# Patient Record
Sex: Female | Born: 1945
Health system: Southern US, Community
[De-identification: ages and names within clinical notes are randomized; demographics above are authoritative.]

## PROBLEM LIST (undated history)

## (undated) DIAGNOSIS — E039 Hypothyroidism, unspecified: Secondary | ICD-10-CM

## (undated) DIAGNOSIS — I499 Cardiac arrhythmia, unspecified: Secondary | ICD-10-CM

## (undated) DIAGNOSIS — I4891 Unspecified atrial fibrillation: Secondary | ICD-10-CM

## (undated) DIAGNOSIS — F329 Major depressive disorder, single episode, unspecified: Secondary | ICD-10-CM

## (undated) DIAGNOSIS — F411 Generalized anxiety disorder: Secondary | ICD-10-CM

## (undated) DIAGNOSIS — I10 Essential (primary) hypertension: Secondary | ICD-10-CM

## (undated) DIAGNOSIS — R0989 Other specified symptoms and signs involving the circulatory and respiratory systems: Secondary | ICD-10-CM

## (undated) DIAGNOSIS — E119 Type 2 diabetes mellitus without complications: Secondary | ICD-10-CM

## (undated) DIAGNOSIS — J40 Bronchitis, not specified as acute or chronic: Secondary | ICD-10-CM

## (undated) DIAGNOSIS — J449 Chronic obstructive pulmonary disease, unspecified: Secondary | ICD-10-CM

## (undated) DIAGNOSIS — F419 Anxiety disorder, unspecified: Secondary | ICD-10-CM

## (undated) DIAGNOSIS — M1990 Unspecified osteoarthritis, unspecified site: Secondary | ICD-10-CM

## (undated) DIAGNOSIS — H269 Unspecified cataract: Secondary | ICD-10-CM

## (undated) DIAGNOSIS — I509 Heart failure, unspecified: Secondary | ICD-10-CM

## (undated) DIAGNOSIS — G894 Chronic pain syndrome: Secondary | ICD-10-CM

## (undated) DIAGNOSIS — F32A Depression, unspecified: Secondary | ICD-10-CM

## (undated) DIAGNOSIS — R519 Headache, unspecified: Secondary | ICD-10-CM

## (undated) DIAGNOSIS — E785 Hyperlipidemia, unspecified: Secondary | ICD-10-CM

## (undated) DIAGNOSIS — Z973 Presence of spectacles and contact lenses: Secondary | ICD-10-CM

## (undated) DIAGNOSIS — N189 Chronic kidney disease, unspecified: Secondary | ICD-10-CM

## (undated) DIAGNOSIS — R0602 Shortness of breath: Secondary | ICD-10-CM

## (undated) DIAGNOSIS — Z96649 Presence of unspecified artificial hip joint: Secondary | ICD-10-CM

## (undated) DIAGNOSIS — D649 Anemia, unspecified: Secondary | ICD-10-CM

## (undated) DIAGNOSIS — K219 Gastro-esophageal reflux disease without esophagitis: Secondary | ICD-10-CM

## (undated) HISTORY — DX: Unspecified atrial fibrillation: I48.91

## (undated) HISTORY — DX: Other specified symptoms and signs involving the circulatory and respiratory systems: R09.89

## (undated) HISTORY — DX: Hyperlipidemia, unspecified: E78.5

## (undated) HISTORY — PX: BACK SURGERY: SHX140

## (undated) HISTORY — PX: COLONOSCOPY: SHX174

## (undated) HISTORY — PX: ABDOMINAL HYSTERECTOMY: SHX81

## (undated) HISTORY — PX: CHOLECYSTECTOMY: SHX55

## (undated) HISTORY — PX: NECK SURGERY: SHX720

---

## 2002-08-03 ENCOUNTER — Encounter: Payer: Self-pay | Admitting: Neurosurgery

## 2002-08-05 ENCOUNTER — Encounter: Payer: Self-pay | Admitting: Neurosurgery

## 2002-08-05 ENCOUNTER — Inpatient Hospital Stay (HOSPITAL_COMMUNITY): Admission: RE | Admit: 2002-08-05 | Discharge: 2002-08-07 | Payer: Self-pay | Admitting: Neurosurgery

## 2002-10-26 ENCOUNTER — Encounter: Payer: Self-pay | Admitting: Neurosurgery

## 2002-10-26 ENCOUNTER — Inpatient Hospital Stay (HOSPITAL_COMMUNITY): Admission: RE | Admit: 2002-10-26 | Discharge: 2002-10-27 | Payer: Self-pay | Admitting: Neurosurgery

## 2013-07-25 ENCOUNTER — Other Ambulatory Visit: Payer: Self-pay | Admitting: Orthopedic Surgery

## 2013-07-25 NOTE — Progress Notes (Signed)
Preoperative surgical orders have been place into the [facility] hospital system for Robin Brewer on 07/25/2013, 10:35 AM  by Mickel Crow for surgery on 08/12/13.  Preop Total Hip - Anterior Approach orders including Experel Injecion, PO Tylenol, and IV Decadron as long as there are no contraindications to the above medications. [name], PA-C

## 2013-07-29 ENCOUNTER — Encounter (HOSPITAL_COMMUNITY): Payer: Self-pay | Admitting: Pharmacy Technician

## 2013-08-02 ENCOUNTER — Other Ambulatory Visit: Payer: Self-pay | Admitting: Surgical

## 2013-08-02 NOTE — H&P (Signed)
TOTAL HIP ADMISSION H&P  Patient is admitted for right total hip arthroplasty.  Subjective:  Chief Complaint: right hip pain  HPI: Robin Brewer, 67 y.o. female, has a history of pain and functional disability in the right hip(s) due to arthritis and patient has failed non-surgical conservative treatments for greater than 12 weeks to include NSAID's and/or analgesics, corticosteriod injections, use of assistive devices and activity modification.  Onset of symptoms was gradual starting 3 years ago with gradually worsening course since that time.The patient noted no past surgery on the right hip(s).  Patient currently rates pain in the right hip at 6 out of 10 with activity. Patient has night pain, worsening of pain with activity and weight bearing, pain that interfers with activities of daily living and pain with passive range of motion. Patient has evidence of periarticular osteophytes and joint space narrowing by imaging studies. This condition presents safety issues increasing the risk of falls.   There is no current active infection.   Past Medical History Anxiety/Depression Hypertension Hypercholesteremia Hypothyroidism Osteoarthritis DDD Diabetes Mellitus type II Diverticulitis Peripheral Neuropathy  Past Surgical History Lumbar microdiscectomy Hysterectomy Cervical fusion   Current outpatient prescriptions: amLODipine-valsartan (EXFORGE) 10-320 MG per tablet, Take 1 tablet by mouth every evening., Disp: , Rfl: ;  atenolol-chlorthalidone (TENORETIC) 50-25 MG per tablet, Take 1 tablet by mouth every morning., Disp: , Rfl: ;  estrogens, conjugated, (PREMARIN) 0.625 MG tablet, Take 0.625 mg by mouth daily., Disp: , Rfl: ;  fenofibrate 160 MG tablet, Take 160 mg by mouth every morning., Disp: , Rfl:  gabapentin (NEURONTIN) 600 MG tablet, Take 900-1,200 mg by mouth 3 (three) times daily. TAKES 1 AND 1/2 TABLETS  2 TIMES DAILY AND 2 TABLETS AT BEDTIME, Disp: , Rfl: ;  glimepiride  (AMARYL) 4 MG tablet, Take 4 mg by mouth 2 (two) times daily., Disp: , Rfl: ;  insulin aspart (NOVOLOG) 100 UNIT/ML injection, Inject 10 Units into the skin 3 (three) times daily with meals., Disp: , Rfl:  insulin detemir (LEVEMIR) 100 UNIT/ML injection, Inject 40 Units into the skin daily., Disp: , Rfl: ;  levothyroxine (SYNTHROID, LEVOTHROID) 50 MCG tablet, Take 50 mcg by mouth daily before breakfast., Disp: , Rfl: ;  lisinopril (PRINIVIL,ZESTRIL) 40 MG tablet, Take 40 mg by mouth every evening., Disp: , Rfl: ;  oxymorphone (OPANA ER) 15 MG 12 hr tablet, Take 15 mg by mouth every 8 (eight) hours., Disp: , Rfl:  rosuvastatin (CRESTOR) 20 MG tablet, Take 20 mg by mouth every evening., Disp: , Rfl: ;  sertraline (ZOLOFT) 100 MG tablet, Take 100 mg by mouth every evening., Disp: , Rfl: ;  tiZANidine (ZANAFLEX) 4 MG tablet, Take 4-12 mg by mouth at bedtime. 1-3 TABLETS 1 HOUR PRIOR TO BEDTIME, Disp: , Rfl:   No Known Allergies  History  Substance Use Topics  . Smoking status: Former smoker  . Smokeless tobacco: None  . Alcohol Use: None    Family History Father deceased age 55 due to pulmonary hemorrhage, lung abscess, sepsis Mother deceased age due to heart disease  Review of Systems  Constitutional: Positive for malaise/fatigue and diaphoresis. Negative for fever, chills and weight loss.  HENT: Negative.  Negative for neck pain.   Eyes: Positive for blurred vision. Negative for double vision, photophobia, pain, discharge and redness.  Respiratory: Negative.   Cardiovascular: Negative.   Gastrointestinal: Positive for constipation. Negative for heartburn, nausea, vomiting, abdominal pain, diarrhea, blood in stool and melena.  Genitourinary: Positive for frequency. Negative  for dysuria, urgency, hematuria and flank pain.  Musculoskeletal: Positive for back pain and joint pain. Negative for myalgias and falls.  Skin: Negative.   Neurological: Positive for dizziness and weakness. Negative for  tingling, tremors, sensory change, speech change, focal weakness, seizures and loss of consciousness.  Endo/Heme/Allergies: Positive for environmental allergies. Negative for polydipsia. Does not bruise/bleed easily.  Psychiatric/Behavioral: Positive for depression. Negative for suicidal ideas, hallucinations, memory loss and substance abuse. The patient is nervous/anxious and has insomnia.     Objective:  Physical Exam  Constitutional: He is oriented to person, place, and time. He appears well-developed and well-nourished. No distress.  HENT:  Head: Normocephalic and atraumatic.  Right Ear: External ear normal.  Left Ear: External ear normal.  Nose: Nose normal.  Mouth/Throat: Oropharynx is clear and moist.  Eyes: Conjunctivae and EOM are normal.  Neck: Normal range of motion. Neck supple.  Cardiovascular: Normal rate, regular rhythm, normal heart sounds and intact distal pulses.   No murmur heard. Respiratory: Effort normal and breath sounds normal. No respiratory distress. He has no wheezes. He exhibits no tenderness.  GI: Soft. Bowel sounds are normal. He exhibits no distension and no mass. There is no tenderness.  Musculoskeletal:       Right hip: He exhibits decreased range of motion and decreased strength.       Left hip: Normal.       Right knee: Normal.       Left knee: Normal.       Right lower leg: He exhibits no tenderness and no swelling.       Left lower leg: He exhibits no tenderness and no swelling.  Her left hip shows normal range of motion with no discomfort. Right hip flexion 90, no internal or external rotation, no abduction or adduction. She has significant pain on any attempted range of motion of that right hip. Knee exam is normal. Sensation is slightly diminished lateral lower leg. Gait pattern is significantly antalgic on the right.  Lymphadenopathy:    He has no cervical adenopathy.  Neurological: He is oriented to person, place, and time. He has normal  strength and normal reflexes. A sensory deficit is present.  Skin: No rash noted. He is not diaphoretic. No erythema.  Psychiatric: He has a normal mood and affect. His behavior is normal.    Vitals( Weight: 170 lb Height: 62 in Body Surface Area: 1.84 m Body Mass Index: 31.09 kg/m Pulse: 72 (Regular) BP: 132/82 (Sitting, Left Arm, Standard)  Imaging Review Plain radiographs demonstrate severe degenerative joint disease of the right hip(s). The bone quality appears to be good for age and reported activity level.  Assessment/Plan:  End stage arthritis, right hip(s)  The patient history, physical examination, clinical judgement of the provider and imaging studies are consistent with end stage degenerative joint disease of the right hip(s) and total hip arthroplasty is deemed medically necessary. The treatment options including medical management, injection therapy, arthroscopy and arthroplasty were discussed at length. The risks and benefits of total hip arthroplasty were presented and reviewed. The risks due to aseptic loosening, infection, stiffness, dislocation/subluxation,  thromboembolic complications and other imponderables were discussed.  The patient acknowledged the explanation, agreed to proceed with the plan and consent was signed. Patient is being admitted for inpatient treatment for surgery, pain control, PT, OT, prophylactic antibiotics, VTE prophylaxis, progressive ambulation and ADL's and discharge planning.The patient is planning to be discharged home with home health services   [place], [place]

## 2013-08-05 NOTE — Patient Instructions (Signed)
Robin Brewer  08/05/2013   Your procedure is scheduled on: 08/12/13                Surgery 1200noon-200pm   Report to [facility] at     0930 AM.  Call this number if you have problems the morning of surgery: [phone]   Remember:   Do not eat food or drink liquids after midnight.   Take these medicines the morning of surgery with A SIP OF WATER:    Do not wear jewelry, make-up or nail polish.  Do not wear lotions, powders, or perfumes  Do not shave 48 hours prior to surgery.   Do not bring valuables to the hospital.  Contacts, dentures or bridgework may not be worn into surgery.  Leave suitcase in the car. After surgery it may be brought to your room.  For patients admitted to the hospital, checkout time is 11:00 AM the day of  discharge.     SEE CHG INSTRUCTION SHEET    Please read over the following fact sheets that you were given: MRSA Information, coughing and deep breathing exercises, leg exercises, Blood Transfusion Fact Sheet, Incentive Spirometry fact Sheet                Failure to comply with these instructions may result in cancellation of your surgery.                Patient Signature ____________________________              Nurse Signature _____________________________

## 2013-08-06 ENCOUNTER — Encounter (HOSPITAL_COMMUNITY)
Admission: RE | Admit: 2013-08-06 | Discharge: 2013-08-06 | Disposition: A | Discharge status: Home / Self Care | Payer: BC Managed Care – PPO | Source: Ambulatory Visit | Attending: Orthopedic Surgery | Admitting: Orthopedic Surgery

## 2013-08-06 ENCOUNTER — Encounter (HOSPITAL_COMMUNITY): Payer: Self-pay

## 2013-08-06 ENCOUNTER — Ambulatory Visit (HOSPITAL_COMMUNITY)
Admission: RE | Admit: 2013-08-06 | Discharge: 2013-08-06 | Disposition: A | Discharge status: Home / Self Care | Payer: BC Managed Care – PPO | Source: Ambulatory Visit | Attending: Orthopedic Surgery | Admitting: Orthopedic Surgery

## 2013-08-06 DIAGNOSIS — M1610 Unilateral primary osteoarthritis, unspecified hip: Secondary | ICD-10-CM | POA: Insufficient documentation

## 2013-08-06 DIAGNOSIS — Z01812 Encounter for preprocedural laboratory examination: Secondary | ICD-10-CM | POA: Insufficient documentation

## 2013-08-06 DIAGNOSIS — M169 Osteoarthritis of hip, unspecified: Secondary | ICD-10-CM | POA: Insufficient documentation

## 2013-08-06 DIAGNOSIS — Z01818 Encounter for other preprocedural examination: Secondary | ICD-10-CM | POA: Insufficient documentation

## 2013-08-06 HISTORY — DX: Anxiety disorder, unspecified: F41.9

## 2013-08-06 HISTORY — DX: Bronchitis, not specified as acute or chronic: J40

## 2013-08-06 HISTORY — DX: Unspecified osteoarthritis, unspecified site: M19.90

## 2013-08-06 HISTORY — DX: Essential (primary) hypertension: I10

## 2013-08-06 HISTORY — DX: Shortness of breath: R06.02

## 2013-08-06 HISTORY — DX: Depression, unspecified: F32.A

## 2013-08-06 HISTORY — DX: Major depressive disorder, single episode, unspecified: F32.9

## 2013-08-06 HISTORY — DX: Type 2 diabetes mellitus without complications: E11.9

## 2013-08-06 HISTORY — DX: Hypothyroidism, unspecified: E03.9

## 2013-08-06 LAB — SURGICAL PCR SCREEN
MRSA, PCR: NEGATIVE
Staphylococcus aureus: NEGATIVE

## 2013-08-06 LAB — URINALYSIS, ROUTINE W REFLEX MICROSCOPIC
Glucose, UA: NEGATIVE mg/dL
Hgb urine dipstick: NEGATIVE
Leukocytes, UA: NEGATIVE
Specific Gravity, Urine: 1.024 (ref 1.005–1.030)
pH: 5 (ref 5.0–8.0)

## 2013-08-06 LAB — URINE MICROSCOPIC-ADD ON

## 2013-08-06 NOTE — Progress Notes (Signed)
CXR 10/30/12 on chart  06/23/13 office visit note from Dr Geraldo Pitter on chart Stress Test done 06/25/13 on chart  Cath report 2008 on chart

## 2013-08-06 NOTE — Progress Notes (Addendum)
When patient came out of bathroom and sat back down in wheelchair patient bumped right arm at elbow on wheelchair and had area of broken skin with bleeding at right elbow.    Area washed with soap and water.  Betadine applied with Tegaderm dressing.  Patient states" My skin is real thin and I am real easy to do this. " .  Wheelchair taken out of service and patient placed in another wheelchair with no noted issues .  Facilities Management called to repair wheelchair.

## 2013-08-06 NOTE — Progress Notes (Signed)
Spoke with patient upon leaving Xray at approximately 1430pm.  Patient stated she was feeling fine .  Husband and son with patient.  Patient stated she was going to eat full meal.

## 2013-08-06 NOTE — Progress Notes (Signed)
Received EKG from office of Dr Delena Bali and placed on chart from 07/2013.

## 2013-08-06 NOTE — Progress Notes (Signed)
Called and spoke with patient's husband at home and husband stated they stopped and obtained food on way home and that wife was feeling fine.

## 2013-08-06 NOTE — Progress Notes (Signed)
Patient had episode of weakness at lab.  Patient given orange juice x 2  And peanut butter.   crackers. Patient states " I feel much better."   Husband and son with patient. Blood pressure of 109/40.  Pulse 54 and sat 99.  Instructed patient once hip xray complete for patient to eat full meal.  Patient, husband and son voice understanding.   Patient taken to xray and xray informed of incident and that patient had been given orange juice and crackers.  Noted also on routing sheet.

## 2013-08-06 NOTE — Progress Notes (Signed)
Called and requested most recent ekg done at office of Dr Nelda [phone]) patient stated she had EKG done there on 08/05/13.  To be faxed to [facility] for surgery.  Office stated they would send.

## 2013-08-06 NOTE — Progress Notes (Signed)
CBC, CMP and urinalysis faxed via EPIC to Dr Wynelle Link.

## 2013-08-12 ENCOUNTER — Inpatient Hospital Stay (HOSPITAL_COMMUNITY)
Admission: RE | Admit: 2013-08-12 | Discharge: 2013-08-15 | DRG: 818 | Disposition: A | Discharge status: Home Health | Payer: BC Managed Care – PPO | Source: Ambulatory Visit | Attending: Orthopedic Surgery | Admitting: Orthopedic Surgery

## 2013-08-12 ENCOUNTER — Encounter (HOSPITAL_COMMUNITY): Payer: Self-pay | Admitting: *Deleted

## 2013-08-12 ENCOUNTER — Inpatient Hospital Stay (HOSPITAL_COMMUNITY): Payer: BC Managed Care – PPO

## 2013-08-12 ENCOUNTER — Inpatient Hospital Stay (HOSPITAL_COMMUNITY): Payer: BC Managed Care – PPO | Admitting: Registered Nurse

## 2013-08-12 ENCOUNTER — Encounter (HOSPITAL_COMMUNITY)
Admission: RE | Disposition: A | Discharge status: Home Health | Payer: Self-pay | Source: Ambulatory Visit | Attending: Orthopedic Surgery

## 2013-08-12 ENCOUNTER — Encounter (HOSPITAL_COMMUNITY): Payer: Self-pay | Admitting: Registered Nurse

## 2013-08-12 DIAGNOSIS — Z981 Arthrodesis status: Secondary | ICD-10-CM

## 2013-08-12 DIAGNOSIS — F3289 Other specified depressive episodes: Secondary | ICD-10-CM | POA: Diagnosis present

## 2013-08-12 DIAGNOSIS — Z87891 Personal history of nicotine dependence: Secondary | ICD-10-CM

## 2013-08-12 DIAGNOSIS — M169 Osteoarthritis of hip, unspecified: Secondary | ICD-10-CM

## 2013-08-12 DIAGNOSIS — E876 Hypokalemia: Secondary | ICD-10-CM | POA: Diagnosis not present

## 2013-08-12 DIAGNOSIS — F329 Major depressive disorder, single episode, unspecified: Secondary | ICD-10-CM | POA: Diagnosis present

## 2013-08-12 DIAGNOSIS — E119 Type 2 diabetes mellitus without complications: Secondary | ICD-10-CM | POA: Diagnosis present

## 2013-08-12 DIAGNOSIS — E7800 Pure hypercholesterolemia, unspecified: Secondary | ICD-10-CM | POA: Diagnosis present

## 2013-08-12 DIAGNOSIS — M1610 Unilateral primary osteoarthritis, unspecified hip: Principal | ICD-10-CM | POA: Diagnosis present

## 2013-08-12 DIAGNOSIS — Z96649 Presence of unspecified artificial hip joint: Secondary | ICD-10-CM

## 2013-08-12 DIAGNOSIS — E039 Hypothyroidism, unspecified: Secondary | ICD-10-CM | POA: Diagnosis present

## 2013-08-12 DIAGNOSIS — D62 Acute posthemorrhagic anemia: Secondary | ICD-10-CM | POA: Diagnosis not present

## 2013-08-12 DIAGNOSIS — I10 Essential (primary) hypertension: Secondary | ICD-10-CM | POA: Diagnosis present

## 2013-08-12 HISTORY — PX: TOTAL HIP ARTHROPLASTY: SHX124

## 2013-08-12 HISTORY — DX: Osteoarthritis of hip, unspecified: M16.9

## 2013-08-12 LAB — COMPREHENSIVE METABOLIC PANEL
ALT: 13 U/L (ref 0–35)
Alkaline Phosphatase: 69 U/L (ref 39–117)
CO2: 31 mEq/L (ref 19–32)
Calcium: 9.4 mg/dL (ref 8.4–10.5)
Chloride: 101 mEq/L (ref 96–112)
GFR calc Af Amer: 55 mL/min — ABNORMAL LOW (ref >90)
GFR calc non Af Amer: 48 mL/min — ABNORMAL LOW (ref >90)
Glucose, Bld: 74 mg/dL (ref 70–99)
Sodium: 141 mEq/L (ref 135–145)
Total Bilirubin: 0.2 mg/dL — ABNORMAL LOW (ref 0.3–1.2)

## 2013-08-12 LAB — CBC
Hemoglobin: 12.9 g/dL (ref 12.0–15.0)
MCH: 25.9 pg — ABNORMAL LOW (ref 26.0–34.0)
RBC: 4.99 MIL/uL (ref 3.87–5.11)

## 2013-08-12 LAB — TYPE AND SCREEN: ABO/RH(D): O POS

## 2013-08-12 LAB — GLUCOSE, CAPILLARY: Glucose-Capillary: 125 mg/dL — ABNORMAL HIGH (ref 70–99)

## 2013-08-12 SURGERY — ARTHROPLASTY, HIP, TOTAL, ANTERIOR APPROACH
Anesthesia: Spinal | Site: Hip | Laterality: Right | Wound class: Clean

## 2013-08-12 MED ORDER — CHLORTHALIDONE 25 MG PO TABS
25.0000 mg | ORAL_TABLET | Freq: Every day | ORAL | Status: DC
  Administered 2013-08-13 – 2013-08-14 (×2): 25 mg via ORAL
  Filled 2013-08-12 (×4): qty 1

## 2013-08-12 MED ORDER — PHENYLEPHRINE HCL 10 MG/ML IJ SOLN
INTRAMUSCULAR | Status: DC | PRN
  Administered 2013-08-12: 160 ug via INTRAVENOUS
  Administered 2013-08-12: 80 ug via INTRAVENOUS

## 2013-08-12 MED ORDER — LACTATED RINGERS IV SOLN
INTRAVENOUS | Status: DC
  Administered 2013-08-12: 1000 mL via INTRAVENOUS
  Administered 2013-08-12: 13:00:00 via INTRAVENOUS

## 2013-08-12 MED ORDER — BUPIVACAINE HCL (PF) 0.5 % IJ SOLN
INTRAMUSCULAR | Status: DC | PRN
  Administered 2013-08-12: 3 mL

## 2013-08-12 MED ORDER — PROPOFOL 10 MG/ML IV BOLUS
INTRAVENOUS | Status: DC | PRN
  Administered 2013-08-12: 30 mg via INTRAVENOUS

## 2013-08-12 MED ORDER — MIDAZOLAM HCL 5 MG/5ML IJ SOLN
INTRAMUSCULAR | Status: DC | PRN
  Administered 2013-08-12: 2 mg via INTRAVENOUS

## 2013-08-12 MED ORDER — ACETAMINOPHEN 500 MG PO TABS
1000.0000 mg | ORAL_TABLET | Freq: Four times a day (QID) | ORAL | Status: AC
  Administered 2013-08-12: 1000 mg via ORAL
  Filled 2013-08-12: qty 2

## 2013-08-12 MED ORDER — ATENOLOL 50 MG PO TABS
50.0000 mg | ORAL_TABLET | Freq: Once | ORAL | Status: DC
  Filled 2013-08-12: qty 1

## 2013-08-12 MED ORDER — GABAPENTIN 400 MG PO CAPS
1200.0000 mg | ORAL_CAPSULE | Freq: Every day | ORAL | Status: DC
  Administered 2013-08-12 – 2013-08-14 (×3): 1200 mg via ORAL
  Filled 2013-08-12 (×4): qty 3

## 2013-08-12 MED ORDER — POTASSIUM CHLORIDE IN NACL 20-0.45 MEQ/L-% IV SOLN
INTRAVENOUS | Status: DC
  Administered 2013-08-12 – 2013-08-13 (×2): via INTRAVENOUS
  Filled 2013-08-12 (×3): qty 1000

## 2013-08-12 MED ORDER — RIVAROXABAN 10 MG PO TABS
10.0000 mg | ORAL_TABLET | Freq: Every day | ORAL | Status: DC
  Administered 2013-08-13 – 2013-08-15 (×3): 10 mg via ORAL
  Filled 2013-08-12 (×5): qty 1

## 2013-08-12 MED ORDER — CEFAZOLIN SODIUM 1-5 GM-% IV SOLN
1.0000 g | Freq: Four times a day (QID) | INTRAVENOUS | Status: AC
  Administered 2013-08-12 – 2013-08-13 (×2): 1 g via INTRAVENOUS
  Filled 2013-08-12 (×2): qty 50

## 2013-08-12 MED ORDER — SERTRALINE HCL 100 MG PO TABS
100.0000 mg | ORAL_TABLET | Freq: Every evening | ORAL | Status: DC
  Administered 2013-08-12 – 2013-08-14 (×3): 100 mg via ORAL
  Filled 2013-08-12 (×4): qty 1

## 2013-08-12 MED ORDER — PROMETHAZINE HCL 25 MG/ML IJ SOLN: 6.2500 mg | INTRAMUSCULAR | Status: DC | PRN

## 2013-08-12 MED ORDER — GABAPENTIN 600 MG PO TABS: 900.0000 mg | ORAL_TABLET | Freq: Three times a day (TID) | ORAL | Status: DC

## 2013-08-12 MED ORDER — METOCLOPRAMIDE HCL 5 MG/ML IJ SOLN: 5.0000 mg | Freq: Three times a day (TID) | INTRAMUSCULAR | Status: DC | PRN

## 2013-08-12 MED ORDER — METHOCARBAMOL 100 MG/ML IJ SOLN
500.0000 mg | Freq: Four times a day (QID) | INTRAVENOUS | Status: DC | PRN
  Administered 2013-08-12: 500 mg via INTRAVENOUS
  Filled 2013-08-12: qty 5

## 2013-08-12 MED ORDER — LUBIPROSTONE 24 MCG PO CAPS
24.0000 ug | ORAL_CAPSULE | Freq: Two times a day (BID) | ORAL | Status: DC | PRN
  Filled 2013-08-12: qty 1

## 2013-08-12 MED ORDER — AMLODIPINE BESYLATE 10 MG PO TABS
10.0000 mg | ORAL_TABLET | Freq: Every day | ORAL | Status: DC
  Administered 2013-08-12 – 2013-08-15 (×4): 10 mg via ORAL
  Filled 2013-08-12 (×4): qty 1

## 2013-08-12 MED ORDER — ATENOLOL 50 MG PO TABS: 50.0000 mg | ORAL_TABLET | Freq: Every day | ORAL | Status: DC

## 2013-08-12 MED ORDER — ACETAMINOPHEN 500 MG PO TABS
1000.0000 mg | ORAL_TABLET | Freq: Once | ORAL | Status: AC
  Administered 2013-08-12: 1000 mg via ORAL
  Filled 2013-08-12: qty 2

## 2013-08-12 MED ORDER — FLEET ENEMA 7-19 GM/118ML RE ENEM: 1.0000 | ENEMA | Freq: Once | RECTAL | Status: AC | PRN

## 2013-08-12 MED ORDER — INSULIN ASPART 100 UNIT/ML ~~LOC~~ SOLN
0.0000 [IU] | Freq: Three times a day (TID) | SUBCUTANEOUS | Status: DC
  Administered 2013-08-12: 8 [IU] via SUBCUTANEOUS
  Administered 2013-08-13: 1 [IU] via SUBCUTANEOUS
  Administered 2013-08-13: 11 [IU] via SUBCUTANEOUS
  Administered 2013-08-13: 3 [IU] via SUBCUTANEOUS
  Administered 2013-08-14: 5 [IU] via SUBCUTANEOUS
  Administered 2013-08-14 – 2013-08-15 (×2): 2 [IU] via SUBCUTANEOUS

## 2013-08-12 MED ORDER — IRBESARTAN 300 MG PO TABS
300.0000 mg | ORAL_TABLET | Freq: Every day | ORAL | Status: DC
  Administered 2013-08-12 – 2013-08-14 (×3): 300 mg via ORAL
  Filled 2013-08-12 (×4): qty 1

## 2013-08-12 MED ORDER — INSULIN ASPART 100 UNIT/ML ~~LOC~~ SOLN
12.0000 [IU] | Freq: Three times a day (TID) | SUBCUTANEOUS | Status: DC
  Administered 2013-08-13 – 2013-08-15 (×7): 12 [IU] via SUBCUTANEOUS

## 2013-08-12 MED ORDER — FENOFIBRATE 160 MG PO TABS
160.0000 mg | ORAL_TABLET | Freq: Every morning | ORAL | Status: DC
  Administered 2013-08-13 – 2013-08-15 (×3): 160 mg via ORAL
  Filled 2013-08-12 (×3): qty 1

## 2013-08-12 MED ORDER — OXYCODONE HCL 5 MG PO TABS
5.0000 mg | ORAL_TABLET | ORAL | Status: DC | PRN
  Administered 2013-08-12 – 2013-08-15 (×12): 10 mg via ORAL
  Filled 2013-08-12 (×12): qty 2

## 2013-08-12 MED ORDER — ATENOLOL-CHLORTHALIDONE 50-25 MG PO TABS: 1.0000 | ORAL_TABLET | Freq: Every morning | ORAL | Status: DC

## 2013-08-12 MED ORDER — CEFAZOLIN SODIUM-DEXTROSE 2-3 GM-% IV SOLR
2.0000 g | INTRAVENOUS | Status: AC
  Administered 2013-08-12: 2 g via INTRAVENOUS

## 2013-08-12 MED ORDER — SODIUM CHLORIDE 0.9 % IJ SOLN
INTRAMUSCULAR | Status: DC | PRN
  Administered 2013-08-12: 13:00:00

## 2013-08-12 MED ORDER — PROPOFOL INFUSION 10 MG/ML OPTIME
INTRAVENOUS | Status: DC | PRN
  Administered 2013-08-12: 75 ug/kg/min via INTRAVENOUS

## 2013-08-12 MED ORDER — INSULIN DETEMIR 100 UNIT/ML ~~LOC~~ SOLN
50.0000 [IU] | Freq: Every day | SUBCUTANEOUS | Status: DC
  Administered 2013-08-13 – 2013-08-15 (×3): 50 [IU] via SUBCUTANEOUS
  Filled 2013-08-12 (×3): qty 0.5

## 2013-08-12 MED ORDER — POLYETHYLENE GLYCOL 3350 17 G PO PACK: 17.0000 g | PACK | Freq: Every day | ORAL | Status: DC | PRN

## 2013-08-12 MED ORDER — MORPHINE SULFATE 2 MG/ML IJ SOLN
1.0000 mg | INTRAMUSCULAR | Status: DC | PRN
  Administered 2013-08-12 (×2): 1 mg via INTRAVENOUS
  Administered 2013-08-13: 2 mg via INTRAVENOUS
  Filled 2013-08-12 (×3): qty 1

## 2013-08-12 MED ORDER — BUPIVACAINE LIPOSOME 1.3 % IJ SUSP
20.0000 mL | Freq: Once | INTRAMUSCULAR | Status: DC
  Filled 2013-08-12: qty 20

## 2013-08-12 MED ORDER — ONDANSETRON HCL 4 MG/2ML IJ SOLN: 4.0000 mg | Freq: Four times a day (QID) | INTRAMUSCULAR | Status: DC | PRN

## 2013-08-12 MED ORDER — DEXAMETHASONE SODIUM PHOSPHATE 10 MG/ML IJ SOLN
10.0000 mg | Freq: Once | INTRAMUSCULAR | Status: AC
  Administered 2013-08-12: 10 mg via INTRAVENOUS

## 2013-08-12 MED ORDER — MORPHINE SULFATE ER 30 MG PO TBCR
30.0000 mg | EXTENDED_RELEASE_TABLET | Freq: Two times a day (BID) | ORAL | Status: DC
  Administered 2013-08-12 – 2013-08-15 (×6): 30 mg via ORAL
  Filled 2013-08-12 (×6): qty 1

## 2013-08-12 MED ORDER — AMLODIPINE BESYLATE 10 MG PO TABS: 10.0000 mg | ORAL_TABLET | Freq: Every day | ORAL | Status: DC

## 2013-08-12 MED ORDER — METOCLOPRAMIDE HCL 10 MG PO TABS: 5.0000 mg | ORAL_TABLET | Freq: Three times a day (TID) | ORAL | Status: DC | PRN

## 2013-08-12 MED ORDER — ALPRAZOLAM 0.25 MG PO TABS
0.2500 mg | ORAL_TABLET | Freq: Three times a day (TID) | ORAL | Status: DC | PRN
  Administered 2013-08-12 – 2013-08-14 (×4): 0.25 mg via ORAL
  Filled 2013-08-12 (×4): qty 1

## 2013-08-12 MED ORDER — CHLORHEXIDINE GLUCONATE 4 % EX LIQD
60.0000 mL | Freq: Once | CUTANEOUS | Status: DC
  Filled 2013-08-12: qty 60

## 2013-08-12 MED ORDER — GABAPENTIN 300 MG PO CAPS
900.0000 mg | ORAL_CAPSULE | Freq: Two times a day (BID) | ORAL | Status: DC
  Administered 2013-08-13 – 2013-08-15 (×4): 900 mg via ORAL
  Filled 2013-08-12 (×8): qty 3

## 2013-08-12 MED ORDER — ONDANSETRON HCL 4 MG PO TABS: 4.0000 mg | ORAL_TABLET | Freq: Four times a day (QID) | ORAL | Status: DC | PRN

## 2013-08-12 MED ORDER — 0.9 % SODIUM CHLORIDE (POUR BTL) OPTIME
TOPICAL | Status: DC | PRN
  Administered 2013-08-12: 1000 mL

## 2013-08-12 MED ORDER — IRBESARTAN 300 MG PO TABS: 300.0000 mg | ORAL_TABLET | Freq: Every day | ORAL | Status: DC

## 2013-08-12 MED ORDER — MENTHOL 3 MG MT LOZG: 1.0000 | LOZENGE | OROMUCOSAL | Status: DC | PRN

## 2013-08-12 MED ORDER — ALBUTEROL SULFATE HFA 108 (90 BASE) MCG/ACT IN AERS
2.0000 | INHALATION_SPRAY | Freq: Four times a day (QID) | RESPIRATORY_TRACT | Status: DC | PRN
  Filled 2013-08-12: qty 6.7

## 2013-08-12 MED ORDER — GLIMEPIRIDE 4 MG PO TABS
4.0000 mg | ORAL_TABLET | Freq: Two times a day (BID) | ORAL | Status: DC
  Administered 2013-08-13 – 2013-08-15 (×5): 4 mg via ORAL
  Filled 2013-08-12 (×8): qty 1

## 2013-08-12 MED ORDER — ATENOLOL 50 MG PO TABS
50.0000 mg | ORAL_TABLET | Freq: Every day | ORAL | Status: DC
  Administered 2013-08-13 – 2013-08-15 (×3): 50 mg via ORAL
  Filled 2013-08-12 (×4): qty 1

## 2013-08-12 MED ORDER — FENTANYL CITRATE 0.05 MG/ML IJ SOLN
INTRAMUSCULAR | Status: DC | PRN
  Administered 2013-08-12: 50 ug via INTRAVENOUS

## 2013-08-12 MED ORDER — PHENOL 1.4 % MT LIQD: 1.0000 | OROMUCOSAL | Status: DC | PRN

## 2013-08-12 MED ORDER — BUPIVACAINE HCL 0.25 % IJ SOLN
INTRAMUSCULAR | Status: DC | PRN
  Administered 2013-08-12: 20 mL

## 2013-08-12 MED ORDER — DEXAMETHASONE SODIUM PHOSPHATE 10 MG/ML IJ SOLN
10.0000 mg | Freq: Every day | INTRAMUSCULAR | Status: AC
  Filled 2013-08-12: qty 1

## 2013-08-12 MED ORDER — DEXAMETHASONE 6 MG PO TABS
10.0000 mg | ORAL_TABLET | Freq: Every day | ORAL | Status: AC
  Administered 2013-08-13: 10 mg via ORAL
  Filled 2013-08-12: qty 1

## 2013-08-12 MED ORDER — DOCUSATE SODIUM 100 MG PO CAPS
100.0000 mg | ORAL_CAPSULE | Freq: Two times a day (BID) | ORAL | Status: DC
  Administered 2013-08-12 – 2013-08-15 (×6): 100 mg via ORAL

## 2013-08-12 MED ORDER — CHLORTHALIDONE 25 MG PO TABS: 25.0000 mg | ORAL_TABLET | Freq: Every day | ORAL | Status: DC

## 2013-08-12 MED ORDER — LEVOTHYROXINE SODIUM 50 MCG PO TABS
50.0000 ug | ORAL_TABLET | Freq: Every day | ORAL | Status: DC
  Administered 2013-08-13 – 2013-08-15 (×3): 50 ug via ORAL
  Filled 2013-08-12 (×5): qty 1

## 2013-08-12 MED ORDER — SODIUM CHLORIDE 0.9 % IV SOLN: INTRAVENOUS | Status: DC

## 2013-08-12 MED ORDER — FENTANYL CITRATE 0.05 MG/ML IJ SOLN
25.0000 ug | INTRAMUSCULAR | Status: DC | PRN
  Administered 2013-08-12 (×2): 50 ug via INTRAVENOUS

## 2013-08-12 MED ORDER — AMLODIPINE BESYLATE-VALSARTAN 10-320 MG PO TABS: 1.0000 | ORAL_TABLET | Freq: Every evening | ORAL | Status: DC

## 2013-08-12 MED ORDER — METHOCARBAMOL 500 MG PO TABS
500.0000 mg | ORAL_TABLET | Freq: Four times a day (QID) | ORAL | Status: DC | PRN
  Administered 2013-08-12 – 2013-08-14 (×5): 500 mg via ORAL
  Filled 2013-08-12 (×5): qty 1

## 2013-08-12 MED ORDER — TRAMADOL HCL 50 MG PO TABS
50.0000 mg | ORAL_TABLET | Freq: Four times a day (QID) | ORAL | Status: DC | PRN
  Administered 2013-08-13 – 2013-08-15 (×3): 100 mg via ORAL
  Filled 2013-08-12 (×3): qty 2

## 2013-08-12 MED ORDER — TRANEXAMIC ACID 100 MG/ML IV SOLN
1000.0000 mg | INTRAVENOUS | Status: AC
  Administered 2013-08-12: 1000 mg via INTRAVENOUS
  Filled 2013-08-12: qty 10

## 2013-08-12 MED ORDER — MEPERIDINE HCL 50 MG/ML IJ SOLN: 6.2500 mg | INTRAMUSCULAR | Status: DC | PRN

## 2013-08-12 MED ORDER — BISACODYL 10 MG RE SUPP: 10.0000 mg | Freq: Every day | RECTAL | Status: DC | PRN

## 2013-08-12 MED ORDER — DIPHENHYDRAMINE HCL 12.5 MG/5ML PO ELIX: 12.5000 mg | ORAL_SOLUTION | ORAL | Status: DC | PRN

## 2013-08-12 MED ORDER — CLONIDINE HCL 0.1 MG PO TABS
0.1000 mg | ORAL_TABLET | Freq: Two times a day (BID) | ORAL | Status: DC | PRN
  Administered 2013-08-12: 0.1 mg via ORAL
  Filled 2013-08-12 (×2): qty 1

## 2013-08-12 SURGICAL SUPPLY — 40 items
BAG ZIPLOCK 12X15 (MISCELLANEOUS) ×2 IMPLANT
BLADE SAW SGTL 18X1.27X75 (BLADE) ×2 IMPLANT
CAPT HIP PF COP ×2 IMPLANT
CLOTH BEACON ORANGE TIMEOUT ST (SAFETY) ×2 IMPLANT
DECANTER SPIKE VIAL GLASS SM (MISCELLANEOUS) ×2 IMPLANT
DRAPE C-ARM 42X120 X-RAY (DRAPES) ×2 IMPLANT
DRAPE STERI IOBAN 125X83 (DRAPES) ×2 IMPLANT
DRAPE U-SHAPE 47X51 STRL (DRAPES) ×6 IMPLANT
DRSG ADAPTIC 3X8 NADH LF (GAUZE/BANDAGES/DRESSINGS) ×2 IMPLANT
DRSG MEPILEX BORDER 4X4 (GAUZE/BANDAGES/DRESSINGS) ×2 IMPLANT
DRSG MEPILEX BORDER 4X8 (GAUZE/BANDAGES/DRESSINGS) ×2 IMPLANT
DURAPREP 26ML APPLICATOR (WOUND CARE) ×2 IMPLANT
ELECT BLADE 6.5 EXT (BLADE) ×2 IMPLANT
ELECT REM PT RETURN 9FT ADLT (ELECTROSURGICAL) ×2
ELECTRODE REM PT RTRN 9FT ADLT (ELECTROSURGICAL) ×1 IMPLANT
EVACUATOR 1/8 PVC DRAIN (DRAIN) IMPLANT
FACESHIELD LNG OPTICON STERILE (SAFETY) ×8 IMPLANT
GLOVE BIO SURGEON STRL SZ7.5 (GLOVE) ×2 IMPLANT
GLOVE BIO SURGEON STRL SZ8 (GLOVE) ×4 IMPLANT
GLOVE BIOGEL PI IND STRL 8 (GLOVE) ×2 IMPLANT
GLOVE BIOGEL PI INDICATOR 8 (GLOVE) ×2
GOWN STRL NON-REIN LRG LVL3 (GOWN DISPOSABLE) ×2 IMPLANT
GOWN STRL REIN XL XLG (GOWN DISPOSABLE) ×4 IMPLANT
KIT BASIN OR (CUSTOM PROCEDURE TRAY) ×2 IMPLANT
NDL SAFETY ECLIPSE 18X1.5 (NEEDLE) ×2 IMPLANT
NEEDLE HYPO 18GX1.5 SHARP (NEEDLE) ×2
PACK TOTAL JOINT (CUSTOM PROCEDURE TRAY) ×2 IMPLANT
PADDING CAST COTTON 6X4 STRL (CAST SUPPLIES) ×2 IMPLANT
SUCTION FRAZIER 12FR DISP (SUCTIONS) ×2 IMPLANT
SUT ETHIBOND NAB CT1 #1 30IN (SUTURE) ×2 IMPLANT
SUT MNCRL AB 4-0 PS2 18 (SUTURE) ×2 IMPLANT
SUT VIC AB 1 CT1 27 (SUTURE) ×1
SUT VIC AB 1 CT1 27XBRD ANTBC (SUTURE) ×1 IMPLANT
SUT VIC AB 2-0 CT1 27 (SUTURE) ×2
SUT VIC AB 2-0 CT1 TAPERPNT 27 (SUTURE) ×2 IMPLANT
SUT VLOC 180 0 24IN GS25 (SUTURE) ×2 IMPLANT
SYR 20CC LL (SYRINGE) ×2 IMPLANT
SYR 50ML LL SCALE MARK (SYRINGE) ×2 IMPLANT
TOWEL OR 17X26 10 PK STRL BLUE (TOWEL DISPOSABLE) ×2 IMPLANT
TRAY FOLEY CATH 14FRSI W/METER (CATHETERS) ×2 IMPLANT

## 2013-08-12 NOTE — Anesthesia Procedure Notes (Addendum)
Spinal   Spinal  End time: 08/12/2013 12:19 PM Preanesthetic Checklist Completed: patient identified, site marked, surgical consent, pre-op evaluation, IV checked, risks and benefits discussed and monitors and equipment checked Spinal Block Patient position: sitting Prep: Betadine Patient monitoring: heart rate, continuous pulse ox and blood pressure Approach: right paramedian Location: L3-4 Injection technique: single-shot Needle Needle type: Spinocan  Needle gauge: 22 G Assessment Sensory level: T6 Additional Notes Pt tolerated procedure well. CSF x 3. Spinal tray and kit within date.Negative heme, negative paresthesia

## 2013-08-12 NOTE — Progress Notes (Signed)
Utilization review completed.  

## 2013-08-12 NOTE — Transfer of Care (Signed)
Immediate Anesthesia Transfer of Care Note  Patient: Robin Brewer  Procedure(s) Performed: Procedure(s): RIGHT TOTAL HIP ARTHROPLASTY ANTERIOR APPROACH (Right)  Patient Location: PACU  Anesthesia Type:Spinal  Level of Consciousness: awake and oriented  Airway & Oxygen Therapy: Patient Spontanous Breathing and Patient connected to face mask oxygen  Post-op Assessment: Report given to PACU RN and Post -op Vital signs reviewed and stable  Post vital signs: Reviewed and stable  Complications: No apparent anesthesia complications

## 2013-08-12 NOTE — Anesthesia Postprocedure Evaluation (Signed)
  Anesthesia Post-op Note  Patient: Robin Brewer  Procedure(s) Performed: Procedure(s) (LRB): RIGHT TOTAL HIP ARTHROPLASTY ANTERIOR APPROACH (Right)  Patient Location: PACU  Anesthesia Type: Spinal  Level of Consciousness: awake and alert   Airway and Oxygen Therapy: Patient Spontanous Breathing  Post-op Pain: mild  Post-op Assessment: Post-op Vital signs reviewed, Patient's Cardiovascular Status Stable, Respiratory Function Stable, Patent Airway and No signs of Nausea or vomiting  Last Vitals:  Filed Vitals:   08/12/13 1430  BP: 158/60  Pulse: 57  Temp:   Resp: 13    Post-op Vital Signs: stable   Complications: No apparent anesthesia complications

## 2013-08-12 NOTE — Anesthesia Preprocedure Evaluation (Signed)
Anesthesia Evaluation  Patient identified by MRN, date of birth, ID band Patient awake    Reviewed: Allergy & Precautions, H&P , NPO status , Patient's Chart, lab work & pertinent test results, reviewed documented beta blocker date and time   Airway Mallampati: III TM Distance: >3 FB Neck ROM: Full    Dental no notable dental hx.    Pulmonary neg pulmonary ROS,  breath sounds clear to auscultation  Pulmonary exam normal       Cardiovascular hypertension, Pt. on medications negative cardio ROS  Rhythm:Regular Rate:Normal  Beta blocker held for bradycardia   Neuro/Psych negative neurological ROS  negative psych ROS   GI/Hepatic negative GI ROS, Neg liver ROS,   Endo/Other  negative endocrine ROSdiabetes, Insulin Dependent and Oral Hypoglycemic AgentsHypothyroidism   Renal/GU negative Renal ROS  negative genitourinary   Musculoskeletal negative musculoskeletal ROS (+)   Abdominal   Peds negative pediatric ROS (+)  Hematology negative hematology ROS (+)   Anesthesia Other Findings   Reproductive/Obstetrics negative OB ROS                           Anesthesia Physical Anesthesia Plan  ASA: III  Anesthesia Plan: Spinal   Post-op Pain Management:    Induction:   Airway Management Planned: Simple Face Mask  Additional Equipment:   Intra-op Plan:   Post-operative Plan:   Informed Consent: I have reviewed the patients History and Physical, chart, labs and discussed the procedure including the risks, benefits and alternatives for the proposed anesthesia with the patient or authorized representative who has indicated his/her understanding and acceptance.   Dental advisory given  Plan Discussed with: CRNA  Anesthesia Plan Comments: (Beta blocker held for bradycardia)        Anesthesia Quick Evaluation

## 2013-08-12 NOTE — Interval H&P Note (Signed)
History and Physical Interval Note:  08/12/2013 11:25 AM  Robin Brewer  has presented today for surgery, with the diagnosis of OSTEOARTHRITIS RIGHT HIP   The various methods of treatment have been discussed with the patient and family. After consideration of risks, benefits and other options for treatment, the patient has consented to  Procedure(s): RIGHT TOTAL HIP ARTHROPLASTY ANTERIOR APPROACH (Right) as a surgical intervention .  The patient's history has been reviewed, patient examined, no change in status, stable for surgery.  I have reviewed the patient's chart and labs.  Questions were answered to the patient's satisfaction.     [name]

## 2013-08-12 NOTE — Progress Notes (Signed)
Omit Atenolol today per Dr. Marcell Barlow. Pulse 57

## 2013-08-12 NOTE — Op Note (Signed)
OPERATIVE REPORT  PREOPERATIVE DIAGNOSIS: Osteoarthritis of the Right hip.   POSTOPERATIVE DIAGNOSIS: Osteoarthritis of the Right  hip.   PROCEDURE: Right total hip arthroplasty, anterior approach.   SURGEON: Gaynelle Arabian, MD   ASSISTANT: [name], PA-C  ANESTHESIA:  Spinal  ESTIMATED BLOOD LOSS:- 150 ml  DRAINS: Hemovac x1.   COMPLICATIONS: None   CONDITION: PACU - hemodynamically stable.   BRIEF CLINICAL NOTE: Robin Brewer is a 67 y.o. female who has advanced end-  stage arthritis of his Right  hip with progressively worsening pain and  dysfunction.The patient has failed nonoperative management and presents for  total hip arthroplasty.   PROCEDURE IN DETAIL: After successful administration of spinal  anesthetic, the traction boots for the [facility] bed were placed on both  feet and the patient was placed onto the [facility] bed, boots placed into the leg  holders. The Right hip was then isolated from the perineum with plastic  drapes and prepped and draped in the usual sterile fashion. ASIS and  greater trochanter were marked and a oblique incision was made, starting  at about 1 cm lateral and 2 cm distal to the ASIS and coursing towards  the anterior cortex of the femur. The skin was cut with a 10 blade  through subcutaneous tissue to the level of the fascia overlying the  tensor fascia lata muscle. The fascia was then incised in line with the  incision at the junction of the anterior third and posterior 2/3rd. The  muscle was teased off the fascia and then the interval between the TFL  and the rectus was developed. The Hohmann retractor was then placed at  the top of the femoral neck over the capsule. The vessels overlying the  capsule were cauterized and the fat on top of the capsule was removed.  A Hohmann retractor was then placed anterior underneath the rectus  femoris to give exposure to the entire anterior capsule. A T-shaped  capsulotomy was performed.  The edges were tagged and the femoral head  was identified.       Osteophytes are removed off the superior acetabulum.  The femoral neck was then cut in situ with an oscillating saw. Traction  was then applied to the left lower extremity utilizing the [facility]  traction. The femoral head was then removed. Retractors were placed  around the acetabulum and then circumferential removal of the labrum was  performed. Osteophytes were also removed. Reaming starts at 45 mm to  medialize and  Increased in 2 mm increments to 47 mm. We reamed in  approximately 40 degrees of abduction, 20 degrees anteversion. A 48 mm  pinnacle acetabular shell was then impacted in anatomic position under  fluoroscopic guidance with excellent purchase. We did not need to place  any additional dome screws. A 28 mm neutral + 4 marathon liner was then  placed into the acetabular shell.       The femoral lift was then placed along the lateral aspect of the femur  just distal to the vastus ridge. The leg was  externally rotated and capsule  was stripped off the inferior aspect of the femoral neck down to the  level of the lesser trochanter, this was done with electrocautery. The femur was lifted after this was performed. The  leg was then placed and extended in adducted position to essentially delivering the femur. We also removed the capsule superiorly and the  piriformis from the piriformis  fossa to gain excellent exposure of the  proximal femur. Rongeur was used to remove some cancellous bone to get  into the lateral portion of the proximal femur for placement of the  initial starter reamer. The starter broaches was placed  the starter broach  and was shown to go down the center of the canal. Broaching  with the  Corail system was then performed starting at size 8, coursing  Up to size 9. A size 9 had excellent torsional and rotational  and axial stability. The trial standard offset neck was then placed  with a 28 + 1.5  trial head. The hip was then reduced. We confirmed that  the stem was in the canal both on AP and lateral x-rays. It also has excellent sizing. The hip was reduced with outstanding stability through full extension, full external rotation,  and then flexion in adduction internal rotation. AP pelvis was taken  and the leg lengths were measured and found to be exactly equal. Hip  was then dislocated again and the femoral head and neck removed. The  femoral broach was removed. Size 9 Corail stem with a standard offset  neck was then impacted into the femur following native anteversion. Has  excellent purchase in the canal. Excellent torsional and rotational and  axial stability. It is confirmed to be in the canal on AP and lateral  fluoroscopic views. The 28 + 1.5 ceramic head was placed and the hip  reduced with outstanding stability. Again AP pelvis was taken and it  confirmed that the leg lengths were equal. The wound was then copiously  irrigated with saline solution and the capsule reattached and repaired  with Ethibond suture.  20 mL of Exparel mixed with 50 mL of saline then additional 20 ml of .25% Bupivicaine injected into the capsule and into the edge of the tensor fascia lata as well as subcutaneous tissue. The fascia overlying the tensor fascia lata was  then closed with a running #1 V-Loc. Subcu was closed with interrupted  2-0 Vicryl and subcuticular running 4-0 Monocryl. Incision was cleaned  and dried. Steri-Strips and a bulky sterile dressing applied. Hemovac  drain was hooked to suction and then he was awakened and transported to  recovery in stable condition.        Please note that a surgical assistant was a medical necessity for this procedure to perform it in a safe and expeditious manner. Assistant was necessary to provide appropriate retraction of vital neurovascular structures and to prevent femoral fracture and allow for anatomic placement of the prosthesis.  Gaynelle Arabian,  M.D.

## 2013-08-13 ENCOUNTER — Encounter (HOSPITAL_COMMUNITY): Payer: Self-pay | Admitting: Orthopedic Surgery

## 2013-08-13 LAB — BASIC METABOLIC PANEL
BUN: 24 mg/dL — ABNORMAL HIGH (ref 6–23)
Creatinine, Ser: 1.21 mg/dL — ABNORMAL HIGH (ref 0.50–1.10)
GFR calc non Af Amer: 45 mL/min — ABNORMAL LOW (ref >90)
Glucose, Bld: 253 mg/dL — ABNORMAL HIGH (ref 70–99)
Potassium: 3.5 mEq/L (ref 3.5–5.1)

## 2013-08-13 LAB — CBC
HCT: 32.8 % — ABNORMAL LOW (ref 36.0–46.0)
Hemoglobin: 10.5 g/dL — ABNORMAL LOW (ref 12.0–15.0)
MCH: 25.2 pg — ABNORMAL LOW (ref 26.0–34.0)
MCHC: 32 g/dL (ref 30.0–36.0)
MCV: 78.7 fL (ref 78.0–100.0)
RDW: 14 % (ref 11.5–15.5)

## 2013-08-13 LAB — GLUCOSE, CAPILLARY
Glucose-Capillary: 189 mg/dL — ABNORMAL HIGH (ref 70–99)
Glucose-Capillary: 311 mg/dL — ABNORMAL HIGH (ref 70–99)

## 2013-08-13 NOTE — Progress Notes (Signed)
Physical Therapy Treatment Patient Details Name: Robin Brewer MRN: [id number] DOB: 1945-12-18 Today's Date: 08/13/2013 Time: 1340-1404 PT Time Calculation (min): 24 min  PT Assessment / Plan / Recommendation  History of Present Illness     PT Comments     Follow Up Recommendations  Home health PT     Does the patient have the potential to tolerate intense rehabilitation     Barriers to Discharge        Equipment Recommendations  Rolling walker with 5" wheels    Recommendations for Other Services OT consult  Frequency 7X/week   Progress towards PT Goals Progress towards PT goals: Progressing toward goals  Plan Current plan remains appropriate    Precautions / Restrictions Precautions Precautions: Fall Restrictions Weight Bearing Restrictions: No Other Position/Activity Restrictions: WBAT   Pertinent Vitals/Pain 4/10; premed, ice pack provided    Mobility  Bed Mobility Bed Mobility: Supine to Sit Supine to Sit: 1: +2 Total assist Supine to Sit: Patient Percentage: 50% Details for Bed Mobility Assistance: cues for sequence and use of L LE to self assist; physical assist for R LE management to bring trunk to upright and to move to EOB Transfers Transfers: Sit to Stand;Stand to Sit Sit to Stand: 1: +2 Total assist;From chair/3-in-1;With armrests;With upper extremity assist Sit to Stand: Patient Percentage: 60% Stand to Sit: 1: +2 Total assist;To chair/3-in-1;With armrests Stand to Sit: Patient Percentage: 60% Details for Transfer Assistance: cues and assist for LE management, cues for use of UEs to self assist Ambulation/Gait Ambulation/Gait Assistance: 1: +2 Total assist Ambulation/Gait: Patient Percentage: 70% Ambulation Distance (Feet): 28 Feet (and 5') Assistive device: Rolling walker Ambulation/Gait Assistance Details: cues for posture, sequence, position from RW Gait Pattern: Step-to pattern;Decreased step length - right;Decreased step length -  left;Shuffle;Trunk flexed    Exercises Total Joint Exercises Ankle Circles/Pumps: AROM;15 reps;Supine;Both Heel Slides: AAROM;10 reps;Supine;Right Hip ABduction/ADduction: AAROM;10 reps;Supine;Right   PT Diagnosis: Difficulty walking  PT Problem List: Decreased strength;Decreased range of motion;Decreased activity tolerance;Decreased balance;Decreased mobility;Decreased knowledge of use of DME;Obesity;Pain;Decreased knowledge of precautions PT Treatment Interventions: DME instruction;Gait training;Stair training;Functional mobility training;Therapeutic activities;Therapeutic exercise;Patient/family education   PT Goals (current goals can now be found in the care plan section) Acute Rehab PT Goals Patient Stated Goal: Resume previous lifestyle with decreased pain PT Goal Formulation: With patient Potential to Achieve Goals: Fair  Visit Information  Last PT Received On: 08/13/13 Assistance Needed: +2    Subjective Data  Subjective: I don't know if I can do this Patient Stated Goal: Resume previous lifestyle with decreased pain   Cognition  Cognition Arousal/Alertness: Awake/alert Behavior During Therapy: WFL for tasks assessed/performed Overall Cognitive Status: Within Functional Limits for tasks assessed    Balance     End of Session PT - End of Session Equipment Utilized During Treatment: Gait belt Activity Tolerance: Patient tolerated treatment well Patient left: Other (comment) (at sink with OT) Nurse Communication: Mobility status   GP     Teruo Stilley 08/13/2013, 2:26 PM

## 2013-08-13 NOTE — Progress Notes (Signed)
   Subjective: 1 Day Post-Op Procedure(s) (LRB): RIGHT TOTAL HIP ARTHROPLASTY ANTERIOR APPROACH (Right) Patient reports pain as moderate.   Patient seen in rounds for Dr. Wynelle Link. Patient is having problems with pain in the hip, requiring pain medications.  She can tell a difference though from her preop pain. We will start therapy today.  Plan is to go Home after hospital stay.  Objective: Vital signs in last 24 hours: Temp:  [97.3 F (36.3 C)-98.7 F (37.1 C)] 98.6 F (37 C) (08/28 0634) Pulse Rate:  [55-72] 59 (08/28 0634) Resp:  [11-16] 16 (08/28 0634) BP: (133-200)/(54-90) 140/63 mmHg (08/28 0634) SpO2:  [100 %] 100 % (08/28 0634) Weight:  [77.111 kg (170 lb)] 77.111 kg (170 lb) (08/27 1512)  Intake/Output from previous day:  Intake/Output Summary (Last 24 hours) at 08/13/13 0738 Last data filed at 08/13/13 0634  Gross per 24 hour  Intake 3338.75 ml  Output   2500 ml  Net 838.75 ml    Intake/Output this shift:    Labs:  Recent Labs  08/13/13 0503  HGB 10.5*    Recent Labs  08/13/13 0503  WBC 12.7*  RBC 4.17  HCT 32.8*  PLT 211    Recent Labs  08/13/13 0503  NA 136  K 3.5  CL 99  CO2 29  BUN 24*  CREATININE 1.21*  GLUCOSE 253*  CALCIUM 8.6   No results found for this basename: LABPT, INR,  in the last 72 hours  EXAM General - Patient is Alert, Appropriate and Oriented Extremity - Neurovascular intact Sensation intact distally Dorsiflexion/Plantar flexion intact Dressing - dressing C/D/I Motor Function - intact, moving foot and toes well on exam.  Hemovac pulled without difficulty.  Past Medical History  Diagnosis Date  . Hypertension   . Hypothyroidism   . Anxiety   . Depression   . Shortness of breath     with exertion   . Bronchitis     hx of   . Diabetes mellitus without complication   . Arthritis     Assessment/Plan: 1 Day Post-Op Procedure(s) (LRB): RIGHT TOTAL HIP ARTHROPLASTY ANTERIOR APPROACH (Right) Principal  Problem:   OA (osteoarthritis) of hip  Estimated body mass index is 31.09 kg/(m^2) as calculated from the following:   Height as of this encounter: $RemoveBeforeD'5\' 2"'GRNNqcNSvlblYk$  (1.575 m).   Weight as of this encounter: 77.111 kg (170 lb). Advance diet Up with therapy Discharge home with home health when ready  DVT Prophylaxis - Xarelto Weight Bearing As Tolerated right Leg Hemovac Pulled Begin Therapy No vaccines.  Mickel Crow 08/13/2013, 7:38 AM

## 2013-08-13 NOTE — Evaluation (Signed)
Occupational Therapy Evaluation Patient Details Name: Robin Brewer MRN: [id number] DOB: Jan 14, 1946 Today's Date: 08/13/2013 Time: 9326-7124 OT Time Calculation (min): 17 min  OT Assessment / Plan / Recommendation History of present illness pt admitted for R anterior direct THA.   Clinical Impression   Pt was admitted for the above.  All education was completed.  Pt does not need any further OT at this time.       OT Assessment  Patient does not need any further OT services    Follow Up Recommendations  No OT follow up    Barriers to Discharge      Equipment Recommendations  None recommended by OT    Recommendations for Other Services    Frequency       Precautions / Restrictions Precautions Precautions: Fall Restrictions Weight Bearing Restrictions: No Other Position/Activity Restrictions: WBAT   Pertinent Vitals/Pain Pain 2/10 R hip   ADL  Grooming: Min guard;Brushing hair;Wash/dry hands Where Assessed - Grooming: Supported standing Upper Body Bathing: Set up Where Assessed - Upper Body Bathing: Unsupported sitting Lower Body Bathing: Moderate assistance Where Assessed - Lower Body Bathing: Supported sit to stand Upper Body Dressing: Minimal assistance (iv) Where Assessed - Upper Body Dressing: Unsupported sitting Lower Body Dressing: Maximal assistance Where Assessed - Lower Body Dressing: Supported sit to stand Toilet Transfer: Simulated;Minimal assistance; moderate assist Toilet Transfer Method: Sit to stand (ambulated from bathroom sink to bed) Toileting - Clothing Manipulation and Hygiene: Minimal assistance Where Assessed - Toileting Clothing Manipulation and Hygiene: Sit to stand from 3-in-1 or toilet Equipment Used: Rolling walker Transfers/Ambulation Related to ADLs: ambulated with min A, cues for sequence and step length ADL Comments: husband was helping with adls, and pt does not have reacher.  He will continue to assist her.  Reviewed tub readiness.   Pt has shower seat but must be able to step over tub to access this.  They have a 3:1 commode    OT Diagnosis:    OT Problem List:   OT Treatment Interventions:     OT Goals(Current goals can be found in the care plan section) Acute Rehab OT Goals Patient Stated Goal: Resume previous lifestyle with decreased pain  Visit Information  Last OT Received On: 08/13/13 Assistance Needed: +1 History of Present Illness: pt admitted for R anterior direct THA.       [place] expects to be discharged to:: Private residence Living Arrangements: Spouse/significant other Available Help at Discharge: Family Type of Home: House Home Access: Stairs to enter Technical brewer of Steps: 2 Entrance Stairs-Rails: None Home Layout: One level [place]: Environmental consultant - 4 wheels Prior Function Level of Independence: Needs assistance (adls, LB) Communication Communication: No difficulties Dominant Hand: Right         Vision/Perception     Cognition  Cognition Arousal/Alertness: Awake/alert Behavior During Therapy: WFL for tasks assessed/performed Overall Cognitive Status: Within Functional Limits for tasks assessed    Extremity/Trunk Assessment      Mobility Bed Mobility Details for Bed Mobility Assistance: assist for legs; pt able to scoot over in bed with min A Transfers Sit to Stand: 1: +2 Total assist;From chair/3-in-1;With armrests;With upper extremity assist  Stand to Sit: 4: Min assist;To bed;With upper extremity assist  Details for Transfer Assistance: cues for UE/LE placement     Exercise    Balance     End of Session OT - End of Session Activity Tolerance: Patient tolerated treatment well  Patient left: in bed;with family/visitor present  [place] 08/13/2013, 2:28 PM Lesle Chris, OTR/L [phone] 08/13/2013

## 2013-08-13 NOTE — Evaluation (Signed)
Physical Therapy Evaluation Patient Details Name: Robin Brewer MRN: [id number] DOB: September 15, 1946 Today's Date: 08/13/2013 Time: 7416-3845 PT Time Calculation (min): 38 min  PT Assessment / Plan / Recommendation History of Present Illness     Clinical Impression  Pt s/p R THR presents with decreased R LE strength/ROM, post op pain and elevated anxiety level limiting functional mobility.  Pt should progress to d/c home with family assist and HHPT follow up.   PT Assessment  Patient needs continued PT services    Follow Up Recommendations  Home health PT    Does the patient have the potential to tolerate intense rehabilitation      Barriers to Discharge        Equipment Recommendations  Rolling walker with 5" wheels    Recommendations for Other Services OT consult   Frequency 7X/week    Precautions / Restrictions Precautions Precautions: Fall Restrictions Weight Bearing Restrictions: No Other Position/Activity Restrictions: WBAT   Pertinent Vitals/Pain 8/10; premed, ice pack provided      Mobility  Bed Mobility Bed Mobility: Supine to Sit Supine to Sit: 1: +2 Total assist Supine to Sit: Patient Percentage: 50% Details for Bed Mobility Assistance: cues for sequence and use of L LE to self assist; physical assist for R LE management to bring trunk to upright and to move to EOB Transfers Transfers: Sit to Stand;Stand to Sit Sit to Stand: 1: +2 Total assist;From bed;With upper extremity assist;From elevated surface Sit to Stand: Patient Percentage: 50% Stand to Sit: 1: +2 Total assist;To chair/3-in-1;With armrests Stand to Sit: Patient Percentage: 60% Details for Transfer Assistance: cues and assist for LE management, cues for use of UEs to self assist Ambulation/Gait Ambulation/Gait Assistance: 1: +2 Total assist Ambulation/Gait: Patient Percentage: 60% Ambulation Distance (Feet): 12 Feet Assistive device: Rolling walker Ambulation/Gait Assistance Details: cues for  sequence, posture and position from RW; physical assist for balance, support and to advance R LE Gait Pattern: Step-to pattern;Decreased step length - right;Decreased step length - left;Shuffle;Trunk flexed    Exercises Total Joint Exercises Ankle Circles/Pumps: AROM;15 reps;Supine;Both Heel Slides: AAROM;10 reps;Supine;Right Hip ABduction/ADduction: AAROM;10 reps;Supine;Right   PT Diagnosis: Difficulty walking  PT Problem List: Decreased strength;Decreased range of motion;Decreased activity tolerance;Decreased balance;Decreased mobility;Decreased knowledge of use of DME;Obesity;Pain;Decreased knowledge of precautions PT Treatment Interventions: DME instruction;Gait training;Stair training;Functional mobility training;Therapeutic activities;Therapeutic exercise;Patient/family education     PT Goals(Current goals can be found in the care plan section) Acute Rehab PT Goals Patient Stated Goal: Resume previous lifestyle with decreased pain PT Goal Formulation: With patient Potential to Achieve Goals: Fair  Visit Information  Last PT Received On: 08/13/13 Assistance Needed: +2       [place] expects to be discharged to:: Private residence Living Arrangements: Spouse/significant other Available Help at Discharge: Family Type of Home: House Home Access: Stairs to enter Technical brewer of Steps: 2 Entrance Stairs-Rails: None Home Layout: One level Home Equipment: Environmental consultant - 4 wheels Prior Function Level of Independence: Independent with assistive device(s) Communication Communication: No difficulties Dominant Hand: Right    Cognition  Cognition Arousal/Alertness: Awake/alert Behavior During Therapy: WFL for tasks assessed/performed Overall Cognitive Status: Within Functional Limits for tasks assessed    Extremity/Trunk Assessment Upper Extremity Assessment Upper Extremity Assessment: Overall WFL for tasks assessed Lower Extremity  Assessment Lower Extremity Assessment: RLE deficits/detail RLE Deficits / Details: 2/5 hip strength with AAROM at hip to 70 flex and 10 abd   Balance    End of Session PT -  End of Session Equipment Utilized During Treatment: Gait belt Activity Tolerance: Patient limited by pain;Other (comment) (anxiety level) Patient left: in chair;with call bell/phone within reach;with nursing/sitter in room Nurse Communication: Mobility status  GP     [place]en Lesure 08/13/2013, 11:53 AM

## 2013-08-13 NOTE — Care Management Note (Addendum)
    Page 1 of 2   08/15/2013     2:03:13 PM   CARE MANAGEMENT NOTE 08/15/2013  Patient:  Robin Brewer, Robin Brewer   Account Number:  [id number]  Date Initiated:  08/13/2013  Documentation initiated by:  Sherrin Daisy  Subjective/Objective Assessment:   dx osteoarthritis rt hip; total hip replacemnt-anterior approach     Action/Plan:   CM spoke with patient. Plans are for her to return to her home where spouse will be caregiver. She will need RW. Already has cane, commode chair, shower chair ad 4 wheeled walker. Arville Go will provide [facility] services.   Anticipated DC Date:  08/15/2013   Anticipated DC Plan:  [place]  CM consult      Choice offered to / List presented to:  C-1 Patient   DME arranged  Vassie Moselle      DME agency  [place] arranged  [place]   Status of service:  Completed, signed off Medicare Important Message given?   (If response is "NO", the following Medicare IM given date fields will be blank) Date Medicare IM given:   Date Additional Medicare IM given:    Discharge Disposition:  [place]  Per UR Regulation:  Reviewed for med. necessity/level of care/duration of stay  If discussed at [place] of Stay Meetings, dates discussed:    Comments:  08/15/13 Jude Linck RN,BSN NCM WEEKEND [phone] D/C HOME W/[facility] HHPT.[facility] REP DONNA GEORGE AWARE OF D/C,& HHPT ORDER.ALREADY RECEIVED RW BROUGHT TO RM PER [facility].  08/13/2013 Sherrin Daisy BSN RN CCM [phone] [facility] Go will provide [facility] services with start date of day after discharge. [place] DME rep notified that patient will need RW prior to discharge.

## 2013-08-14 DIAGNOSIS — D62 Acute posthemorrhagic anemia: Secondary | ICD-10-CM

## 2013-08-14 DIAGNOSIS — E876 Hypokalemia: Secondary | ICD-10-CM

## 2013-08-14 LAB — BASIC METABOLIC PANEL
BUN: 27 mg/dL — ABNORMAL HIGH (ref 6–23)
CO2: 34 mEq/L — ABNORMAL HIGH (ref 19–32)
Calcium: 9.5 mg/dL (ref 8.4–10.5)
Glucose, Bld: 122 mg/dL — ABNORMAL HIGH (ref 70–99)
Sodium: 140 mEq/L (ref 135–145)

## 2013-08-14 LAB — GLUCOSE, CAPILLARY
Glucose-Capillary: 78 mg/dL (ref 70–99)
Glucose-Capillary: 136 mg/dL — ABNORMAL HIGH (ref 70–99)

## 2013-08-14 LAB — CBC
HCT: 34.6 % — ABNORMAL LOW (ref 36.0–46.0)
Hemoglobin: 11.2 g/dL — ABNORMAL LOW (ref 12.0–15.0)
MCH: 25.8 pg — ABNORMAL LOW (ref 26.0–34.0)
MCHC: 32.4 g/dL (ref 30.0–36.0)
RBC: 4.34 MIL/uL (ref 3.87–5.11)

## 2013-08-14 MED ORDER — POTASSIUM CHLORIDE CRYS ER 20 MEQ PO TBCR
40.0000 meq | EXTENDED_RELEASE_TABLET | Freq: Two times a day (BID) | ORAL | Status: AC
  Administered 2013-08-14 (×2): 40 meq via ORAL
  Filled 2013-08-14 (×2): qty 2

## 2013-08-14 MED ORDER — RIVAROXABAN 10 MG PO TABS: 10.0000 mg | ORAL_TABLET | Freq: Every day | ORAL | Status: DC

## 2013-08-14 MED ORDER — METHOCARBAMOL 500 MG PO TABS: 500.0000 mg | ORAL_TABLET | Freq: Four times a day (QID) | ORAL | Status: DC | PRN

## 2013-08-14 MED ORDER — OXYCODONE HCL 5 MG PO TABS: 5.0000 mg | ORAL_TABLET | ORAL | Status: DC | PRN

## 2013-08-14 NOTE — Progress Notes (Signed)
Physical Therapy Treatment Patient Details Name: Robin Brewer MRN: [id number] DOB: 10/19/1946 Today's Date: 08/14/2013 Time: 4315-4008 PT Time Calculation (min): 27 min  PT Assessment / Plan / Recommendation  History of Present Illness pt admitted for R anterior direct THA.   PT Comments     Follow Up Recommendations  Home health PT     Does the patient have the potential to tolerate intense rehabilitation     Barriers to Discharge        Equipment Recommendations  Rolling walker with 5" wheels    Recommendations for Other Services OT consult  Frequency 7X/week   Progress towards PT Goals Progress towards PT goals: Progressing toward goals  Plan Current plan remains appropriate    Precautions / Restrictions Precautions Precautions: Fall Restrictions Weight Bearing Restrictions: No Other Position/Activity Restrictions: WBAT   Pertinent Vitals/Pain 7/10 with activity, premed, RN aware, ice packs provided    Mobility  Bed Mobility Bed Mobility: Supine to Sit Supine to Sit: 3: Mod assist Details for Bed Mobility Assistance: cues for sequence and use of L LE to self assist.  PHysical assist to manage R LE and to bring trunk to upright Transfers Transfers: Sit to Stand;Stand to Sit Sit to Stand: 3: Mod assist;With upper extremity assist;From bed Stand to Sit: 4: Min assist;3: Mod assist;With armrests;To chair/3-in-1 Details for Transfer Assistance: cues for UE/LE placement Ambulation/Gait Ambulation/Gait Assistance: 3: Mod assist;4: Min assist Ambulation Distance (Feet): 37 Feet Assistive device: Rolling walker Ambulation/Gait Assistance Details: cues for sequence, posture, position from RW and stride length Gait Pattern: Step-to pattern;Decreased step length - right;Decreased step length - left;Shuffle;Trunk flexed    Exercises Total Joint Exercises Ankle Circles/Pumps: AROM;15 reps;Supine;Both Quad Sets: AROM;10 reps;Supine;Both Heel Slides:  AAROM;Supine;Right;20 reps Hip ABduction/ADduction: AAROM;Supine;Right;20 reps   PT Diagnosis:    PT Problem List:   PT Treatment Interventions:     PT Goals (current goals can now be found in the care plan section) Acute Rehab PT Goals Patient Stated Goal: Resume previous lifestyle with decreased pain PT Goal Formulation: With patient Potential to Achieve Goals: Fair  Visit Information  Last PT Received On: 08/14/13 Assistance Needed: +1 History of Present Illness: pt admitted for R anterior direct THA.    Subjective Data  Patient Stated Goal: Resume previous lifestyle with decreased pain   Cognition  Cognition Arousal/Alertness: Awake/alert Behavior During Therapy: WFL for tasks assessed/performed Overall Cognitive Status: Within Functional Limits for tasks assessed    Balance     End of Session PT - End of Session Equipment Utilized During Treatment: Gait belt Activity Tolerance: Patient tolerated treatment well Patient left: in chair;with call bell/phone within reach Nurse Communication: Mobility status   GP     Robin Brewer 08/14/2013, 11:58 AM

## 2013-08-14 NOTE — Progress Notes (Signed)
   Subjective: 2 Days Post-Op Procedure(s) (LRB): RIGHT TOTAL HIP ARTHROPLASTY ANTERIOR APPROACH (Right) Patient reports pain as mild.   Patient seen in rounds with Dr. Wynelle Link. Patient is well, and has had no acute complaints or problems We will start therapy today.  Plan is to go Home after hospital stay.  If she does well, She may be ready to go home later today. If not, then home tomorrow.  Will see how she does today with therapy.  Will set up for possible discharge but will need to be notified if meets all goals and is ready.  Objective: Vital signs in last 24 hours: Temp:  [98.1 F (36.7 C)-99.8 F (37.7 C)] 98.1 F (36.7 C) (08/29 0426) Pulse Rate:  [54-64] 54 (08/29 0426) Resp:  [14-16] 16 (08/29 0426) BP: (135-192)/(63-79) 192/79 mmHg (08/29 0426) SpO2:  [94 %-99 %] 95 % (08/29 0426)  Intake/Output from previous day:  Intake/Output Summary (Last 24 hours) at 08/14/13 0806 Last data filed at 08/14/13 0426  Gross per 24 hour  Intake 758.83 ml  Output   2675 ml  Net -1916.17 ml    Labs:  Recent Labs  08/13/13 0503 08/14/13 0421  HGB 10.5* 11.2*    Recent Labs  08/13/13 0503 08/14/13 0421  WBC 12.7* 16.0*  RBC 4.17 4.34  HCT 32.8* 34.6*  PLT 211 254    Recent Labs  08/13/13 0503 08/14/13 0421  NA 136 140  K 3.5 3.3*  CL 99 101  CO2 29 34*  BUN 24* 27*  CREATININE 1.21* 1.49*  GLUCOSE 253* 122*  CALCIUM 8.6 9.5   No results found for this basename: LABPT, INR,  in the last 72 hours  EXAM General - Patient is Alert, Appropriate and Oriented Extremity - Neurovascular intact Sensation intact distally Dorsiflexion/Plantar flexion intact No cellulitis present Dressing - dressing C/D/I Motor Function - intact, moving foot and toes well on exam.   Past Medical History  Diagnosis Date  . Hypertension   . Hypothyroidism   . Anxiety   . Depression   . Shortness of breath     with exertion   . Bronchitis     hx of   . Diabetes mellitus  without complication   . Arthritis     Assessment/Plan: 2 Days Post-Op Procedure(s) (LRB): RIGHT TOTAL HIP ARTHROPLASTY ANTERIOR APPROACH (Right) Principal Problem:   OA (osteoarthritis) of hip Active Problems:   Postoperative anemia due to acute blood loss   Hypokalemia  Estimated body mass index is 31.09 kg/(m^2) as calculated from the following:   Height as of this encounter: $RemoveBeforeD'5\' 2"'lonXFucINOpMLn$  (1.575 m).   Weight as of this encounter: 77.111 kg (170 lb). Up with therapy Plan for discharge later today or tomorrow Discharge home with home health  DVT Prophylaxis - Xarelto Weight Bearing As Tolerated right Leg  Diet - Cardiac diet and Diabetic diet Follow up - in 2 weeks Activity - WBAT Disposition - Home Condition Upon Discharge - Pending D/C Meds - See DC Summary  Robin Brewer 08/14/2013, 8:06 AM

## 2013-08-14 NOTE — Progress Notes (Signed)
Physical Therapy Treatment Patient Details Name: Robin Brewer MRN: [id number] DOB: 11-01-1946 Today's Date: 08/14/2013 Time: 2376-2831 PT Time Calculation (min): 29 min  PT Assessment / Plan / Recommendation  History of Present Illness pt admitted for R anterior direct THA.   PT Comments     Follow Up Recommendations  Home health PT     Does the patient have the potential to tolerate intense rehabilitation     Barriers to Discharge        Equipment Recommendations  Rolling walker with 5" wheels    Recommendations for Other Services OT consult  Frequency 7X/week   Progress towards PT Goals Progress towards PT goals: Progressing toward goals  Plan Current plan remains appropriate    Precautions / Restrictions Precautions Precautions: Fall Restrictions Weight Bearing Restrictions: No Other Position/Activity Restrictions: WBAT   Pertinent Vitals/Pain     Mobility  Bed Mobility Bed Mobility: Sit to Supine Sit to Supine: 4: Min assist;3: Mod assist Details for Bed Mobility Assistance: cues for sequence and use of L LE to self assist.   Transfers Transfers: Sit to Stand;Stand to Sit Sit to Stand: 4: Min assist Stand to Sit: 4: Min assist Details for Transfer Assistance: cues for UE/LE placement Ambulation/Gait Ambulation/Gait Assistance: 4: Min assist Ambulation Distance (Feet): 125 Feet Assistive device: Rolling walker Ambulation/Gait Assistance Details: cues for posture, position from RW, stride length, to narrow BOS, to increase knee flex on R Gait Pattern: Step-to pattern;Decreased step length - right;Decreased step length - left;Shuffle;Trunk flexed    Exercises     PT Diagnosis:    PT Problem List:   PT Treatment Interventions:     PT Goals (current goals can now be found in the care plan section) Acute Rehab PT Goals Patient Stated Goal: Resume previous lifestyle with decreased pain PT Goal Formulation: With patient Potential to Achieve Goals:  Fair  Visit Information  Last PT Received On: 08/14/13 Assistance Needed: +1 History of Present Illness: pt admitted for R anterior direct THA.    Subjective Data  Patient Stated Goal: Resume previous lifestyle with decreased pain   Cognition  Cognition Arousal/Alertness: Awake/alert Behavior During Therapy: WFL for tasks assessed/performed Overall Cognitive Status: Within Functional Limits for tasks assessed    Balance     End of Session PT - End of Session Equipment Utilized During Treatment: Gait belt Activity Tolerance: Patient tolerated treatment well Patient left: in bed;with call bell/phone within reach;with family/visitor present Nurse Communication: Mobility status   GP     Leilan Bochenek 08/14/2013, 4:22 PM

## 2013-08-15 ENCOUNTER — Emergency Department (HOSPITAL_COMMUNITY): Payer: BC Managed Care – PPO

## 2013-08-15 ENCOUNTER — Inpatient Hospital Stay (HOSPITAL_COMMUNITY)
Admission: EM | Admit: 2013-08-15 | Discharge: 2013-08-19 | DRG: 533 | Disposition: A | Discharge status: Home Health | Payer: BC Managed Care – PPO | Attending: Internal Medicine | Admitting: Internal Medicine

## 2013-08-15 ENCOUNTER — Encounter (HOSPITAL_COMMUNITY): Payer: Self-pay

## 2013-08-15 DIAGNOSIS — D72829 Elevated white blood cell count, unspecified: Secondary | ICD-10-CM | POA: Diagnosis present

## 2013-08-15 DIAGNOSIS — E162 Hypoglycemia, unspecified: Secondary | ICD-10-CM | POA: Clinically undetermined

## 2013-08-15 DIAGNOSIS — G929 Unspecified toxic encephalopathy: Principal | ICD-10-CM | POA: Diagnosis present

## 2013-08-15 DIAGNOSIS — D62 Acute posthemorrhagic anemia: Secondary | ICD-10-CM

## 2013-08-15 DIAGNOSIS — Z79899 Other long term (current) drug therapy: Secondary | ICD-10-CM | POA: Diagnosis not present

## 2013-08-15 DIAGNOSIS — T40605A Adverse effect of unspecified narcotics, initial encounter: Secondary | ICD-10-CM | POA: Diagnosis present

## 2013-08-15 DIAGNOSIS — N179 Acute kidney failure, unspecified: Secondary | ICD-10-CM | POA: Diagnosis present

## 2013-08-15 DIAGNOSIS — D509 Iron deficiency anemia, unspecified: Secondary | ICD-10-CM | POA: Diagnosis present

## 2013-08-15 DIAGNOSIS — G894 Chronic pain syndrome: Secondary | ICD-10-CM | POA: Diagnosis present

## 2013-08-15 DIAGNOSIS — N289 Disorder of kidney and ureter, unspecified: Secondary | ICD-10-CM

## 2013-08-15 DIAGNOSIS — E039 Hypothyroidism, unspecified: Secondary | ICD-10-CM | POA: Diagnosis present

## 2013-08-15 DIAGNOSIS — I959 Hypotension, unspecified: Secondary | ICD-10-CM | POA: Diagnosis present

## 2013-08-15 DIAGNOSIS — E1169 Type 2 diabetes mellitus with other specified complication: Secondary | ICD-10-CM | POA: Diagnosis present

## 2013-08-15 DIAGNOSIS — E860 Dehydration: Secondary | ICD-10-CM | POA: Diagnosis present

## 2013-08-15 DIAGNOSIS — R5082 Postprocedural fever: Secondary | ICD-10-CM

## 2013-08-15 DIAGNOSIS — F329 Major depressive disorder, single episode, unspecified: Secondary | ICD-10-CM | POA: Diagnosis present

## 2013-08-15 DIAGNOSIS — M169 Osteoarthritis of hip, unspecified: Secondary | ICD-10-CM | POA: Diagnosis present

## 2013-08-15 DIAGNOSIS — I10 Essential (primary) hypertension: Secondary | ICD-10-CM | POA: Diagnosis present

## 2013-08-15 DIAGNOSIS — Z96649 Presence of unspecified artificial hip joint: Secondary | ICD-10-CM | POA: Diagnosis not present

## 2013-08-15 DIAGNOSIS — F3289 Other specified depressive episodes: Secondary | ICD-10-CM | POA: Diagnosis present

## 2013-08-15 DIAGNOSIS — G92 Toxic encephalopathy: Secondary | ICD-10-CM | POA: Diagnosis present

## 2013-08-15 DIAGNOSIS — E119 Type 2 diabetes mellitus without complications: Secondary | ICD-10-CM | POA: Diagnosis present

## 2013-08-15 DIAGNOSIS — F17200 Nicotine dependence, unspecified, uncomplicated: Secondary | ICD-10-CM | POA: Diagnosis present

## 2013-08-15 DIAGNOSIS — F32A Depression, unspecified: Secondary | ICD-10-CM | POA: Diagnosis present

## 2013-08-15 DIAGNOSIS — E876 Hypokalemia: Secondary | ICD-10-CM

## 2013-08-15 DIAGNOSIS — Z794 Long term (current) use of insulin: Secondary | ICD-10-CM

## 2013-08-15 DIAGNOSIS — F411 Generalized anxiety disorder: Secondary | ICD-10-CM | POA: Diagnosis present

## 2013-08-15 DIAGNOSIS — G9340 Encephalopathy, unspecified: Secondary | ICD-10-CM | POA: Diagnosis present

## 2013-08-15 DIAGNOSIS — R4182 Altered mental status, unspecified: Secondary | ICD-10-CM

## 2013-08-15 HISTORY — DX: Type 2 diabetes mellitus without complications: E11.9

## 2013-08-15 HISTORY — DX: Presence of unspecified artificial hip joint: Z96.649

## 2013-08-15 HISTORY — DX: Chronic pain syndrome: G89.4

## 2013-08-15 HISTORY — DX: Generalized anxiety disorder: F41.1

## 2013-08-15 HISTORY — DX: Hypothyroidism, unspecified: E03.9

## 2013-08-15 HISTORY — DX: Essential (primary) hypertension: I10

## 2013-08-15 LAB — CBC WITH DIFFERENTIAL/PLATELET
Eosinophils Absolute: 0.4 10*3/uL (ref 0.0–0.7)
Eosinophils Relative: 3 % (ref 0–5)
Hemoglobin: 10.7 g/dL — ABNORMAL LOW (ref 12.0–15.0)
Lymphs Abs: 2.5 10*3/uL (ref 0.7–4.0)
MCH: 26.1 pg (ref 26.0–34.0)
MCV: 81.5 fL (ref 78.0–100.0)
Monocytes Relative: 10 % (ref 3–12)
RBC: 4.1 MIL/uL (ref 3.87–5.11)

## 2013-08-15 LAB — COMPREHENSIVE METABOLIC PANEL
ALT: 6 U/L (ref 0–35)
AST: 16 U/L (ref 0–37)
Albumin: 2.7 g/dL — ABNORMAL LOW (ref 3.5–5.2)
Calcium: 8.9 mg/dL (ref 8.4–10.5)
Chloride: 99 mEq/L (ref 96–112)
Creatinine, Ser: 2.46 mg/dL — ABNORMAL HIGH (ref 0.50–1.10)
Sodium: 134 mEq/L — ABNORMAL LOW (ref 135–145)
Total Bilirubin: 0.1 mg/dL — ABNORMAL LOW (ref 0.3–1.2)

## 2013-08-15 LAB — URINE MICROSCOPIC-ADD ON

## 2013-08-15 LAB — CBC
MCH: 26.1 pg (ref 26.0–34.0)
Platelets: 218 10*3/uL (ref 150–400)
RBC: 4.22 MIL/uL (ref 3.87–5.11)
WBC: 12.5 10*3/uL — ABNORMAL HIGH (ref 4.0–10.5)

## 2013-08-15 LAB — URINALYSIS, ROUTINE W REFLEX MICROSCOPIC
Glucose, UA: NEGATIVE mg/dL
Hgb urine dipstick: NEGATIVE
Ketones, ur: NEGATIVE mg/dL
Protein, ur: 100 mg/dL — AB

## 2013-08-15 LAB — GLUCOSE, CAPILLARY: Glucose-Capillary: 114 mg/dL — ABNORMAL HIGH (ref 70–99)

## 2013-08-15 MED ORDER — SODIUM CHLORIDE 0.9 % IV SOLN
INTRAVENOUS | Status: DC
  Administered 2013-08-15 – 2013-08-16 (×2): via INTRAVENOUS

## 2013-08-15 MED ORDER — ACETAMINOPHEN 325 MG PO TABS
650.0000 mg | ORAL_TABLET | Freq: Once | ORAL | Status: AC
  Administered 2013-08-15: 650 mg via ORAL
  Filled 2013-08-15: qty 2

## 2013-08-15 MED ORDER — SODIUM CHLORIDE 0.9 % IV BOLUS (SEPSIS)
500.0000 mL | Freq: Once | INTRAVENOUS | Status: AC
  Administered 2013-08-15: 500 mL via INTRAVENOUS

## 2013-08-15 NOTE — Progress Notes (Signed)
Pt stable, scripts, equipment, and d/c instructions given with no questions/concerns voiced by pt or husband.  Pt transported via wheelchair to private vehicle by NT and husband.

## 2013-08-15 NOTE — H&P (Signed)
Robin Brewer    Chief Complaint:s/p R THA 8/27 with fever and altered mental status HPI: The patient is a 67 y.o. female s/p R THA by Dr. Wynelle Link 8/27, discharged home this am, family noted changes in mentation and returned to ER with finding of temp to 102.6. Past hx chronic pain on Opana prior to surgery but reportedly none since. Leucocytosis since surgery but trending down. C/o low back pain and hip pain at time of interview.  Past Medical History  Diagnosis Date  . Hypertension   . Hypothyroidism   . Anxiety   . Depression   . Shortness of breath     with exertion   . Bronchitis     hx of   . Diabetes mellitus without complication   . Arthritis     Past Surgical History  Procedure Laterality Date  . Abdominal hysterectomy    . Back surgery    . Neck surgery    . Total hip arthroplasty Right 08/12/2013    Procedure: RIGHT TOTAL HIP ARTHROPLASTY ANTERIOR APPROACH;  Surgeon: [name], MD;  Location: [facility];  Service: Orthopedics;  Laterality: Right;    Family History  Problem Relation Age of Onset  . Transient ischemic attack Mother     Social History:  reports that she has been smoking Cigarettes.  She has been smoking about 0.25 packs per day. She has never used smokeless tobacco. She reports that she does not drink alcohol or use illicit drugs.  Allergies:  Allergies  Allergen Reactions  . Aspirin     Upset stomach   . Niacin And Related Hives     (Not in a hospital admission)   Physical Exam: somnolent, mildly confused, cooperative, no SOB, abdomen distended but minimally tender, right hip incision without drainage, mild erythema along edges, steristrips in place, no fluctuance, moderate diffuse swelling, compartments soft, N/V intact distally RLE    recent CXR clear, labs show mild renal impairment   Vitals  Temp:  [98.9 F (37.2 C)-102.6 F (39.2 C)] 98.9 F (37.2 C) (08/30 2308) Pulse Rate:  [65] 65 (08/30 1954) Resp:  [13-16] 13 (08/30  1954) SpO2:  [82 %-98 %] 94 % (08/30 1959)  Assessment/Plan  Impression: fevers, altered mentation, and renal dysfunction s/p R THA. Question possible medication reaction/build-up in light of mild renal impairment. Discussed with Dr. Posey Pronto from [facility] who has kindly agreed to evaluate Robin Brewer.  Plan of Action:  Admit, antipyretics, work up per Dr. Posey Pronto.  Robin Brewer M 08/15/2013, 11:36 PM

## 2013-08-15 NOTE — Progress Notes (Signed)
   Subjective: 3 Days Post-Op Procedure(s) (LRB): RIGHT TOTAL HIP ARTHROPLASTY ANTERIOR APPROACH (Right)   Patient reports pain as mild, pain well controlled. No events throughout the night. Discussed with PT, pt progressing well. Ready to be discharged home.  Objective:   VITALS:   Filed Vitals:   08/15/13  BP: 118/66  Pulse: 56  Temp: 97.7 F (36.5 C)   Resp: 16    Neurovascular intact Dorsiflexion/Plantar flexion intact Incision: dressing C/D/I No cellulitis present Compartment soft  LABS  Recent Labs  08/13/13 0503 08/14/13 0421 08/15/13 0445  HGB 10.5* 11.2* 11.0*  HCT 32.8* 34.6* 34.1*  WBC 12.7* 16.0* 12.5*  PLT 211 254 218     Recent Labs  08/13/13 0503 08/14/13 0421  NA 136 140  K 3.5 3.3*  BUN 24* 27*  CREATININE 1.21* 1.49*  GLUCOSE 253* 122*     Assessment/Plan: 3 Days Post-Op Procedure(s) (LRB): RIGHT TOTAL HIP ARTHROPLASTY ANTERIOR APPROACH (Right) Up with therapy Discharge home with home health Follow up in 2 weeks at [facility] Follow up with Dr. Wynelle Link in 2 weeks.  Contact information:  [facility] [address] [place]   PAC  08/15/2013, 8:27 AM

## 2013-08-15 NOTE — ED Notes (Signed)
Pt was just released today after being admitted after right hip surgery. Per EMS, family reports that patient was saying things that did not make sense today and was not acting like herself. Per EMS, stroke scale negative. Per EMS, CBG 75, BP 148/88, HR 68, R 18, 98% 2L O2. Pt has a 20 g placed in her left hand via EMS. Per EMS, pt does feel febrile and her temp for EMS was 101.5.

## 2013-08-15 NOTE — ED Provider Notes (Signed)
CSN: [id number]     Arrival date & time 08/15/13  1944 History   First MD Initiated Contact with Patient 08/15/13 1959     Chief Complaint  Patient presents with  . Altered Mental Status   (Consider location/radiation/quality/duration/timing/severity/associated sxs/prior Treatment) HPI Pt was just released today after being admitted after right hip surgery. Per EMS, family reports that patient was saying things that did not make sense today and was not acting like herself. Per EMS, stroke scale negative. Per EMS, CBG 75, BP 148/88, HR 68, R 18, 98% 2L O2. Pt has a 20 g placed in her left hand via EMS. Per EMS, pt does feel febrile and her temp for EMS was 101.5.  Patient denies any recent cough or urgency frequency.  Patient does have pain in her right hip it is not worse than it was when she left the hospital.  There's no drainage from the incision scar.  Patient did not have chills or rigors.  Patient has past history of smoking.  Has not smoked since her surgery.  Past Medical History  Diagnosis Date  . Hypertension   . Hypothyroidism   . Anxiety   . Depression   . Shortness of breath     with exertion   . Bronchitis     hx of   . Diabetes mellitus without complication   . Arthritis    Past Surgical History  Procedure Laterality Date  . Abdominal hysterectomy    . Back surgery    . Neck surgery    . Total hip arthroplasty Right 08/12/2013    Procedure: RIGHT TOTAL HIP ARTHROPLASTY ANTERIOR APPROACH;  Surgeon: [name], MD;  Location: [facility];  Service: Orthopedics;  Laterality: Right;   Family History  Problem Relation Age of Onset  . Transient ischemic attack Mother    History  Substance Use Topics  . Smoking status: Current Every Day Smoker -- 0.25 packs/day    Types: Cigarettes  . Smokeless tobacco: Never Used  . Alcohol Use: No   OB History   Grav Para Term Preterm Abortions TAB SAB Ect Mult Living                 Review of Systems All other systems  reviewed and are negative Allergies  Aspirin and Niacin and related  Home Medications   Current Outpatient Rx  Name  Route  Sig  Dispense  Refill  . albuterol (PROAIR HFA) 108 (90 BASE) MCG/ACT inhaler   Inhalation   Inhale 2 puffs into the lungs every 6 (six) hours as needed for wheezing.         Marland Kitchen ALPRAZolam (XANAX) 0.25 MG tablet   Oral   Take 0.25 mg by mouth 3 (three) times daily. As needed         . amLODipine-valsartan (EXFORGE) 10-320 MG per tablet   Oral   Take 1 tablet by mouth every evening.         Marland Kitchen atenolol-chlorthalidone (TENORETIC) 50-25 MG per tablet   Oral   Take 1 tablet by mouth every morning.         . fenofibrate 160 MG tablet   Oral   Take 160 mg by mouth every morning.         . gabapentin (NEURONTIN) 600 MG tablet   Oral   Take 900-1,200 mg by mouth 3 (three) times daily. TAKES 1 AND 1/2 TABLETS  2 TIMES DAILY AND 2 TABLETS AT BEDTIME         .  glimepiride (AMARYL) 4 MG tablet   Oral   Take 4 mg by mouth 2 (two) times daily.         . insulin aspart (NOVOLOG) 100 UNIT/ML injection   Subcutaneous   Inject 12 Units into the skin 3 (three) times daily with meals.          . insulin detemir (LEVEMIR) 100 UNIT/ML injection   Subcutaneous   Inject 50 Units into the skin daily. Patient takes in the am         . levothyroxine (SYNTHROID, LEVOTHROID) 50 MCG tablet   Oral   Take 50 mcg by mouth daily before breakfast.         . lisinopril (PRINIVIL,ZESTRIL) 40 MG tablet   Oral   Take 40 mg by mouth every evening.         . lubiprostone (AMITIZA) 24 MCG capsule   Oral   Take 24 mcg by mouth 2 (two) times daily with a meal. As needed         . oxyCODONE (OXY IR/ROXICODONE) 5 MG immediate release tablet   Oral   Take 1-2 tablets (5-10 mg total) by mouth every 3 (three) hours as needed.   80 tablet   0   . oxymorphone (OPANA ER) 15 MG 12 hr tablet   Oral   Take 15 mg by mouth every 8 (eight) hours.         .  rivaroxaban (XARELTO) 10 MG TABS tablet   Oral   Take 1 tablet (10 mg total) by mouth daily with breakfast. Take Xarelto for two and a half more weeks, then discontinue Xarelto. Once the patient has completed the blood thinner regimen, then take a Baby 81 mg Aspirin daily for four more weeks.   19 tablet   0   . rosuvastatin (CRESTOR) 20 MG tablet   Oral   Take 20 mg by mouth every evening.         . sertraline (ZOLOFT) 100 MG tablet   Oral   Take 100 mg by mouth every evening.         Marland Kitchen tiZANidine (ZANAFLEX) 4 MG tablet   Oral   Take 4-12 mg by mouth at bedtime. 1-3 TABLETS 1 HOUR PRIOR TO BEDTIME         . methocarbamol (ROBAXIN) 500 MG tablet   Oral   Take 1 tablet (500 mg total) by mouth every 6 (six) hours as needed.   80 tablet   0    Pulse 65  Temp(Src) 102.6 F (39.2 C) (Oral)  Resp 13  SpO2 94% Physical Exam  Nursing note and vitals reviewed. Constitutional: She is oriented to person, place, and time. She appears well-developed and well-nourished. No distress.  HENT:  Head: Normocephalic and atraumatic.  Eyes: Pupils are equal, round, and reactive to light.  Neck: Normal range of motion and full passive range of motion without pain. Neck supple.  Cardiovascular: Normal rate and intact distal pulses.   Pulmonary/Chest: Breath sounds normal. No respiratory distress.  Abdominal: Normal appearance. She exhibits no distension. There is no tenderness. There is no rebound.  Musculoskeletal: Normal range of motion.       Legs: Neurological: She is alert and oriented to person, place, and time. No cranial nerve deficit.  Skin: Skin is warm and dry. No rash noted.  Psychiatric: She has a normal mood and affect. Her behavior is normal.    ED Course  Procedures (including critical care time)  Medications  sodium chloride 0.9 % bolus 500 mL (500 mLs Intravenous New Bag/Given 08/15/13 2241)  0.9 %  sodium chloride infusion (not administered)  acetaminophen  (TYLENOL) tablet 650 mg (650 mg Oral Given 08/15/13 2032)      Labs Review Labs Reviewed  COMPREHENSIVE METABOLIC PANEL - Abnormal; Notable for the following:    Sodium 134 (*)    BUN 47 (*)    Creatinine, Ser 2.46 (*)    Albumin 2.7 (*)    Total Bilirubin 0.1 (*)    GFR calc non Af Amer 19 (*)    GFR calc Af Amer 22 (*)    All other components within normal limits  CBC WITH DIFFERENTIAL - Abnormal; Notable for the following:    WBC 13.0 (*)    Hemoglobin 10.7 (*)    HCT 33.4 (*)    Neutro Abs 8.8 (*)    Monocytes Absolute 1.3 (*)    All other components within normal limits  URINALYSIS, ROUTINE W REFLEX MICROSCOPIC - Abnormal; Notable for the following:    APPearance CLOUDY (*)    Bilirubin Urine SMALL (*)    Protein, ur 100 (*)    All other components within normal limits  URINE MICROSCOPIC-ADD ON - Abnormal; Notable for the following:    Bacteria, UA FEW (*)    Casts HYALINE CASTS (*)    All other components within normal limits  URINE CULTURE   Imaging Review Dg Chest 2 View  08/15/2013   *RADIOLOGY REPORT*  Clinical Data: Fever after hip surgery.  CHEST - 2 VIEW  Comparison: None.  Findings: Low volume lungs, especially in the lateral projection. No focal infiltrate, edema, pleural effusion, or pneumothorax.  Normal heart size and mediastinal contours.  Extensive cervical and upper thoracic ACDF with ventral plate screw fixation.  IMPRESSION: Negative for pneumonia or other acute abnormality.   Original Report Authenticated By: Jorje Guild    MDM   1. Postoperative fever   2. Dehydration   3. Renal insufficiency        Dot Lanes, MD 08/15/13 [phone]

## 2013-08-15 NOTE — Progress Notes (Signed)
Physical Therapy Treatment Patient Details Name: Robin Brewer MRN: [id number] DOB: 08/17/1946 Today's Date: 08/15/2013 Time: 0811-0900 PT Time Calculation (min): 49 min  PT Assessment / Plan / Recommendation  History of Present Illness pt admitted for R anterior direct THA.   PT Comments     Follow Up Recommendations  Home health PT     Does the patient have the potential to tolerate intense rehabilitation     Barriers to Discharge        Equipment Recommendations  Rolling walker with 5" wheels    Recommendations for Other Services OT consult  Frequency 7X/week   Progress towards PT Goals Progress towards PT goals: Progressing toward goals  Plan Current plan remains appropriate    Precautions / Restrictions Precautions Precautions: Fall Restrictions Weight Bearing Restrictions: No Other Position/Activity Restrictions: WBAT   Pertinent Vitals/Pain 4/10; premed, ice pack provided    Mobility  Bed Mobility Bed Mobility: Supine to Sit Supine to Sit: 4: Min assist Details for Bed Mobility Assistance: cues for sequence and use of L LE to self assist.   Transfers Transfers: Sit to Stand;Stand to Sit Sit to Stand: 4: Min guard Stand to Sit: 4: Min guard Details for Transfer Assistance: cues for UE/LE placement Ambulation/Gait Ambulation/Gait Assistance: 4: Min guard Ambulation Distance (Feet): 85 Feet Assistive device: Rolling walker Ambulation/Gait Assistance Details: cues for posture, position from RW, stride length, increased DF, narrowed BOS and sequence Gait Pattern: Step-to pattern;Decreased step length - right;Decreased step length - left;Shuffle;Trunk flexed Stairs: Yes Stairs Assistance: 4: Min assist Stairs Assistance Details (indicate cue type and reason): cues for sequence, and foot/RW placement Stair Management Technique: No rails;Step to pattern;Backwards;With walker Number of Stairs: 2    Exercises Total Joint Exercises Ankle Circles/Pumps: AROM;15  reps;Supine;Both Quad Sets: AROM;Supine;Both;15 reps Heel Slides: AAROM;Supine;Right;20 reps Hip ABduction/ADduction: AAROM;Supine;Right;20 reps   PT Diagnosis:    PT Problem List:   PT Treatment Interventions:     PT Goals (current goals can now be found in the care plan section) Acute Rehab PT Goals Patient Stated Goal: Resume previous lifestyle with decreased pain PT Goal Formulation: With patient Potential to Achieve Goals: Fair  Visit Information  Last PT Received On: 08/15/13 Assistance Needed: +1 History of Present Illness: pt admitted for R anterior direct THA.    Subjective Data  Patient Stated Goal: Resume previous lifestyle with decreased pain   Cognition  Cognition Arousal/Alertness: Awake/alert Behavior During Therapy: WFL for tasks assessed/performed Overall Cognitive Status: Within Functional Limits for tasks assessed    Balance     End of Session PT - End of Session Equipment Utilized During Treatment: Gait belt Activity Tolerance: Patient tolerated treatment well Patient left: with call bell/phone within reach;in chair Nurse Communication: Mobility status   GP     Robin Brewer 08/15/2013, 12:34 PM

## 2013-08-15 NOTE — ED Notes (Signed)
Bed: WA07 Expected date:  Expected time:  Means of arrival:  Comments: EMS 67yo F, Dc'd today, recent hip surgey

## 2013-08-15 NOTE — ED Notes (Signed)
Patient transported to X-ray 

## 2013-08-15 NOTE — Progress Notes (Signed)
Physical Therapy Treatment Patient Details Name: IKEYA BROCKEL MRN: [id number] DOB: 10/09/1946 Today's Date: 08/15/2013 Time: 1211-1225 PT Time Calculation (min): 14 min  PT Assessment / Plan / Recommendation  History of Present Illness pt admitted for R anterior direct THA.   PT Comments   Pt husband and son present for family education.  Husband states understanding of pts need for min assist with LE management with bed mobility and to stay with pt initially when up and around 2* occasional mild instability.  Reviewed car transfers with pt and family as well as demonstrating stair climbing.  Follow Up Recommendations  Home health PT     Does the patient have the potential to tolerate intense rehabilitation     Barriers to Discharge        Equipment Recommendations  Rolling walker with 5" wheels    Recommendations for Other Services OT consult  Frequency 7X/week   Progress towards PT Goals Progress towards PT goals: Progressing toward goals  Plan Current plan remains appropriate    Precautions / Restrictions Precautions Precautions: Fall Restrictions Weight Bearing Restrictions: No Other Position/Activity Restrictions: WBAT   Pertinent Vitals/Pain Min c/o pain at this time.    Mobility  Bed Mobility Bed Mobility: Supine to Sit Supine to Sit: 4: Min assist Details for Bed Mobility Assistance: cues for sequence and use of L LE to self assist.   Transfers Transfers: Sit to Stand;Stand to Sit Sit to Stand: 4: Min guard Stand to Sit: 4: Min guard Details for Transfer Assistance: cues for UE/LE placement Ambulation/Gait Ambulation/Gait Assistance: 4: Min guard Ambulation Distance (Feet): 85 Feet Assistive device: Rolling walker Ambulation/Gait Assistance Details: cues for posture, position from RW, stride length, increased DF, narrowed BOS and sequence Gait Pattern: Step-to pattern;Decreased step length - right;Decreased step length - left;Shuffle;Trunk flexed Stairs:  Yes Stairs Assistance: 4: Min assist Stairs Assistance Details (indicate cue type and reason): cues for sequence, and foot/RW placement Stair Management Technique: No rails;Step to pattern;Backwards;With walker Number of Stairs: 2    Exercises Total Joint Exercises Ankle Circles/Pumps: AROM;15 reps;Supine;Both Quad Sets: AROM;Supine;Both;15 reps Heel Slides: AAROM;Supine;Right;20 reps Hip ABduction/ADduction: AAROM;Supine;Right;20 reps   PT Diagnosis:    PT Problem List:   PT Treatment Interventions:     PT Goals (current goals can now be found in the care plan section) Acute Rehab PT Goals Patient Stated Goal: Resume previous lifestyle with decreased pain PT Goal Formulation: With patient Potential to Achieve Goals: Fair  Visit Information  Last PT Received On: 08/15/13 Assistance Needed: +1 History of Present Illness: pt admitted for R anterior direct THA.    Subjective Data  Patient Stated Goal: Resume previous lifestyle with decreased pain   Cognition  Cognition Arousal/Alertness: Awake/alert Behavior During Therapy: WFL for tasks assessed/performed Overall Cognitive Status: Within Functional Limits for tasks assessed    Balance     End of Session PT - End of Session Equipment Utilized During Treatment: Gait belt Activity Tolerance: Patient tolerated treatment well Patient left: with call bell/phone within reach;in chair Nurse Communication: Mobility status   GP     Adreena Willits 08/15/2013, 12:38 PM

## 2013-08-16 ENCOUNTER — Encounter (HOSPITAL_COMMUNITY): Payer: Self-pay | Admitting: *Deleted

## 2013-08-16 ENCOUNTER — Inpatient Hospital Stay (HOSPITAL_COMMUNITY): Payer: BC Managed Care – PPO

## 2013-08-16 ENCOUNTER — Observation Stay (HOSPITAL_COMMUNITY): Payer: BC Managed Care – PPO

## 2013-08-16 LAB — CBC WITH DIFFERENTIAL/PLATELET
Basophils Absolute: 0 10*3/uL (ref 0.0–0.1)
Eosinophils Relative: 3 % (ref 0–5)
HCT: 33.3 % — ABNORMAL LOW (ref 36.0–46.0)
Lymphocytes Relative: 19 % (ref 12–46)
Lymphs Abs: 2.1 10*3/uL (ref 0.7–4.0)
MCV: 82.6 fL (ref 78.0–100.0)
Monocytes Absolute: 1 10*3/uL (ref 0.1–1.0)
Neutro Abs: 7.9 10*3/uL — ABNORMAL HIGH (ref 1.7–7.7)
RBC: 4.03 MIL/uL (ref 3.87–5.11)
RDW: 15.4 % (ref 11.5–15.5)
WBC: 11.4 10*3/uL — ABNORMAL HIGH (ref 4.0–10.5)

## 2013-08-16 LAB — URINE MICROSCOPIC-ADD ON

## 2013-08-16 LAB — CREATININE, URINE, RANDOM: Creatinine, Urine: 125 mg/dL

## 2013-08-16 LAB — GLUCOSE, CAPILLARY
Glucose-Capillary: 37 mg/dL — CL (ref 70–99)
Glucose-Capillary: 254 mg/dL — ABNORMAL HIGH (ref 70–99)

## 2013-08-16 LAB — COMPREHENSIVE METABOLIC PANEL
ALT: 9 U/L (ref 0–35)
AST: 24 U/L (ref 0–37)
Albumin: 2.4 g/dL — ABNORMAL LOW (ref 3.5–5.2)
Calcium: 8.2 mg/dL — ABNORMAL LOW (ref 8.4–10.5)
Sodium: 139 mEq/L (ref 135–145)
Total Protein: 5.7 g/dL — ABNORMAL LOW (ref 6.0–8.3)

## 2013-08-16 LAB — URINALYSIS, ROUTINE W REFLEX MICROSCOPIC
Glucose, UA: NEGATIVE mg/dL
Leukocytes, UA: NEGATIVE
Nitrite: NEGATIVE
pH: 5 (ref 5.0–8.0)

## 2013-08-16 LAB — HEMOGLOBIN A1C: Mean Plasma Glucose: 203 mg/dL — ABNORMAL HIGH (ref <117)

## 2013-08-16 MED ORDER — LEVOTHYROXINE SODIUM 50 MCG PO TABS
50.0000 ug | ORAL_TABLET | Freq: Every day | ORAL | Status: DC
  Administered 2013-08-17 – 2013-08-19 (×3): 50 ug via ORAL
  Filled 2013-08-16 (×4): qty 1

## 2013-08-16 MED ORDER — INSULIN ASPART 100 UNIT/ML ~~LOC~~ SOLN
0.0000 [IU] | Freq: Three times a day (TID) | SUBCUTANEOUS | Status: DC
  Administered 2013-08-16: 8 [IU] via SUBCUTANEOUS
  Administered 2013-08-17 (×3): 3 [IU] via SUBCUTANEOUS
  Administered 2013-08-18 (×2): 2 [IU] via SUBCUTANEOUS
  Administered 2013-08-18: 3 [IU] via SUBCUTANEOUS

## 2013-08-16 MED ORDER — SODIUM CHLORIDE 0.9 % IV SOLN: INTRAVENOUS | Status: DC

## 2013-08-16 MED ORDER — POLYETHYLENE GLYCOL 3350 17 G PO PACK
17.0000 g | PACK | Freq: Every day | ORAL | Status: DC | PRN
  Administered 2013-08-17: 17 g via ORAL
  Filled 2013-08-16: qty 1

## 2013-08-16 MED ORDER — LUBIPROSTONE 24 MCG PO CAPS
24.0000 ug | ORAL_CAPSULE | Freq: Two times a day (BID) | ORAL | Status: DC
  Administered 2013-08-16 – 2013-08-19 (×6): 24 ug via ORAL
  Filled 2013-08-16 (×8): qty 1

## 2013-08-16 MED ORDER — SERTRALINE HCL 100 MG PO TABS
100.0000 mg | ORAL_TABLET | Freq: Every evening | ORAL | Status: DC
  Administered 2013-08-16 – 2013-08-18 (×3): 100 mg via ORAL
  Filled 2013-08-16 (×4): qty 1

## 2013-08-16 MED ORDER — BISACODYL 5 MG PO TBEC
5.0000 mg | DELAYED_RELEASE_TABLET | Freq: Every day | ORAL | Status: DC | PRN
  Filled 2013-08-16: qty 1

## 2013-08-16 MED ORDER — SODIUM CHLORIDE 0.9 % IV BOLUS (SEPSIS)
1000.0000 mL | Freq: Once | INTRAVENOUS | Status: AC
  Administered 2013-08-16: 1000 mL via INTRAVENOUS

## 2013-08-16 MED ORDER — SODIUM CHLORIDE 0.9 % IV SOLN
INTRAVENOUS | Status: DC
  Administered 2013-08-16: 23:00:00 via INTRAVENOUS

## 2013-08-16 MED ORDER — BIOTENE DRY MOUTH MT LIQD
15.0000 mL | Freq: Two times a day (BID) | OROMUCOSAL | Status: DC
  Administered 2013-08-16 – 2013-08-19 (×7): 15 mL via OROMUCOSAL

## 2013-08-16 MED ORDER — DEXTROSE 50 % IV SOLN
50.0000 mL | Freq: Once | INTRAVENOUS | Status: AC | PRN
  Administered 2013-08-16: 50 mL via INTRAVENOUS

## 2013-08-16 MED ORDER — LEVOTHYROXINE SODIUM 50 MCG PO TABS
50.0000 ug | ORAL_TABLET | Freq: Once | ORAL | Status: AC
  Administered 2013-08-16: 50 ug via ORAL
  Filled 2013-08-16: qty 1

## 2013-08-16 MED ORDER — KETOROLAC TROMETHAMINE 30 MG/ML IJ SOLN
15.0000 mg | Freq: Four times a day (QID) | INTRAMUSCULAR | Status: DC | PRN
  Administered 2013-08-17 – 2013-08-18 (×5): 15 mg via INTRAVENOUS
  Filled 2013-08-16 (×5): qty 1

## 2013-08-16 MED ORDER — MORPHINE SULFATE 2 MG/ML IJ SOLN
0.5000 mg | INTRAMUSCULAR | Status: DC | PRN
  Administered 2013-08-16: 0.5 mg via INTRAVENOUS
  Filled 2013-08-16: qty 1

## 2013-08-16 MED ORDER — HEPARIN SODIUM (PORCINE) 5000 UNIT/ML IJ SOLN
5000.0000 [IU] | Freq: Three times a day (TID) | INTRAMUSCULAR | Status: DC
  Administered 2013-08-16 – 2013-08-18 (×7): 5000 [IU] via SUBCUTANEOUS
  Filled 2013-08-16 (×10): qty 1

## 2013-08-16 MED ORDER — METHOCARBAMOL 500 MG PO TABS
500.0000 mg | ORAL_TABLET | Freq: Four times a day (QID) | ORAL | Status: DC | PRN
  Administered 2013-08-16 – 2013-08-18 (×3): 500 mg via ORAL
  Filled 2013-08-16 (×3): qty 1

## 2013-08-16 MED ORDER — ACETAMINOPHEN 325 MG PO TABS: 650.0000 mg | ORAL_TABLET | Freq: Four times a day (QID) | ORAL | Status: DC | PRN

## 2013-08-16 MED ORDER — HYDROCODONE-ACETAMINOPHEN 5-325 MG PO TABS: 1.0000 | ORAL_TABLET | Freq: Four times a day (QID) | ORAL | Status: DC | PRN

## 2013-08-16 MED ORDER — INSULIN DETEMIR 100 UNIT/ML ~~LOC~~ SOLN
25.0000 [IU] | Freq: Every day | SUBCUTANEOUS | Status: DC
  Administered 2013-08-16 – 2013-08-18 (×3): 25 [IU] via SUBCUTANEOUS
  Filled 2013-08-16 (×5): qty 0.25

## 2013-08-16 MED ORDER — DEXTROSE 50 % IV SOLN
INTRAVENOUS | Status: AC
  Administered 2013-08-16: 50 mL via INTRAVENOUS
  Filled 2013-08-16: qty 50

## 2013-08-16 MED ORDER — LIDOCAINE 5 % EX PTCH
1.0000 | MEDICATED_PATCH | CUTANEOUS | Status: DC
  Administered 2013-08-17 – 2013-08-19 (×3): 1 via TRANSDERMAL
  Filled 2013-08-16 (×6): qty 1

## 2013-08-16 MED ORDER — ALBUTEROL SULFATE HFA 108 (90 BASE) MCG/ACT IN AERS: 2.0000 | INHALATION_SPRAY | Freq: Four times a day (QID) | RESPIRATORY_TRACT | Status: DC | PRN

## 2013-08-16 MED ORDER — MAGNESIUM CITRATE PO SOLN: 1.0000 | Freq: Once | ORAL | Status: AC | PRN

## 2013-08-16 MED ORDER — DEXTROSE-NACL 5-0.9 % IV SOLN
INTRAVENOUS | Status: DC
  Administered 2013-08-16: 14:00:00 via INTRAVENOUS
  Filled 2013-08-16 (×4): qty 1000

## 2013-08-16 MED ORDER — DOCUSATE SODIUM 100 MG PO CAPS
100.0000 mg | ORAL_CAPSULE | Freq: Two times a day (BID) | ORAL | Status: DC
  Administered 2013-08-16 – 2013-08-19 (×6): 100 mg via ORAL
  Filled 2013-08-16 (×9): qty 1

## 2013-08-16 NOTE — Progress Notes (Signed)
I have seen and assessed patient and agree with Dr Serita Grit assessment and plan. Patient more alert but with some bouts of confusion. UA neg, CXR neg. CT head neg.  FENA = 0.49. Renal US neg for hydronephrosis. IVF. Hold nephrotoxins. Supportive care. Advance diet. Follow.

## 2013-08-16 NOTE — Progress Notes (Signed)
Dr. Susie Cassette office returned phone call at [phone] regarding hypoglycemic event. Asked to page Dr. Grandville Silos. Will continue to monitor.

## 2013-08-16 NOTE — ED Notes (Signed)
Santiago Glad RN on 5N at [facility] called for report.

## 2013-08-16 NOTE — ED Notes (Signed)
[facility] called for transport. 

## 2013-08-16 NOTE — Consult Note (Signed)
[facility] Initial Consult Note  Robin Brewer  AVW:[id number]  DOB: July 13, 1946  DOA: 08/15/2013 DoS: 08/16/2013   Referring physician: Dr Onnie Graham PCP: Nicoletta Dress, MD   Reason for consult: AMS and fever  HPI: Robin Brewer is a 67 y.o. female with Past medical history of hypertension, hypothyroidism, diabetes mellitus. Patient was recently admitted for right hip surgery and was discharged today. As per the family the patient started showing signs of confusion while she was in the hospital and on the way back home in the car. She has been having hallucination. After reaching home she started feeling drowsy and sleepy. She has not received any medication after reaching home. There is no diarrhea or cough. The family reports the patient has no aspiration at home.  At present the patient is drowsy but arousable and appropriately answers questions. She complains of pain on the right hip. Other than that she does not have any acute complaints.  There is no trauma or injury reported at home.  Review of Systems: as mentioned in the history of present illness.  A Comprehensive review of the other systems is negative.  Past Medical History  Diagnosis Date  . Hypertension   . Hypothyroidism   . Anxiety   . Depression   . Shortness of breath     with exertion   . Bronchitis     hx of   . Diabetes mellitus without complication   . Arthritis    Past Surgical History  Procedure Laterality Date  . Abdominal hysterectomy    . Back surgery    . Neck surgery    . Total hip arthroplasty Right 08/12/2013    Procedure: RIGHT TOTAL HIP ARTHROPLASTY ANTERIOR APPROACH;  Surgeon: [name], MD;  Location: [facility];  Service: Orthopedics;  Laterality: Right;   Social History:  reports that she has been smoking Cigarettes.  She has been smoking about 0.25 packs per day. She has never used smokeless tobacco. She reports that she does not drink alcohol or use illicit drugs. Patient is  coming from home.  Allergies  Allergen Reactions  . Aspirin     Upset stomach   . Niacin And Related Hives    Family History  Problem Relation Age of Onset  . Transient ischemic attack Mother     Prior to Admission medications   Medication Sig Start Date End Date Taking? Authorizing Provider  albuterol (PROAIR HFA) 108 (90 BASE) MCG/ACT inhaler Inhale 2 puffs into the lungs every 6 (six) hours as needed for wheezing.   Yes Historical Provider, MD  ALPRAZolam (XANAX) 0.25 MG tablet Take 0.25 mg by mouth 3 (three) times daily. As needed   Yes Historical Provider, MD  amLODipine-valsartan (EXFORGE) 10-320 MG per tablet Take 1 tablet by mouth every evening.   Yes Historical Provider, MD  atenolol-chlorthalidone (TENORETIC) 50-25 MG per tablet Take 1 tablet by mouth every morning.   Yes Historical Provider, MD  fenofibrate 160 MG tablet Take 160 mg by mouth every morning.   Yes Historical Provider, MD  gabapentin (NEURONTIN) 600 MG tablet Take 900-1,200 mg by mouth 3 (three) times daily. TAKES 1 AND 1/2 TABLETS  2 TIMES DAILY AND 2 TABLETS AT BEDTIME   Yes Historical Provider, MD  glimepiride (AMARYL) 4 MG tablet Take 4 mg by mouth 2 (two) times daily.   Yes Historical Provider, MD  insulin aspart (NOVOLOG) 100 UNIT/ML injection Inject 12 Units into the skin 3 (three) times daily with meals.  Yes Historical Provider, MD  insulin detemir (LEVEMIR) 100 UNIT/ML injection Inject 50 Units into the skin daily. Patient takes in the am   Yes Historical Provider, MD  levothyroxine (SYNTHROID, LEVOTHROID) 50 MCG tablet Take 50 mcg by mouth daily before breakfast.   Yes Historical Provider, MD  lisinopril (PRINIVIL,ZESTRIL) 40 MG tablet Take 40 mg by mouth every evening.   Yes Historical Provider, MD  lubiprostone (AMITIZA) 24 MCG capsule Take 24 mcg by mouth 2 (two) times daily with a meal. As needed   Yes Historical Provider, MD  oxyCODONE (OXY IR/ROXICODONE) 5 MG immediate release tablet Take 1-2  tablets (5-10 mg total) by mouth every 3 (three) hours as needed. 08/14/13  Yes Alexzandrew Dara Lords, PA-C  oxymorphone (OPANA ER) 15 MG 12 hr tablet Take 15 mg by mouth every 8 (eight) hours.   Yes Historical Provider, MD  rivaroxaban (XARELTO) 10 MG TABS tablet Take 1 tablet (10 mg total) by mouth daily with breakfast. Take Xarelto for two and a half more weeks, then discontinue Xarelto. Once the patient has completed the blood thinner regimen, then take a Baby 81 mg Aspirin daily for four more weeks. 08/14/13  Yes Alexzandrew Dara Lords, PA-C  rosuvastatin (CRESTOR) 20 MG tablet Take 20 mg by mouth every evening.   Yes Historical Provider, MD  sertraline (ZOLOFT) 100 MG tablet Take 100 mg by mouth every evening.   Yes Historical Provider, MD  tiZANidine (ZANAFLEX) 4 MG tablet Take 4-12 mg by mouth at bedtime. 1-3 TABLETS 1 HOUR PRIOR TO BEDTIME   Yes Historical Provider, MD  methocarbamol (ROBAXIN) 500 MG tablet Take 1 tablet (500 mg total) by mouth every 6 (six) hours as needed. 08/14/13   Alexzandrew Dara Lords, PA-C    Physical Exam: Filed Vitals:   08/15/13 1954 08/15/13 1959 08/15/13 2308 08/16/13 0045  BP:    112/60  Pulse: 65   63  Temp: 102.6 F (39.2 C)  98.9 F (37.2 C)   TempSrc: Oral  Oral   Resp: 13   13  SpO2: 82% 94%  98%    General: Alert, drowsy but arousable and Oriented to Time, Place and Person. Appear in mild distress Eyes: PERRL ENT: Oral Mucosa dry. Neck: No JVD, no Carotid Bruits  Cardiovascular: S1 and S2 Present, aortic systolic Murmur, Peripheral Pulses Present Respiratory: Bilateral Air entry equal and Decreased, shallow breathing, Clear to Auscultation,  No Crackles, no wheezes Abdomen: Bowel Sound Present, Soft and Non tender Skin: No Rash Extremities: No Pedal edema, no calf tenderness Neurologic: Mental status: Poor recall, Decreased attention, mild motor dysarthria, difficulty comprehensive complex commands , Cranial Nerves grossly within normal limits,  Motor strength bilaterally equal 4/5, Sensation intact to light touch, reflexes +2 biceps, babinski withdrawal, Proprioception intact, Cerebellar test finger-nose-finger normal bilaterally.   Labs:  Basic Metabolic Panel:  Recent Labs Lab 08/13/13 0503 08/14/13 0421 08/15/13 2030  NA 136 140 134*  K 3.5 3.3* 4.1  CL 99 101 99  CO2 29 34* 26  GLUCOSE 253* 122* 75  BUN 24* 27* 47*  CREATININE 1.21* 1.49* 2.46*  CALCIUM 8.6 9.5 8.9   Liver Function Tests:  Recent Labs Lab 08/15/13 2030  AST 16  ALT 6  ALKPHOS 55  BILITOT 0.1*  PROT 6.3  ALBUMIN 2.7*   No results found for this basename: LIPASE, AMYLASE,  in the last 168 hours No results found for this basename: AMMONIA,  in the last 168 hours CBC:  Recent Labs Lab 08/13/13 0503 08/14/13  0421 08/15/13 0445 08/15/13 2030  WBC 12.7* 16.0* 12.5* 13.0*  NEUTROABS  --   --   --  8.8*  HGB 10.5* 11.2* 11.0* 10.7*  HCT 32.8* 34.6* 34.1* 33.4*  MCV 78.7 79.7 80.8 81.5  PLT 211 254 218 238   Cardiac Enzymes: No results found for this basename: CKTOTAL, CKMB, CKMBINDEX, TROPONINI,  in the last 168 hours  BNP (last 3 results) No results found for this basename: PROBNP,  in the last 8760 hours CBG:  Recent Labs Lab 08/13/13 2050 08/14/13 0724 08/14/13 1141 08/14/13 1640 08/15/13 0725  GLUCAP 311* 78 229* 136* 114*    Radiological Exams: Dg Chest 2 View  08/15/2013   *RADIOLOGY REPORT*  Clinical Data: Fever after hip surgery.  CHEST - 2 VIEW  Comparison: None.  Findings: Low volume lungs, especially in the lateral projection. No focal infiltrate, edema, pleural effusion, or pneumothorax.  Normal heart size and mediastinal contours.  Extensive cervical and upper thoracic ACDF with ventral plate screw fixation.  IMPRESSION: Negative for pneumonia or other acute abnormality.   Original Report Authenticated By: Jorje Guild    Assessment/Plan 1. altered mental status Patient does not have any focal neurological  deficit other than memory and attention and high motor function.  She does not have any meningitic signs. There is no trauma or injury recorded. As per the family isn't has not aspirated, has not received any medication after reaching home. Urinalysis and chest x-ray appears within normal limits. There is worsening of renal function. As per the family she was mildly confused even in the hospital on the day of discharge. Most likely diagnosis is post operative delirium. Complicated by worsening renal function leading to poor clearance of narcotics. She does not appear to have any signs of active infection at present, although she had leukocytosis and fever which could be also postoperative. Considering her worsening of renal function, with a GFR less than 30, rivaroxaban is also contraindicated at present.  We will obtain a CT scan of the head to rule out any intracranial bleed. Neuro checks every 4 hours. Monitor on telemetry. Oxygen as needed. Avoid narcotic medications until patient is completely awake. Repeat blood cultures if the fever recurs.  2. Acute kidney injury Likely the result of postoperative blood loss versus hypotension. Gentle hydration. Repeat BMP in the morning. It continues to worsen would require further workup. Avoid nephrotoxic medications.  3. Postoperative anemia Iron supplements once the patient is awake. Monitor CBC daily.  Family Communication: family was at bedside and all questions were answered. Thank you very much for allowing me to participate in your patient's care.    Author: Berle Mull, MD [facility] Pager: [phone] 08/16/2013 1:04 AM    If 7PM-7AM, please contact night-coverage [email or web address] Password [email or web address]

## 2013-08-16 NOTE — Plan of Care (Signed)
Problem: Consults Goal: General Medical Patient Education See Patient Education Module for specific education. Outcome: Not Met (add Reason) Pt too lethargic & no family present on the admission at 03:00am on 08/16/13

## 2013-08-16 NOTE — Progress Notes (Signed)
    Subjective:     Patient reports pain as 5 on 0-10 scale.   Denies CP or SOB.  Positive flatus. Objective: Vital signs in last 24 hours: Temp:  [98.1 F (36.7 C)-102.6 F (39.2 C)] 98.2 F (36.8 C) (08/31 0528) Pulse Rate:  [60-120] 119 (08/31 0528) Resp:  [10-16] 14 (08/31 0528) BP: (108-124)/(43-60) 122/53 mmHg (08/31 0528) SpO2:  [82 %-98 %] 98 % (08/31 0528) Weight:  [79.6 kg (175 lb 7.8 oz)] 79.6 kg (175 lb 7.8 oz) (08/31 0312)  Intake/Output from previous day: 08/30 0701 - 08/31 0700 In: 850 [I.V.:850] Out: -  Intake/Output this shift:    Labs:  Recent Labs  08/14/13 0421 08/15/13 0445 08/15/13 2030 08/16/13 0725  HGB 11.2* 11.0* 10.7* 10.3*    Recent Labs  08/15/13 2030 08/16/13 0725  WBC 13.0* 11.4*  RBC 4.10 4.03  HCT 33.4* 33.3*  PLT 238 234    Recent Labs  08/15/13 2030 08/16/13 0725  NA 134* 139  K 4.1 4.2  CL 99 103  CO2 26 27  BUN 47* 49*  CREATININE 2.46* 2.31*  GLUCOSE 75 61*  CALCIUM 8.9 8.2*    Recent Labs  08/16/13 0725  INR 1.15    Physical Exam: Neurologically intact ABD soft Neurovascular intact Incision: dressing C/D/I and no drainage Compartment soft  Assessment/Plan:     Advance diet Up with therapy Altered MS persists.  No evidence of wound infection/complication Defer MS work-up to medical team. Foley placed due to difficulty voiding.    Melina Schools D for Dr. Melina Schools [facility] [phone] 08/16/2013, 8:53 AM

## 2013-08-16 NOTE — ED Notes (Signed)
Dr. Onnie Graham called to inform this RN that patient does not need to be transported to [facility] and she needs to stay at [facility]. [facility] cancelled, charge RN made aware, and floor nurse at [facility] is informed that she will not be receiving patient.

## 2013-08-16 NOTE — Progress Notes (Signed)
Hypoglycemic Event  CBG: 37   Treatment: D50 IV 50 mL  Symptoms: None  Follow-up CBG: Time:1337 CBG Result:174  Possible Reasons for Event: Inadequate meal intake  Comments/MD notified:1330    Robin Brewer  Remember to initiate Hypoglycemia Order Set & complete

## 2013-08-16 NOTE — Progress Notes (Signed)
Spoke with Dr. Grandville Silos, he will change some orders; wants [place] to continue to monitor pt's blood sugar.

## 2013-08-17 ENCOUNTER — Encounter (HOSPITAL_COMMUNITY): Payer: Self-pay | Admitting: Internal Medicine

## 2013-08-17 DIAGNOSIS — E039 Hypothyroidism, unspecified: Secondary | ICD-10-CM | POA: Diagnosis present

## 2013-08-17 DIAGNOSIS — G9340 Encephalopathy, unspecified: Secondary | ICD-10-CM | POA: Diagnosis present

## 2013-08-17 DIAGNOSIS — F411 Generalized anxiety disorder: Secondary | ICD-10-CM | POA: Diagnosis present

## 2013-08-17 DIAGNOSIS — E119 Type 2 diabetes mellitus without complications: Secondary | ICD-10-CM

## 2013-08-17 DIAGNOSIS — I10 Essential (primary) hypertension: Secondary | ICD-10-CM

## 2013-08-17 DIAGNOSIS — D62 Acute posthemorrhagic anemia: Secondary | ICD-10-CM

## 2013-08-17 DIAGNOSIS — N179 Acute kidney failure, unspecified: Secondary | ICD-10-CM

## 2013-08-17 DIAGNOSIS — E860 Dehydration: Secondary | ICD-10-CM | POA: Diagnosis present

## 2013-08-17 DIAGNOSIS — F329 Major depressive disorder, single episode, unspecified: Secondary | ICD-10-CM | POA: Diagnosis present

## 2013-08-17 DIAGNOSIS — Z96649 Presence of unspecified artificial hip joint: Secondary | ICD-10-CM

## 2013-08-17 DIAGNOSIS — E162 Hypoglycemia, unspecified: Secondary | ICD-10-CM | POA: Clinically undetermined

## 2013-08-17 DIAGNOSIS — F32A Depression, unspecified: Secondary | ICD-10-CM | POA: Diagnosis present

## 2013-08-17 DIAGNOSIS — G894 Chronic pain syndrome: Secondary | ICD-10-CM

## 2013-08-17 HISTORY — DX: Chronic pain syndrome: G89.4

## 2013-08-17 HISTORY — DX: Encephalopathy, unspecified: G93.40

## 2013-08-17 HISTORY — DX: Presence of unspecified artificial hip joint: Z96.649

## 2013-08-17 HISTORY — DX: Essential (primary) hypertension: I10

## 2013-08-17 HISTORY — DX: Type 2 diabetes mellitus without complications: E11.9

## 2013-08-17 HISTORY — DX: Acute kidney failure, unspecified: N17.9

## 2013-08-17 HISTORY — DX: Generalized anxiety disorder: F41.1

## 2013-08-17 HISTORY — DX: Hypothyroidism, unspecified: E03.9

## 2013-08-17 LAB — URINE CULTURE: Culture: NO GROWTH

## 2013-08-17 LAB — CBC WITH DIFFERENTIAL/PLATELET
Eosinophils Relative: 4 % (ref 0–5)
HCT: 31.8 % — ABNORMAL LOW (ref 36.0–46.0)
Hemoglobin: 9.8 g/dL — ABNORMAL LOW (ref 12.0–15.0)
Lymphocytes Relative: 13 % (ref 12–46)
Lymphs Abs: 1 10*3/uL (ref 0.7–4.0)
MCV: 81.7 fL (ref 78.0–100.0)
Monocytes Absolute: 0.6 10*3/uL (ref 0.1–1.0)
Monocytes Relative: 7 % (ref 3–12)
Neutro Abs: 6 10*3/uL (ref 1.7–7.7)
RBC: 3.89 MIL/uL (ref 3.87–5.11)
WBC: 7.9 10*3/uL (ref 4.0–10.5)

## 2013-08-17 LAB — BASIC METABOLIC PANEL
CO2: 29 mEq/L (ref 19–32)
Chloride: 107 mEq/L (ref 96–112)
Creatinine, Ser: 1.28 mg/dL — ABNORMAL HIGH (ref 0.50–1.10)
Potassium: 4 mEq/L (ref 3.5–5.1)
Sodium: 141 mEq/L (ref 135–145)

## 2013-08-17 LAB — GLUCOSE, CAPILLARY
Glucose-Capillary: 174 mg/dL — ABNORMAL HIGH (ref 70–99)
Glucose-Capillary: 190 mg/dL — ABNORMAL HIGH (ref 70–99)
Glucose-Capillary: 166 mg/dL — ABNORMAL HIGH (ref 70–99)

## 2013-08-17 MED ORDER — MAGNESIUM SULFATE 40 MG/ML IJ SOLN
2.0000 g | Freq: Once | INTRAMUSCULAR | Status: AC
  Administered 2013-08-17: 2 g via INTRAVENOUS
  Filled 2013-08-17: qty 50

## 2013-08-17 MED ORDER — ONDANSETRON HCL 4 MG/2ML IJ SOLN
4.0000 mg | Freq: Four times a day (QID) | INTRAMUSCULAR | Status: DC | PRN
  Administered 2013-08-17 – 2013-08-18 (×3): 4 mg via INTRAVENOUS
  Filled 2013-08-17 (×3): qty 2

## 2013-08-17 MED ORDER — SODIUM CHLORIDE 0.9 % IV SOLN
INTRAVENOUS | Status: DC
  Administered 2013-08-17 – 2013-08-18 (×4): via INTRAVENOUS

## 2013-08-17 MED ORDER — ALPRAZOLAM 0.25 MG PO TABS: 0.2500 mg | ORAL_TABLET | Freq: Three times a day (TID) | ORAL | Status: DC

## 2013-08-17 MED ORDER — HYDRALAZINE HCL 20 MG/ML IJ SOLN
10.0000 mg | Freq: Once | INTRAMUSCULAR | Status: AC
  Administered 2013-08-17: 10 mg via INTRAVENOUS
  Filled 2013-08-17: qty 1

## 2013-08-17 MED ORDER — ALPRAZOLAM 0.25 MG PO TABS
0.2500 mg | ORAL_TABLET | Freq: Three times a day (TID) | ORAL | Status: DC | PRN
  Administered 2013-08-17: 0.25 mg via ORAL
  Filled 2013-08-17: qty 1

## 2013-08-17 NOTE — Progress Notes (Signed)
Inpatient Diabetes Program Recommendations  AACE/ADA: New Consensus Statement on Inpatient Glycemic Control (2013)  Target Ranges:  Prepandial:   less than 140 mg/dL      Peak postprandial:   less than 180 mg/dL (1-2 hours)      Critically ill patients:  140 - 180 mg/dL   Reason for Visit: Hyperglycemia with 1 hypoglycemic event  Results for TEMARA, LANUM (MRN [id number]) as of 08/17/2013 10:39  Ref. Range 08/15/2013 20:30 08/16/2013 07:25 08/17/2013 04:32  Glucose Latest Range: 70-99 mg/dL 75 61 (L) 187 (H)  Results for CATHE, BILGER (MRN [id number]) as of 08/17/2013 10:39  Ref. Range 08/16/2013 13:37 08/16/2013 15:36 08/16/2013 17:11 08/16/2013 20:40 08/17/2013 07:07  Glucose-Capillary Latest Range: 70-99 mg/dL 174 (H) 230 (H) 277 (H) 254 (H) 174 (H)    Inpatient Diabetes Program Recommendations Insulin - Basal: If FBS >180 mg/dL, gradually titrate up Levemir (home dose 50 units QD) Insulin - Meal Coverage: Add Novolog 4 units tidwc for meal coverage insulin if pt eats >50% meal.  Note: Will continue to follow while inpatient.  Thank you. Lorenda Peck, RD, LDN, CDE Inpatient Diabetes Coordinator [phone]

## 2013-08-17 NOTE — Progress Notes (Signed)
   Subjective: Confusion. Patient reports pain as moderate.  Denies CP or SOB.  C/o left had pain where she a IV site looks to have infiltrated. She is confused where or not she has had any PT recently or if ever.    Objective:   VITALS:   Filed Vitals:   08/17/13  BP: 136/60  Pulse: 68  Temp: 99.1 F (37.3 C)   Resp: 16    Neurovascular intact Dorsiflexion/Plantar flexion intact Incision: dressing C/D/I No cellulitis present Compartment soft  LABS  Recent Labs  08/15/13 2030 08/16/13 0725 08/17/13 0432  HGB 10.7* 10.3* 9.8*  HCT 33.4* 33.3* 31.8*  WBC 13.0* 11.4* 7.9  PLT 238 234 241     Recent Labs  08/15/13 2030 08/16/13 0725 08/17/13 0432  NA 134* 139 141  K 4.1 4.2 4.0  BUN 47* 49* 33*  CREATININE 2.46* 2.31* 1.28*  GLUCOSE 75 61* 187*     Assessment/Plan:   Up with therapy, as tolerated Altered MS persists. No evidence of wound infection/complication  Defer MS work-up to medical team.   [place] P[place]. [facility] [place]   PAC  08/17/2013, 9:37 AM

## 2013-08-17 NOTE — Evaluation (Signed)
Physical Therapy Evaluation Patient Details Name: Robin Brewer MRN: [id number] DOB: Apr 18, 1946 Today's Date: 08/17/2013 Time: 2542-7062 PT Time Calculation (min): 23 min  PT Assessment / Plan / Recommendation History of Present Illness  Pt had recent R direct anterior THA, went home then was readmitted same day with fever and AMS.   Clinical Impression  *Mod assist supine to sit, min A sit to stand. Pt ambulated 79' with RW and min A. Mild confusion. HHPT recommended. Pt would benefit from acute PT to maximize safety and independence with mobility. **    PT Assessment  Patient needs continued PT services    Follow Up Recommendations  Home health PT    Does the patient have the potential to tolerate intense rehabilitation      Barriers to Discharge        Equipment Recommendations       Recommendations for Other Services OT consult   Frequency 7X/week    Precautions / Restrictions Precautions Precautions: Fall Restrictions Weight Bearing Restrictions: No Other Position/Activity Restrictions: WBAT   Pertinent Vitals/Pain **2/10 R hip with activity Rest SaO2 98% on RA after activity *      Mobility  Bed Mobility Bed Mobility: Supine to Sit Supine to Sit: 3: Mod assist Supine to Sit: Patient Percentage: 60% Details for Bed Mobility Assistance: cues for sequencing and technique, mod A for raising trunk Transfers Transfers: Sit to Stand;Stand to Sit Sit to Stand: 4: Min assist;From chair/3-in-1;From bed;With armrests;With upper extremity assist Sit to Stand: Patient Percentage: 80% Stand to Sit: 4: Min assist;To chair/3-in-1;With upper extremity assist;With armrests Stand to Sit: Patient Percentage: 80% Details for Transfer Assistance: cues for UE/LE placement Ambulation/Gait Ambulation/Gait Assistance: 4: Min guard Ambulation Distance (Feet): 55 Feet Assistive device: Rolling walker Gait Pattern: Step-to pattern;Decreased step length - right;Decreased step  length - left;Trunk flexed Gait velocity: decreased General Gait Details: min/guard for safety, VCs to correct flexed neck Stairs: No    Exercises Total Joint Exercises Ankle Circles/Pumps: AROM;15 reps;Supine;Both Heel Slides: AAROM;Supine;Right;5 reps Hip ABduction/ADduction: AAROM;Supine;Right;5 reps Long Arc Quad: AAROM;Right;10 reps   PT Diagnosis: Difficulty walking;Altered mental status  PT Problem List: Decreased strength;Decreased range of motion;Decreased activity tolerance;Decreased balance;Decreased mobility;Decreased knowledge of use of DME;Obesity;Pain;Decreased knowledge of precautions PT Treatment Interventions: Gait training;Functional mobility training;Therapeutic activities;Therapeutic exercise;Patient/family education     PT Goals(Current goals can be found in the care plan section) Acute Rehab PT Goals Patient Stated Goal: Resume previous lifestyle with decreased pain PT Goal Formulation: With patient Potential to Achieve Goals: Fair  Visit Information  Last PT Received On: 08/17/13 Assistance Needed: +1 PT/OT Co-Evaluation/Treatment: Yes History of Present Illness: Pt had recent R direct anterior THA, went home then was readmitted same day with fever and AMS.        Prior [place] expects to be discharged to:: Private residence Living Arrangements: Spouse/significant other Available Help at Discharge: Family Type of Home: House Home Access: Stairs to enter Technical brewer of Steps: 2 Entrance Stairs-Rails: None Home Layout: One level [place]: Environmental consultant - 4 wheels Prior Function Level of Independence: Needs assistance Communication Communication: No difficulties Dominant Hand: Right    Cognition  Cognition Arousal/Alertness: Awake/alert Behavior During Therapy: WFL for tasks assessed/performed Overall Cognitive Status: Within Functional Limits for tasks assessed Memory: Decreased short-term memory     Extremity/Trunk Assessment Upper Extremity Assessment Upper Extremity Assessment: Overall WFL for tasks assessed Lower Extremity Assessment Lower Extremity Assessment: RLE deficits/detail RLE Deficits / Details: 2/5 hip  strength with AAROM at hip to 70 flex and 10 abd   Balance    End of Session PT - End of Session Equipment Utilized During Treatment: Gait belt Activity Tolerance: Patient tolerated treatment well Patient left: with call bell/phone within reach;in chair;with chair alarm set Nurse Communication: Mobility status  GP     Blondell Reveal Kistler 08/17/2013, 11:53 AM [phone]

## 2013-08-17 NOTE — Progress Notes (Signed)
[facility] PROGRESS NOTE/CONSULT  KEYSHLA TUNISON TFT:[id number] DOB: 09-03-1946 DOA: 08/15/2013 PCP: Nicoletta Dress, MD  Assessment/Plan: #1 acute encephalopathy Likely secondary to narcotic pain medication buildup in the setting of acute renal failure. Urinalysis is negative. Chest x-ray is negative. Patient currently afebrile. CT of the head is negative. Patient with no focal neurological deficits. Clinical improvement. Follow. Patient currently on a Lidoderm patch.   #2 acute renal failure Likely secondary to prerenal azotemia in the setting of hypotension and ACE inhibitor, ARB, diuretics. Renal ultrasound negative for hydronephrosis. Improving with hydration. Continue to avoid nephrotoxic medications. Decrease IV fluids to 75 cc per hour. Follow.  #3 postop anemia H&H stable. Follow.  #4 hypertension Blood pressure stable off antihypertensive medications. Continue to hold BP medications. Good blood pressure control needed we'll start with Norvasc and up titrate.  #5 diabetes mellitus Hemoglobin A1c is 8.7. CBGs have ranged from 174 - 277. Change to D5 normal saline to normal saline. Patient currently on half home dose of Levaquin. Continue sliding scale insulin. Follow CBGs and up titrate as needed.  #6 hypothyroidism Continue Synthroid.  #7 status post right hip arthroplasty 08/12/2013 Per primary team. Currently a Lidoderm patch. Toradol IV when necessary breakthrough pain. Patient also on Vicodin for breakthrough pain. On discharge for pain management will recommend continuing the Lidoderm patch and either resuming patient's home regimen of Opana 15 mg 3 times a day when necessary for breakthrough pain which patient has been on chronically or oxycodone as needed not both. Will defer pain management to primary team.  #8 depression/anxiety Continue Zoloft. Resume Xanax as needed.  #9 leukocytosis Likely stress-induced. UA is negative. Chest x-ray is negative. Patient  currently afebrile. WBC trending down. Follow.  #10 prophylaxis Heparin for DVT prophylaxis.  Code Status: Full Family Communication: Updated patient, husband, daughter at bedside Disposition Plan: Per primary team   Consultants:    Procedures:  CT head 08/16/13  CXR 08/16/13  Antibiotics:  None  HPI/Subjective: Patient more alert today, to self, place and time. Family feels patient close to baseline.  Objective: Filed Vitals:   08/17/13 0752  BP:   Pulse:   Temp:   Resp: 16    Intake/Output Summary (Last 24 hours) at 08/17/13 1435 Last data filed at 08/17/13 1109  Gross per 24 hour  Intake   1338 ml  Output   2125 ml  Net   -787 ml   Filed Weights   08/16/13 0312  Weight: 79.6 kg (175 lb 7.8 oz)    Exam:   General:  Alert ff commands.  Cardiovascular: RRR  Respiratory: CTAB  Abdomen: Soft/NT/ND/+BS  Musculoskeletal: 4/5 bue STRENGTH, 3-4/5 ble STRENGTH  Data Reviewed: Basic Metabolic Panel:  Recent Labs Lab 08/13/13 0503 08/14/13 0421 08/15/13 2030 08/16/13 0725 08/17/13 0432  NA 136 140 134* 139 141  K 3.5 3.3* 4.1 4.2 4.0  CL 99 101 99 103 107  CO2 29 34* $Remo'26 27 29  'sPqac$ GLUCOSE 253* 122* 75 61* 187*  BUN 24* 27* 47* 49* 33*  CREATININE 1.21* 1.49* 2.46* 2.31* 1.28*  CALCIUM 8.6 9.5 8.9 8.2* 8.3*  MG  --   --   --   --  1.8   Liver Function Tests:  Recent Labs Lab 08/15/13 2030 08/16/13 0725  AST 16 24  ALT 6 9  ALKPHOS 55 47  BILITOT 0.1* 0.2*  PROT 6.3 5.7*  ALBUMIN 2.7* 2.4*   No results found for this basename: LIPASE, AMYLASE,  in the  last 168 hours No results found for this basename: AMMONIA,  in the last 168 hours CBC:  Recent Labs Lab 08/14/13 0421 08/15/13 0445 08/15/13 2030 08/16/13 0725 08/17/13 0432  WBC 16.0* 12.5* 13.0* 11.4* 7.9  NEUTROABS  --   --  8.8* 7.9* 6.0  HGB 11.2* 11.0* 10.7* 10.3* 9.8*  HCT 34.6* 34.1* 33.4* 33.3* 31.8*  MCV 79.7 80.8 81.5 82.6 81.7  PLT 254 218 238 234 241   Cardiac  Enzymes: No results found for this basename: CKTOTAL, CKMB, CKMBINDEX, TROPONINI,  in the last 168 hours BNP (last 3 results) No results found for this basename: PROBNP,  in the last 8760 hours CBG:  Recent Labs Lab 08/16/13 1337 08/16/13 1536 08/16/13 1711 08/16/13 2040 08/17/13 0707  GLUCAP 174* 230* 277* 254* 174*    Recent Results (from the past 240 hour(s))  URINE CULTURE     Status: None   Collection Time    08/15/13  8:53 PM      Result Value Range Status   Specimen Description URINE, CATHETERIZED   Final   Special Requests NONE   Final   Culture  Setup Time     Final   Value: 08/16/2013 02:06     Performed at [facility] Count     Final   Value: NO GROWTH     Performed at [facility]   Culture     Final   Value: NO GROWTH     Performed at [facility]   Report Status 08/17/2013 FINAL   Final  CULTURE, BLOOD (ROUTINE X 2)     Status: None   Collection Time    08/16/13  8:22 AM      Result Value Range Status   Specimen Description BLOOD LEFT ARM   Final   Special Requests     Final   Value: BOTTLES DRAWN AEROBIC AND ANAEROBIC 10CC BOTH BOTTLES   Culture  Setup Time     Final   Value: 08/16/2013 16:35     Performed at [facility]   Culture     Final   Value:        BLOOD CULTURE RECEIVED NO GROWTH TO DATE CULTURE WILL BE HELD FOR 5 DAYS BEFORE ISSUING A FINAL NEGATIVE REPORT     Performed at [facility]   Report Status PENDING   Incomplete  CULTURE, BLOOD (ROUTINE X 2)     Status: None   Collection Time    08/16/13  8:30 AM      Result Value Range Status   Specimen Description BLOOD LEFT ARM   Final   Special Requests     Final   Value: BOTTLES DRAWN AEROBIC AND ANAEROBIC 10CC BOTH BOTTLES   Culture  Setup Time     Final   Value: 08/16/2013 16:35     Performed at [facility]   Culture     Final   Value:        BLOOD CULTURE RECEIVED NO GROWTH TO DATE CULTURE WILL BE HELD FOR 5 DAYS BEFORE  ISSUING A FINAL NEGATIVE REPORT     Performed at [facility]   Report Status PENDING   Incomplete     Studies: Dg Chest 2 View  08/15/2013   *RADIOLOGY REPORT*  Clinical Data: Fever after hip surgery.  CHEST - 2 VIEW  Comparison: None.  Findings: Low volume lungs, especially in the lateral projection. No focal infiltrate,  edema, pleural effusion, or pneumothorax.  Normal heart size and mediastinal contours.  Extensive cervical and upper thoracic ACDF with ventral plate screw fixation.  IMPRESSION: Negative for pneumonia or other acute abnormality.   Original Report Authenticated By: Jorje Guild   Ct Head Wo Contrast  08/16/2013   *RADIOLOGY REPORT*  Clinical Data: Altered mental status  CT HEAD WITHOUT CONTRAST  Technique:  Contiguous axial images were obtained from the base of the skull through the vertex without contrast.  Comparison: None.  Findings: There is no acute intracranial hemorrhage or infarct.  No midline shift or mass lesion.  CSF containing spaces are normal. No extra-axial fluid collection.  Gray-white matter differentiation is preserved.  Calvarium is intact.  Orbital soft tissues are normal.  Paranasal sinuses and mastoid air cells are well pneumatized and free fluid.  IMPRESSION: Normal head CT with no acute intracranial process.   Original Report Authenticated By: Jeannine Boga, M.D.   US Renal  08/16/2013   *RADIOLOGY REPORT*  Clinical Data: Acute renal failure  RENAL/URINARY TRACT ULTRASOUND COMPLETE  Comparison:  None.  Findings:  Right Kidney:  13 cm length.  Normal cortex and echogenicity.  No hydronephrosis, focal abnormality or acute process  Left Kidney:  11.1 cm length.  Normal cortex and echogenicity.  No hydronephrosis, focal abnormality, or acute process  Bladder:  Collapsed by Foley catheter  IMPRESSION: No acute finding.  Negative for hydronephrosis   Original Report Authenticated By: Jerilynn Mages. Annamaria Boots, M.D.   Dg Chest Port 1 View  08/16/2013   *RADIOLOGY  REPORT*  Clinical Data: 67 year old female with fever and shortness of breath.  PORTABLE CHEST - 1 VIEW  Comparison: 08/15/2013  Findings:  The cardiomediastinal silhouette is unremarkable. Mild peribronchial thickening is again identified. There is no evidence of focal airspace disease, pulmonary edema, suspicious pulmonary nodule/mass, pleural effusion, or pneumothorax. No acute bony abnormalities are identified. The cervical surgical hardware again noted.  IMPRESSION: No evidence of acute cardiopulmonary disease.   Original Report Authenticated By: Margarette [place], M.D.    Scheduled Meds: . antiseptic oral rinse  15 mL Mouth Rinse BID  . docusate sodium  100 mg Oral BID  . heparin subcutaneous  5,000 Units Subcutaneous Q8H  . insulin aspart  0-15 Units Subcutaneous TID WC  . insulin detemir  25 Units Subcutaneous QHS  . levothyroxine  50 mcg Oral QAC breakfast  . lidocaine  1 patch Transdermal Q24H  . lubiprostone  24 mcg Oral BID WC  . sertraline  100 mg Oral QPM   Continuous Infusions: . sodium chloride 100 mL/hr at 08/17/13 1358    Principal Problem:   Acute encephalopathy Active Problems:   OA (osteoarthritis) of hip   Dehydration   ARF (acute renal failure)   HTN (hypertension)   Diabetes   Hypoglycemia   Chronic pain syndrome   S/P total hip arthroplasty   Unspecified hypothyroidism   Depression   Anxiety state, unspecified    Time spent: > 35 mins    [facility] Pager [phone]. If 7PM-7AM, please contact night-coverage at [email or web address], password [email or web address] 08/17/2013, 2:35 PM  LOS: 2 days

## 2013-08-17 NOTE — Evaluation (Signed)
Occupational Therapy Evaluation Patient Details Name: Robin Brewer MRN: [id number] DOB: December 26, 1945 Today's Date: 08/17/2013 Time: 4259-5638 OT Time Calculation (min): 22 min  OT Assessment / Plan / Recommendation History of present illness Pt had recent R direct anterior THA, went home then was readmitted same day with fever and AMS.    Clinical Impression   Pt was readmitted with confusion/hallucinations on the day she was d/c'd from the hospital.  She is s/p R anterior direct THA.  She will benefit from skilled OT in acute for the goals listed below.      OT Assessment  Patient needs continued OT Services    Follow Up Recommendations  No OT follow up (likely)    Barriers to Discharge      Equipment Recommendations  None recommended by OT    Recommendations for Other Services    Frequency  Min 2X/week    Precautions / Restrictions Precautions Precautions: Fall Restrictions Weight Bearing Restrictions: No Other Position/Activity Restrictions: WBAT   Pertinent Vitals/Pain No pain reported.  Sats 98% RA    ADL  Grooming: Teeth care;Set up Where Assessed - Grooming: Supported sitting Upper Body Bathing: Set up Where Assessed - Upper Body Bathing: Unsupported sitting Lower Body Bathing: Moderate assistance Where Assessed - Lower Body Bathing: Supported sit to stand Upper Body Dressing: Minimal assistance Where Assessed - Upper Body Dressing: Unsupported sitting Lower Body Dressing: Moderate assistance Where Assessed - Lower Body Dressing: Supported sit to Lobbyist: Minimal assistance Armed forces technical officer Method: Arts development officer: Therapist, occupational and Hygiene: +1 Total assistance Where Assessed - Best boy and Hygiene: Sit to stand from 3-in-1 or toilet Equipment Used: Rolling walker Transfers/Ambulation Related to ADLs: ambulated to hall with min A ADL Comments: pt did not feel she  could help with wiping:  has difficulty reaching.  Will introduct toilet aide on next visit    OT Diagnosis: Generalized weakness  OT Problem List: Decreased strength;Decreased activity tolerance;Decreased knowledge of use of DME or AE OT Treatment Interventions: Self-care/ADL training;DME and/or AE instruction;Patient/family education   OT Goals(Current goals can be found in the care plan section) Acute Rehab OT Goals Patient Stated Goal: none stated OT Goal Formulation: With patient Time For Goal Achievement: 08/24/13 Potential to Achieve Goals: Good ADL Goals Pt Will Perform Grooming: with supervision;standing Pt Will Transfer to Toilet: with supervision;ambulating;bedside commode Pt Will Perform Toileting - Clothing Manipulation and hygiene: with supervision;with adaptive equipment;sit to/from stand  Visit Information  Last OT Received On: 08/17/13 Assistance Needed: +1 History of Present Illness: Pt had recent R direct anterior THA, went home then was readmitted same day with fever and AMS.        Prior [place] expects to be discharged to:: Private residence Living Arrangements: Spouse/significant other Available Help at Discharge: Family Type of Home: House Home Access: Stairs to enter Technical brewer of Steps: 2 Entrance Stairs-Rails: None Home Layout: One level [place]: Environmental consultant - 4 wheels Prior Function Level of Independence: Needs assistance Communication Communication: No difficulties Dominant Hand: Right         Vision/Perception     Cognition  Cognition Arousal/Alertness: Awake/alert Behavior During Therapy: WFL for tasks assessed/performed Overall Cognitive Status: Within Functional Limits for tasks assessed (mostly) Memory: Decreased short-term memory    Extremity/Trunk Assessment Upper Extremity Assessment Upper Extremity Assessment: Overall WFL for tasks assessed     Mobility Bed  Mobility Bed Mobility:  Supine to Sit Supine to Sit: 3: Mod assist Supine to Sit: Patient Percentage: 60% Details for Bed Mobility Assistance: cues for sequencing and technique, mod A for raising trunk Transfers Sit to Stand: 4: Min assist;From chair/3-in-1;From bed;With armrests;With upper extremity assist Sit to Stand: Patient Percentage: 80% Stand to Sit: 4: Min assist;To chair/3-in-1;With upper extremity assist;With armrests Stand to Sit: Patient Percentage: 80% Details for Transfer Assistance: cues for UE/LE placement     Exercise      Balance     End of Session OT - End of Session Activity Tolerance: Patient tolerated treatment well Patient left: in chair;with call bell/phone within reach;with chair alarm set  Melissa 08/17/2013, 12:24 PM Lesle Chris, OTR/L [phone] 08/17/2013

## 2013-08-17 NOTE — Evaluation (Signed)
Clinical/Bedside Swallow Evaluation Patient Details  Name: Robin Brewer MRN: [id number] Date of Birth: 1946/11/14  Today's Date: 08/17/2013 Time: 1230-1245 SLP Time Calculation (min): 15 min  Past Medical History:  Past Medical History  Diagnosis Date  . Hypertension   . Hypothyroidism   . Anxiety   . Depression   . Shortness of breath     with exertion   . Bronchitis     hx of   . Diabetes mellitus without complication   . Arthritis    Past Surgical History:  Past Surgical History  Procedure Laterality Date  . Abdominal hysterectomy    . Back surgery    . Neck surgery    . Total hip arthroplasty Right 08/12/2013    Procedure: RIGHT TOTAL HIP ARTHROPLASTY ANTERIOR APPROACH;  Surgeon: [name], MD;  Location: [facility];  Service: Orthopedics;  Laterality: Right;   HPI:  67 yo female adm to [facility] with leukocytosis, fever and AMS.  PMH + for right THA 08/12/13, bronchitis, SOB.  Order for swallow evaluation received.     Assessment / Plan / Recommendation Clinical Impression  Pt presents with functional oropharyngeal swallow ability based on clinical swallow evaluation-  She does not have cranial nerve deficits, is fully alert and family reports confusion is much better than when admitted.  Pt accepted only a single sip of liquid and single bite of cracker - nearly full lunch tray at bedside.  Slow mastication noted due to pt's resistance to eat  - not dysphagia.   Suspect AMS was source of swallowing issues upon admit.    Pt does admit to occasional pill dysphagia or which she manages by taking pills with applesauce.  Rec continue regular/thin diet - ? if pt would benefit from nutritional supplement given poor intake/appetite.  SLP educated pt and family to findings, xerostomia compensations including providing sample of biotene, no further slp indicated.    Aspiration Risk  Mild    Diet Recommendation Regular;Thin liquid   Liquid Administration via: Cup;Straw Medication  Administration:  (as tolerated) Supervision: Patient able to self feed Compensations: Slow rate;Small sips/bites Postural Changes and/or Swallow Maneuvers: Seated upright 90 degrees;Upright 30-60 min after meal    Other  Recommendations Oral Care Recommendations: Oral care before and after PO   Follow Up Recommendations  None    Frequency and Duration        Pertinent Vitals/Pain Afebrile,decreased    SLP Swallow Goals  n/a   Swallow Study Prior Functional Status  Type of Home: House Available Help at Discharge: Family    General Date of Onset: 08/17/13 HPI: 67 yo female adm to [facility] with leukocytosis, fever and AMS.  PMH + for right THA 08/12/13, bronchitis, SOB.  Order for swallow evaluation received.   Type of Study: Bedside swallow evaluation Diet Prior to this Study: Regular;Thin liquids Temperature Spikes Noted: No Respiratory Status: Room air History of Recent Intubation: No Behavior/Cognition: Alert;Cooperative;Pleasant mood Oral Cavity - Dentition: Adequate natural dentition Self-Feeding Abilities: Able to feed self Patient Positioning: Upright in chair Baseline Vocal Quality: Clear Volitional Swallow: Able to elicit    Oral/Motor/Sensory Function Overall Oral Motor/Sensory Function: Appears within functional limits for tasks assessed   Ice Chips Ice chips: Not tested   Thin Liquid Thin Liquid: Within functional limits Presentation: Straw    Nectar Thick Nectar Thick Liquid: Not tested   Honey Thick Honey Thick Liquid: Not tested   Puree Puree: Not tested   Solid   GO  Solid: Within functional limits Other Comments: cracker, pt shook her head stating she couldn't eat it due to lack of appetite, with encouragement, pt swallowed to clear       Luanna Salk, [place] [facility] SLP [phone]

## 2013-08-18 LAB — CBC
HCT: 30.1 % — ABNORMAL LOW (ref 36.0–46.0)
MCV: 78.8 fL (ref 78.0–100.0)
Platelets: 313 10*3/uL (ref 150–400)
RBC: 3.82 MIL/uL — ABNORMAL LOW (ref 3.87–5.11)
RDW: 14.1 % (ref 11.5–15.5)
WBC: 8.6 10*3/uL (ref 4.0–10.5)

## 2013-08-18 LAB — BASIC METABOLIC PANEL
CO2: 28 mEq/L (ref 19–32)
Calcium: 8.9 mg/dL (ref 8.4–10.5)
Chloride: 105 mEq/L (ref 96–112)
GFR calc Af Amer: 60 mL/min — ABNORMAL LOW (ref >90)
Sodium: 141 mEq/L (ref 135–145)

## 2013-08-18 LAB — GLUCOSE, CAPILLARY
Glucose-Capillary: 183 mg/dL — ABNORMAL HIGH (ref 70–99)
Glucose-Capillary: 121 mg/dL — ABNORMAL HIGH (ref 70–99)
Glucose-Capillary: 161 mg/dL — ABNORMAL HIGH (ref 70–99)

## 2013-08-18 MED ORDER — LORAZEPAM 2 MG/ML IJ SOLN
0.5000 mg | Freq: Once | INTRAMUSCULAR | Status: AC
  Administered 2013-08-18: 0.5 mg via INTRAVENOUS
  Filled 2013-08-18: qty 1

## 2013-08-18 MED ORDER — POTASSIUM CHLORIDE CRYS ER 20 MEQ PO TBCR
40.0000 meq | EXTENDED_RELEASE_TABLET | ORAL | Status: AC
  Administered 2013-08-18 (×2): 40 meq via ORAL
  Filled 2013-08-18 (×2): qty 2

## 2013-08-18 MED ORDER — MORPHINE SULFATE ER 30 MG PO TBCR
45.0000 mg | EXTENDED_RELEASE_TABLET | Freq: Three times a day (TID) | ORAL | Status: DC
  Administered 2013-08-18 – 2013-08-19 (×4): 45 mg via ORAL
  Filled 2013-08-18 (×3): qty 1
  Filled 2013-08-18: qty 3
  Filled 2013-08-18 (×3): qty 1

## 2013-08-18 MED ORDER — ATENOLOL 50 MG PO TABS
50.0000 mg | ORAL_TABLET | Freq: Once | ORAL | Status: AC
  Administered 2013-08-18: 50 mg via ORAL
  Filled 2013-08-18: qty 1

## 2013-08-18 MED ORDER — ENOXAPARIN SODIUM 40 MG/0.4ML ~~LOC~~ SOLN
40.0000 mg | SUBCUTANEOUS | Status: DC
  Administered 2013-08-18 – 2013-08-19 (×2): 40 mg via SUBCUTANEOUS
  Filled 2013-08-18 (×2): qty 0.4

## 2013-08-18 MED ORDER — OXYMORPHONE HCL ER 15 MG PO TB12: 15.0000 mg | ORAL_TABLET | Freq: Three times a day (TID) | ORAL | Status: DC

## 2013-08-18 MED ORDER — ATENOLOL 50 MG PO TABS
50.0000 mg | ORAL_TABLET | Freq: Every day | ORAL | Status: DC
  Administered 2013-08-18: 50 mg via ORAL
  Filled 2013-08-18: qty 1

## 2013-08-18 MED ORDER — AMLODIPINE BESYLATE 10 MG PO TABS
10.0000 mg | ORAL_TABLET | Freq: Every day | ORAL | Status: DC
  Administered 2013-08-18 – 2013-08-19 (×2): 10 mg via ORAL
  Filled 2013-08-18 (×2): qty 1

## 2013-08-18 MED ORDER — ATENOLOL 100 MG PO TABS
100.0000 mg | ORAL_TABLET | Freq: Every day | ORAL | Status: DC
  Administered 2013-08-19: 100 mg via ORAL
  Filled 2013-08-18: qty 1

## 2013-08-18 NOTE — Progress Notes (Signed)
Subjective: POD 6 from Right Total Hip Anterior Approach Patient reports pain as mild and moderate.   Patient was readmitted on Saturday evening after going home with family. Brought back in with mental status changes and elevated temperature. Has been undergoing workup by Medicine Team.  Objective: Vital signs in last 24 hours: Temp:  [98 F (36.7 C)-98.7 F (37.1 C)] 98 F (36.7 C) (09/02 0529) Pulse Rate:  [77-87] 85 (09/02 0529) Resp:  [14-16] 16 (09/02 0529) BP: (192-202)/(61-92) 202/73 mmHg (09/02 0529) SpO2:  [95 %-99 %] 95 % (09/02 0529)  Intake/Output from previous day: 09/01 0701 - 09/02 0700 In: 1800 [P.O.:300; I.V.:1500] Out: 1600 [Urine:1600]   Recent Labs  08/15/13 2030 08/16/13 0725 08/17/13 0432 08/18/13 0454  HGB 10.7* 10.3* 9.8* 9.8*    Recent Labs  08/17/13 0432 08/18/13 0454  WBC 7.9 8.6  RBC 3.89 3.82*  HCT 31.8* 30.1*  PLT 241 313    Recent Labs  08/17/13 0432 08/18/13 0454  NA 141 141  K 4.0 3.1*  CL 107 105  CO2 29 28  BUN 33* 24*  CREATININE 1.28* 1.08  GLUCOSE 187* 160*  CALCIUM 8.3* 8.9    Recent Labs  08/16/13 0725  INR 1.15    EXAM General - Patient is Alert and but appears in mild distress Extremity - Neurovascular intact Sensation intact distally Dorsiflexion/Plantar flexion intact No cellulitis present Dressing - dressing C/D/I, Incision looks excellent. Motor Function - intact, moving foot and toes well on exam.   Past Medical History  Diagnosis Date  . Hypertension   . Hypothyroidism   . Anxiety   . Depression   . Shortness of breath     with exertion   . Bronchitis     hx of   . Diabetes mellitus without complication   . Arthritis   . HTN (hypertension) 08/17/2013  . Diabetes 08/17/2013  . Chronic pain syndrome 08/17/2013  . S/P total hip arthroplasty 08/17/2013    Right. 08/12/13  . Unspecified hypothyroidism 08/17/2013  . Anxiety state, unspecified 08/17/2013    Assessment/Plan: POD 6 from Right  Total Hip Anterior Approach Principal Problem:   Acute encephalopathy Active Problems:   OA (osteoarthritis) of hip   Dehydration   ARF (acute renal failure)   HTN (hypertension)   Diabetes   Hypoglycemia   Chronic pain syndrome   S/P total hip arthroplasty   Unspecified hypothyroidism   Depression   Anxiety state, unspecified  Estimated body mass index is 32.09 kg/(m^2) as calculated from the following:   Height as of this encounter: $RemoveBeforeD'5\' 2"'[id number]$  (1.575 m).   Weight as of this encounter: 79.6 kg (175 lb 7.8 oz).  Has been on Opana for years.  Will restart at home dosing regimen.  DVT Prophylaxis - Heparin Weight-Bearing as tolerated to right leg   PERKINS, ALEXZANDREW 08/18/2013, 7:28 AM

## 2013-08-18 NOTE — Progress Notes (Signed)
08/18/13- 01:13-Pt very upset , after awake from sleep after pain med & xanax given earlier. She is irratable & teary eyed, & c/o nausea & occassional dry heaves, Zofran IVP given. Attempted to call her husband as the pt requested, but can't get thru their home line even with the operators assistance. I even attempted to call from nsg station on 4E, left msg's on home & cell, but no response. NP with [facility] group gave order for Ativan.$RemoveBeforeDE'5mg'[id number]$  to see if this would calm the pt down, due to delirium behaviors. & NP also ask if pt could be relocated to another room. At 03:55am the pt transferred to room#1403, the pt is caler at this time, but still trying to call her husband. Will con't to monitor & provide safety for the pt.

## 2013-08-18 NOTE — Progress Notes (Signed)
Physical Therapy Treatment Patient Details Name: Robin Brewer MRN: [id number] DOB: 10-31-1946 Today's Date: 08/18/2013 Time: 4098-1191 PT Time Calculation (min): 13 min  PT Assessment / Plan / Recommendation  History of Present Illness Pt had recent R direct anterior THA, went home then was readmitted same day with fever and AMS.    PT Comments   *Pt c/o 6/10 headache, RN notified. Pt had nausea and dry heaving during tx. RN notified. Bed to recliner with min A, therapeutic exercise RLE completed. Activity limited by nausea. **  Follow Up Recommendations  Home health PT     Does the patient have the potential to tolerate intense rehabilitation     Barriers to Discharge        Equipment Recommendations  None recommended by PT    Recommendations for Other Services OT consult  Frequency 7X/week   Progress towards PT Goals Progress towards PT goals: Not progressing toward goals - comment (nausea limiting activity tolerance)  Plan Current plan remains appropriate    Precautions / Restrictions Precautions Precautions: Fall Restrictions Weight Bearing Restrictions: No Other Position/Activity Restrictions: WBAT   Pertinent Vitals/Pain *6/10 headache RN aware**    Mobility  Bed Mobility Supine to Sit: 3: Mod assist Supine to Sit: Patient Percentage: 60% Details for Bed Mobility Assistance: cues for sequencing and technique, mod A for raising trunk Transfers Transfers: Stand Pivot Transfers Sit to Stand: 4: Min assist;From chair/3-in-1;From bed;With armrests;With upper extremity assist Stand to Sit: 4: Min assist;To chair/3-in-1;With upper extremity assist;With armrests Stand Pivot Transfers: 4: Min assist Details for Transfer Assistance: cues for UE/LE placement; assist to advance RLE and to raise trunk Ambulation/Gait Ambulation/Gait Assistance: Not tested (comment) (pt nauseous and dry heaving, gait deferred)    Exercises Total Joint Exercises Ankle Circles/Pumps:  AROM;15 reps;Supine;Both Heel Slides: AAROM;Supine;Right;10 reps Hip ABduction/ADduction: AAROM;Supine;Right;10 reps   PT Diagnosis:    PT Problem List:   PT Treatment Interventions:     PT Goals (current goals can now be found in the care plan section) Acute Rehab PT Goals Patient Stated Goal: Resume previous lifestyle with decreased pain PT Goal Formulation: With patient Potential to Achieve Goals: Fair  Visit Information  Last PT Received On: 08/18/13 Assistance Needed: +1 History of Present Illness: Pt had recent R direct anterior THA, went home then was readmitted same day with fever and AMS.     Subjective Data  Patient Stated Goal: Resume previous lifestyle with decreased pain   Cognition  Cognition Arousal/Alertness: Awake/alert Behavior During Therapy: WFL for tasks assessed/performed Overall Cognitive Status: Within Functional Limits for tasks assessed (mostly) Memory: Decreased short-term memory    Balance     End of Session PT - End of Session Equipment Utilized During Treatment: Gait belt Activity Tolerance: Treatment limited secondary to medical complications (Comment) (nausea and dry heaving) Patient left: with call bell/phone within reach;in chair;with chair alarm set Nurse Communication: Mobility status   GP     Robin Brewer 08/18/2013, 10:47 AM [phone]

## 2013-08-18 NOTE — Progress Notes (Signed)
[facility] PROGRESS NOTE/CONSULT  Robin Brewer WCB:[id number] DOB: [birth year] DOA: 08/15/2013 PCP: Nicoletta Dress, MD  Assessment/Plan: #1 acute encephalopathy Likely secondary to narcotic pain medication buildup in the setting of acute renal failure. Urinalysis is negative. Chest x-ray is negative. Patient currently afebrile. CT of the head is negative. Patient with no focal neurological deficits. Clinical improvement. Follow. Patient currently on a Lidoderm patch. Opana has been resumed per primary team. Monitor mental status closely.  #2 acute renal failure Likely secondary to prerenal azotemia in the setting of hypotension and ACE inhibitor, ARB, diuretics. Renal ultrasound negative for hydronephrosis. Improving with hydration. Continue to avoid nephrotoxic medications. NSL IV fluids. Follow.  #3 postop anemia H&H stable. Follow.  #4 hypertension Blood pressure elevated today. Resume norvasc and atenolol and titrate. Holding off on ARB and ACE inhibitor and diuretic secondary to ARF.   #5 diabetes mellitus Hemoglobin A1c is 8.7. CBGs have ranged from 174 - 277. Change to D5 normal saline to normal saline. Patient currently on half home dose of Levaquin. Continue sliding scale insulin. Follow CBGs and up titrate as needed.  #6 hypothyroidism Continue Synthroid.  #7 status post right hip arthroplasty 08/12/2013 Per primary team. Currently a Lidoderm patch. Toradol IV when necessary breakthrough pain. Patient also on Vicodin for breakthrough pain. On discharge for pain management will recommend continuing the Lidoderm patch and either resuming patient's home regimen of Opana 15 mg 3 times a day when necessary for breakthrough pain which patient has been on chronically or oxycodone as needed not both. Patient has been started on home dose opana per primary team. If patient becomes lethargic or AMS consider decreasing dose to 1/2 home dose and changing to as needed. Will defer  pain management to primary team.  #8 depression/anxiety Continue Zoloft. Resumed xanax as needed.  #9 leukocytosis Likely stress-induced. UA is negative. Chest x-ray is negative. Patient currently afebrile. WBC trending down. Follow.  #10 prophylaxis Heparin for DVT prophylaxis.  Code Status: Full Family Communication: Updated patient, husband, daughter at bedside Disposition Plan: Per primary team   Consultants:    Procedures:  CT head 08/16/13  CXR 08/16/13  Antibiotics:  None  HPI/Subjective: Patient more alert today, to self, place and time. Family feels patient close to baseline. No complaints.  Objective: Filed Vitals:   08/18/13 1600  BP:   Pulse:   Temp:   Resp: 18    Intake/Output Summary (Last 24 hours) at 08/18/13 1736 Last data filed at 08/18/13 1610  Gross per 24 hour  Intake 936.25 ml  Output   1726 ml  Net -789.75 ml   Filed Weights   08/16/13 0312  Weight: 79.6 kg (175 lb 7.8 oz)    Exam:   General:  Alert ff commands.  Cardiovascular: RRR  Respiratory: CTAB  Abdomen: Soft/NT/ND/+BS  Musculoskeletal: 4/5 bue STRENGTH, 3-4/5 ble STRENGTH  Data Reviewed: Basic Metabolic Panel:  Recent Labs Lab 08/14/13 0421 08/15/13 2030 08/16/13 0725 08/17/13 0432 08/18/13 0454  NA 140 134* 139 141 141  K 3.3* 4.1 4.2 4.0 3.1*  CL 101 99 103 107 105  CO2 34* $Remov'26 27 29 28  'fmdhtC$ GLUCOSE 122* 75 61* 187* 160*  BUN 27* 47* 49* 33* 24*  CREATININE 1.49* 2.46* 2.31* 1.28* 1.08  CALCIUM 9.5 8.9 8.2* 8.3* 8.9  MG  --   --   --  1.8  --    Liver Function Tests:  Recent Labs Lab 08/15/13 2030 08/16/13 0725  AST 16 24  ALT 6 9  ALKPHOS 55 47  BILITOT 0.1* 0.2*  PROT 6.3 5.7*  ALBUMIN 2.7* 2.4*   No results found for this basename: LIPASE, AMYLASE,  in the last 168 hours No results found for this basename: AMMONIA,  in the last 168 hours CBC:  Recent Labs Lab 08/15/13 0445 08/15/13 2030 08/16/13 0725 08/17/13 0432 08/18/13 0454   WBC 12.5* 13.0* 11.4* 7.9 8.6  NEUTROABS  --  8.8* 7.9* 6.0  --   HGB 11.0* 10.7* 10.3* 9.8* 9.8*  HCT 34.1* 33.4* 33.3* 31.8* 30.1*  MCV 80.8 81.5 82.6 81.7 78.8  PLT 218 238 234 241 313   Cardiac Enzymes: No results found for this basename: CKTOTAL, CKMB, CKMBINDEX, TROPONINI,  in the last 168 hours BNP (last 3 results) No results found for this basename: PROBNP,  in the last 8760 hours CBG:  Recent Labs Lab 08/17/13 2107 08/18/13 0126 08/18/13 0732 08/18/13 1151 08/18/13 1653  GLUCAP 166* 183* 121* 135* 161*    Recent Results (from the past 240 hour(s))  URINE CULTURE     Status: None   Collection Time    08/15/13  8:53 PM      Result Value Range Status   Specimen Description URINE, CATHETERIZED   Final   Special Requests NONE   Final   Culture  Setup Time     Final   Value: 08/16/2013 02:06     Performed at [facility]     Final   Value: NO GROWTH     Performed at [facility]   Culture     Final   Value: NO GROWTH     Performed at [facility]   Report Status 08/17/2013 FINAL   Final  CULTURE, BLOOD (ROUTINE X 2)     Status: None   Collection Time    08/16/13  8:22 AM      Result Value Range Status   Specimen Description BLOOD LEFT ARM   Final   Special Requests     Final   Value: BOTTLES DRAWN AEROBIC AND ANAEROBIC 10CC BOTH BOTTLES   Culture  Setup Time     Final   Value: 08/16/2013 16:35     Performed at [facility]   Culture     Final   Value:        BLOOD CULTURE RECEIVED NO GROWTH TO DATE CULTURE WILL BE HELD FOR 5 DAYS BEFORE ISSUING A FINAL NEGATIVE REPORT     Performed at [facility]   Report Status PENDING   Incomplete  CULTURE, BLOOD (ROUTINE X 2)     Status: None   Collection Time    08/16/13  8:30 AM      Result Value Range Status   Specimen Description BLOOD LEFT ARM   Final   Special Requests     Final   Value: BOTTLES DRAWN AEROBIC AND ANAEROBIC 10CC BOTH BOTTLES   Culture   Setup Time     Final   Value: 08/16/2013 16:35     Performed at [facility]   Culture     Final   Value:        BLOOD CULTURE RECEIVED NO GROWTH TO DATE CULTURE WILL BE HELD FOR 5 DAYS BEFORE ISSUING A FINAL NEGATIVE REPORT     Performed at [facility]   Report Status PENDING   Incomplete  URINE CULTURE     Status: None  Collection Time    08/16/13  8:39 AM      Result Value Range Status   Specimen Description URINE, CATHETERIZED   Final   Special Requests NONE   Final   Culture  Setup Time     Final   Value: 08/16/2013 16:42     Performed at [facility]     Final   Value: NO GROWTH     Performed at [facility]   Culture     Final   Value: NO GROWTH     Performed at [facility]   Report Status 08/17/2013 FINAL   Final     Studies: No results found.  Scheduled Meds: . amLODipine  10 mg Oral Daily  . antiseptic oral rinse  15 mL Mouth Rinse BID  . atenolol  50 mg Oral Daily  . docusate sodium  100 mg Oral BID  . enoxaparin (LOVENOX) injection  40 mg Subcutaneous Q24H  . insulin aspart  0-15 Units Subcutaneous TID WC  . insulin detemir  25 Units Subcutaneous QHS  . levothyroxine  50 mcg Oral QAC breakfast  . lidocaine  1 patch Transdermal Q24H  . lubiprostone  24 mcg Oral BID WC  . morphine  45 mg Oral Q8H  . sertraline  100 mg Oral QPM   Continuous Infusions:    Principal Problem:   Acute encephalopathy Active Problems:   OA (osteoarthritis) of hip   Dehydration   ARF (acute renal failure)   HTN (hypertension)   Diabetes   Hypoglycemia   Chronic pain syndrome   S/P total hip arthroplasty   Unspecified hypothyroidism   Depression   Anxiety state, unspecified    Time spent: > 35 mins    [facility] Pager [phone]. If 7PM-7AM, please contact night-coverage at [email or web address], password [facility] 08/18/2013, 5:36 PM  LOS: 3 days

## 2013-08-18 NOTE — Care Management Note (Addendum)
    Page 1 of 1   08/19/2013     1:42:37 PM   CARE MANAGEMENT NOTE 08/19/2013  Patient:  Robin Brewer, Robin Brewer   Account Number:  [id number]  Date Initiated:  08/18/2013  Documentation initiated by:  Dessa Phi  Subjective/Objective Assessment:   67Y/O F ADMITTED W/ACUTE ENCEPHALOPATHY.WA:QLRJPVG 8/27-8/30/14-R HIP FX.     Action/Plan:   FROM HOME W/SPOUSE.HAS ALL DME.[facility] REFERRED FROM PRIOR ADMISSION.   Anticipated DC Date:  08/19/2013   Anticipated DC Plan:  [place]  CM consult      Choice offered to / List presented to:  C-1 Patient        [place] arranged  HH-2 PT      [place]   Status of service:  Completed, signed off Medicare Important Message given?   (If response is "NO", the following Medicare IM given date fields will be blank) Date Medicare IM given:   Date Additional Medicare IM given:    Discharge Disposition:  [place]  Per UR Regulation:  Reviewed for med. necessity/level of care/duration of stay  If discussed at [place] of Stay Meetings, dates discussed:    Comments:  08/19/13 Terie Lear RN,BSN NCM [employer].  08/18/13 Jamica Woodyard RN,BSN NCM [place].TC [facility] DEBBIE REP INFORMED OF READMIT,& FOLLOWING IF HH NEEDED.

## 2013-08-19 LAB — BASIC METABOLIC PANEL
GFR calc Af Amer: 51 mL/min — ABNORMAL LOW (ref >90)
GFR calc non Af Amer: 44 mL/min — ABNORMAL LOW (ref >90)
Glucose, Bld: 163 mg/dL — ABNORMAL HIGH (ref 70–99)
Potassium: 4 mEq/L (ref 3.5–5.1)
Sodium: 138 mEq/L (ref 135–145)

## 2013-08-19 LAB — GLUCOSE, CAPILLARY
Glucose-Capillary: 85 mg/dL (ref 70–99)
Glucose-Capillary: 74 mg/dL (ref 70–99)

## 2013-08-19 MED ORDER — AMLODIPINE BESYLATE 10 MG PO TABS: 10.0000 mg | ORAL_TABLET | Freq: Every day | ORAL | Status: DC

## 2013-08-19 MED ORDER — ATENOLOL 100 MG PO TABS: 100.0000 mg | ORAL_TABLET | Freq: Every day | ORAL | Status: DC

## 2013-08-19 MED ORDER — OXYMORPHONE HCL ER 15 MG PO TB12: 15.0000 mg | ORAL_TABLET | Freq: Three times a day (TID) | ORAL | Status: DC

## 2013-08-19 MED ORDER — ONDANSETRON HCL 4 MG PO TABS: 4.0000 mg | ORAL_TABLET | Freq: Three times a day (TID) | ORAL | Status: DC | PRN

## 2013-08-19 MED ORDER — BISACODYL 5 MG PO TBEC: 5.0000 mg | DELAYED_RELEASE_TABLET | Freq: Every day | ORAL | Status: DC | PRN

## 2013-08-19 MED ORDER — INSULIN DETEMIR 100 UNIT/ML ~~LOC~~ SOLN: 30.0000 [IU] | Freq: Every day | SUBCUTANEOUS | Status: DC

## 2013-08-19 NOTE — Progress Notes (Signed)
Patient discharge to home,. D/c instructions  And follow up appointments done in the presence of the husband, verbalized understanding. PIV removed no s/s of infiltration or swelling. Surgical  Site, no s/s of infection, dressing intact.

## 2013-08-19 NOTE — Discharge Summary (Signed)
Physician Discharge Summary  Robin Brewer FHQ:[id number] DOB: 27-Jun-1946 DOA: 08/15/2013  PCP: Nicoletta Dress, MD  Admit date: 08/15/2013 Discharge date: 08/19/2013  Recommendations for Outpatient Follow-up:  1. Pt will need to follow up with PCP in 2-3 weeks post discharge 2. Please obtain BMP to evaluate electrolytes and kidney function 3. Please also check CBC to evaluate Hg and Hct levels 4. Please note that patient will be discharged on Xarelto to complete therapy for 2-1/2 more weeks post discharge and at that point she needs to be transitioned to baby aspirin 81 mg by mouth daily as per orthopedics recommendation 5. Please note changes in medical regimen: Amlodipine-losartan was discontinued due to acute renal failure, patient was discharged on Norvasc 10 mg tablet daily 6. In addition, lisinopril was discontinued as well due to acute renal failure 7. Atenolol-chlorthalidone was discontinued and patient was transitioned to atenolol 100 mg tablet by mouth daily 8. Please note that those changes were made to do to acute renal failure and creatinine as high as 2.46. On discharge creatinine is within normal limits 9. If patient requires additional antihypertensive medicine please consider hydralazine or other medicine that is not renally cleared 10. Please also note that the dose of insulin was lowered from 50 units to 30 units daily as patient had poor appetite, her diet was advanced and she is tolerating currently regular diet well but eating fairly small amounts. 11. Dose of insulin can be readjusted based on the sugar control  Discharge Diagnoses: Acute encephalopathy Principal Problem:   Acute encephalopathy Active Problems:   OA (osteoarthritis) of hip   Dehydration   ARF (acute renal failure)   HTN (hypertension)   Diabetes   Hypoglycemia   Chronic pain syndrome   S/P total hip arthroplasty   Unspecified hypothyroidism   Depression   Anxiety state,  unspecified  Discharge Condition: Stable  Diet recommendation: Heart healthy diet discussed in details   History of present illness:  67 y.o. female with past medical history of hypertension, hypothyroidism, diabetes mellitus, who was admitted to [facility] long hospital after hip fracture, requiring right hip surgery. After her discharge home she has been getting progressively more confused and she was brought back to the hospital. There was no reported fevers and chills, no reported chest pain or shortness of breath, no reported abdominal or urinary concerns. Patient was admitted on per orthopedic team and [facility] team has been consulted for further evaluation of altered mental status.  Hospital Course:  Principal Problem:   Acute encephalopathy - This was determined to be secondary to medication effect Opana, imposed on dehydration and poor oral intake and acute renal failure - Nephrotoxins stopped, patient hydrated well with IV fluids and has responded well - She is currently at her baseline mental status Active Problems:   OA (osteoarthritis) of hip, status post right hip arthroplasty 08/12/2013 - Status post right hip surgery, doing well, we'll continue home health physical therapy - Needs followup with orthopedics    Dehydration - Evident by elevated BUN and creatinine, secondary to poor oral intake, medication effect of nephrotoxins  - Patient tolerating regular diet well and this is now resolved   ARF (acute renal failure) - determined to be secondary to azotemia - please note that lisinopril, valsartan, chlorthalidone were discontinued and note changes made above   HTN (hypertension) - pt started on Norvasc 10 mg tablet daily and atenolol 100 mg tablet daily    Diabetes type II - reasonable inpatient control -  please note that the dose of insulin was decreased from 50 units to 30 units daily do to relatively poor oral intake - Please see instructions above, dose of  insulin can be increased if indicated   Chronic pain syndrome - Continue Opana at home    Depression - Stable and at patient's baseline    Anxiety state, unspecified - Continue Xanax   Procedures/Studies: Dg Chest 2 View 08/15/2013  Negative for pneumonia or other acute abnormality. Dg Hip Complete Right 08/06/2013  Advanced osteoarthritic changes of the right hip.    Ct Head Wo Contrast  08/16/2013   Normal head CT with no acute intracranial process.   US Renal  08/16/2013  No acute finding.  Negative for hydronephrosis. Dg Pelvis Portable   08/12/2013  Expected postoperative appearance of the right hip status post total hip arthroplasty, as above. Dg Chest Port 1 View  08/16/2013  No evidence of acute cardiopulmonary disease.     Antibiotics:  None   Discharge Exam: Filed Vitals:   08/19/13 0831  BP: 166/55  Pulse: 58  Temp: 97.7 F (36.5 C)  Resp: 16   Filed Vitals:   08/19/13 0211 08/19/13 0601 08/19/13 0800 08/19/13 0831  BP: 156/65   166/55  Pulse: 67 68  58  Temp: 98.8 F (37.1 C) 97.5 F (36.4 C)  97.7 F (36.5 C)  TempSrc: Oral Oral  Oral  Resp: $Remo'18 18 16 16  'fxysp$ Height:      Weight:      SpO2: 98% 100% 98% 97%    General: Pt is alert, follows commands appropriately, not in acute distress Cardiovascular: Regular rate and rhythm, S1/S2 +, no murmurs, no rubs, no gallops Respiratory: Clear to auscultation bilaterally, no wheezing, no crackles, no rhonchi Abdominal: Soft, non tender, non distended, bowel sounds +, no guarding Extremities: no edema, no cyanosis, pulses palpable bilaterally DP and PT Neuro: Grossly nonfocal  Discharge Instructions  Discharge Orders   Future Orders Complete By Expires   Diet - low sodium heart healthy  As directed    Increase activity slowly  As directed        Medication List    STOP taking these medications       amLODipine-valsartan 10-320 MG per tablet  Commonly known as:  EXFORGE     atenolol-chlorthalidone 50-25 MG per  tablet  Commonly known as:  TENORETIC     lisinopril 40 MG tablet  Commonly known as:  PRINIVIL,ZESTRIL     methocarbamol 500 MG tablet  Commonly known as:  ROBAXIN     oxyCODONE 5 MG immediate release tablet  Commonly known as:  Oxy IR/ROXICODONE      TAKE these medications       ALPRAZolam 0.25 MG tablet  Commonly known as:  XANAX  Take 0.25 mg by mouth 3 (three) times daily. As needed     amLODipine 10 MG tablet  Commonly known as:  NORVASC  Take 1 tablet (10 mg total) by mouth daily.     atenolol 100 MG tablet  Commonly known as:  TENORMIN  Take 1 tablet (100 mg total) by mouth daily.     bisacodyl 5 MG EC tablet  Commonly known as:  DULCOLAX  Take 1 tablet (5 mg total) by mouth daily as needed.     fenofibrate 160 MG tablet  Take 160 mg by mouth every morning.     gabapentin 600 MG tablet  Commonly known as:  NEURONTIN  Take 900-1,200 mg  by mouth 3 (three) times daily. TAKES 1 AND 1/2 TABLETS  2 TIMES DAILY AND 2 TABLETS AT BEDTIME     glimepiride 4 MG tablet  Commonly known as:  AMARYL  Take 4 mg by mouth 2 (two) times daily.     insulin aspart 100 UNIT/ML injection  Commonly known as:  novoLOG  Inject 12 Units into the skin 3 (three) times daily with meals.     insulin detemir 100 UNIT/ML injection  Commonly known as:  LEVEMIR  Inject 0.3 mLs (30 Units total) into the skin daily. Patient takes in the am     levothyroxine 50 MCG tablet  Commonly known as:  SYNTHROID, LEVOTHROID  Take 50 mcg by mouth daily before breakfast.     lubiprostone 24 MCG capsule  Commonly known as:  AMITIZA  Take 24 mcg by mouth 2 (two) times daily with a meal. As needed     ondansetron 4 MG tablet  Commonly known as:  ZOFRAN  Take 1 tablet (4 mg total) by mouth every 8 (eight) hours as needed for nausea.     oxymorphone 15 MG 12 hr tablet  Commonly known as:  OPANA ER  Take 1 tablet (15 mg total) by mouth every 8 (eight) hours.     PROAIR HFA 108 (90 BASE) MCG/ACT  inhaler  Generic drug:  albuterol  Inhale 2 puffs into the lungs every 6 (six) hours as needed for wheezing.     rivaroxaban 10 MG Tabs tablet  Commonly known as:  XARELTO  - Take 1 tablet (10 mg total) by mouth daily with breakfast. Take Xarelto for two and a half more weeks, then discontinue Xarelto.  - Once the patient has completed the blood thinner regimen, then take a Baby 81 mg Aspirin daily for four more weeks.     rosuvastatin 20 MG tablet  Commonly known as:  CRESTOR  Take 20 mg by mouth every evening.     sertraline 100 MG tablet  Commonly known as:  ZOLOFT  Take 100 mg by mouth every evening.     tiZANidine 4 MG tablet  Commonly known as:  ZANAFLEX  Take 4-12 mg by mouth at bedtime. 1-3 TABLETS 1 HOUR PRIOR TO BEDTIME           Follow-up Information   Follow up with [name], MD. Schedule an appointment as soon as possible for a visit on 08/27/2013. (Call [phone] tomorrow to make the appointment)    Specialty:  Orthopedic Surgery   Contact information:   [address] [place] [address] [phone]       Follow up with [facility], MD In 2 weeks.   Specialty:  Internal Medicine   Contact information:   [place] [address] [phone]        The results of significant diagnostics from this hospitalization (including imaging, microbiology, ancillary and laboratory) are listed below for reference.     Microbiology: Recent Results (from the past 240 hour(s))  URINE CULTURE     Status: None   Collection Time    08/15/13  8:53 PM      Result Value Range Status   Specimen Description URINE, CATHETERIZED   Final   Special Requests NONE   Final   Culture  Setup Time     Final   Value: 08/16/2013 02:06     Performed at [facility]     Final  Value: NO GROWTH     Performed at [facility]   Culture     Final   Value: NO GROWTH     Performed at [facility]    Report Status 08/17/2013 FINAL   Final  CULTURE, BLOOD (ROUTINE X 2)     Status: None   Collection Time    08/16/13  8:22 AM      Result Value Range Status   Specimen Description BLOOD LEFT ARM   Final   Special Requests     Final   Value: BOTTLES DRAWN AEROBIC AND ANAEROBIC 10CC BOTH BOTTLES   Culture  Setup Time     Final   Value: 08/16/2013 16:35     Performed at [facility]   Culture     Final   Value:        BLOOD CULTURE RECEIVED NO GROWTH TO DATE CULTURE WILL BE HELD FOR 5 DAYS BEFORE ISSUING A FINAL NEGATIVE REPORT     Performed at [facility]   Report Status PENDING   Incomplete  CULTURE, BLOOD (ROUTINE X 2)     Status: None   Collection Time    08/16/13  8:30 AM      Result Value Range Status   Specimen Description BLOOD LEFT ARM   Final   Special Requests     Final   Value: BOTTLES DRAWN AEROBIC AND ANAEROBIC 10CC BOTH BOTTLES   Culture  Setup Time     Final   Value: 08/16/2013 16:35     Performed at [facility]   Culture     Final   Value:        BLOOD CULTURE RECEIVED NO GROWTH TO DATE CULTURE WILL BE HELD FOR 5 DAYS BEFORE ISSUING A FINAL NEGATIVE REPORT     Performed at [facility]   Report Status PENDING   Incomplete  URINE CULTURE     Status: None   Collection Time    08/16/13  8:39 AM      Result Value Range Status   Specimen Description URINE, CATHETERIZED   Final   Special Requests NONE   Final   Culture  Setup Time     Final   Value: 08/16/2013 16:42     Performed at [facility]     Final   Value: NO GROWTH     Performed at [facility]   Culture     Final   Value: NO GROWTH     Performed at [facility]   Report Status 08/17/2013 FINAL   Final     Labs: Basic Metabolic Panel:  Recent Labs Lab 08/14/13 0421 08/15/13 2030 08/16/13 0725 08/17/13 0432 08/18/13 0454  NA 140 134* 139 141 141  K 3.3* 4.1 4.2 4.0 3.1*  CL 101 99 103 107 105  CO2 34* $Remov'26 27 29 28   'jFswtH$ GLUCOSE 122* 75 61* 187* 160*  BUN 27* 47* 49* 33* 24*  CREATININE 1.49* 2.46* 2.31* 1.28* 1.08  CALCIUM 9.5 8.9 8.2* 8.3* 8.9  MG  --   --   --  1.8  --    Liver Function Tests:  Recent Labs Lab 08/15/13 2030 08/16/13 0725  AST 16 24  ALT 6 9  ALKPHOS 55 47  BILITOT 0.1* 0.2*  PROT 6.3 5.7*  ALBUMIN 2.7* 2.4*   CBC:  Recent Labs Lab 08/15/13 0445 08/15/13 2030 08/16/13 0725 08/17/13 0432 08/18/13  0454  WBC 12.5* 13.0* 11.4* 7.9 8.6  NEUTROABS  --  8.8* 7.9* 6.0  --   HGB 11.0* 10.7* 10.3* 9.8* 9.8*  HCT 34.1* 33.4* 33.3* 31.8* 30.1*  MCV 80.8 81.5 82.6 81.7 78.8  PLT 218 238 234 241 313   CBG:  Recent Labs Lab 08/18/13 0732 08/18/13 1151 08/18/13 1653 08/18/13 2057 08/19/13 0721  GLUCAP 121* 135* 161* 157* 74     SIGNED: Time coordinating discharge: Over 30 minutes  Faye Ramsay, MD  [facility] 08/19/2013, 10:14 AM Pager [phone]  If 7PM-7AM, please contact night-coverage [email or web address] Password [email or web address]

## 2013-08-19 NOTE — Progress Notes (Signed)
Subjective: Patient feeling much better. Nausea improved. Wants to go home today if possible   Objective: Vital signs in last 24 hours: Temp:  [97.5 F (36.4 C)-98.8 F (37.1 C)] 97.5 F (36.4 C) (09/03 0601) Pulse Rate:  [66-70] 68 (09/03 0601) Resp:  [16-18] 18 (09/03 0601) BP: (156-200)/(56-94) 156/65 mmHg (09/03 0211) SpO2:  [98 %-100 %] 100 % (09/03 0601)  Intake/Output from previous day: 09/02 0701 - 09/03 0700 In: 360 [P.O.:360] Out: 1507 [Urine:1505; Emesis/NG output:1; Stool:1] Intake/Output this shift:     Recent Labs  08/16/13 0725 08/17/13 0432 08/18/13 0454  HGB 10.3* 9.8* 9.8*    Recent Labs  08/17/13 0432 08/18/13 0454  WBC 7.9 8.6  RBC 3.89 3.82*  HCT 31.8* 30.1*  PLT 241 313    Recent Labs  08/17/13 0432 08/18/13 0454  NA 141 141  K 4.0 3.1*  CL 107 105  CO2 29 28  BUN 33* 24*  CREATININE 1.28* 1.08  GLUCOSE 187* 160*  CALCIUM 8.3* 8.9    Recent Labs  08/16/13 0725  INR 1.15    Neurologically intact Neurovascular intact Incision: dressing C/D/I and no drainage Alert and oriented x 3  Assessment/Plan: S/P right THA- Doing much better. May go home today if she can eat well. Renal function has normalized and will restart xarelto once discharged.    [name] 08/19/2013, 7:16 AM

## 2013-08-19 NOTE — Progress Notes (Signed)
Physical Therapy Treatment Patient Details Name: Robin Brewer MRN: [id number] DOB: 03-05-1946 Today's Date: 08/19/2013 Time: 3300-7622 PT Time Calculation (min): 40 min  PT Assessment / Plan / Recommendation  History of Present Illness Pt had recent R direct anterior THA, went home then was readmitted same day with fever and AMS.    PT Comments   Progressing with mobility. Plan is for d/c home on today. Verified with husband that he remembers technique for stair negotiation-husband verbalizes understanding. No further questions from pt/husband. Ready to d/c from PT standpoint.   Follow Up Recommendations  Home health PT     Does the patient have the potential to tolerate intense rehabilitation     Barriers to Discharge        Equipment Recommendations  None recommended by PT    Recommendations for Other Services OT consult  Frequency 7X/week   Progress towards PT Goals Progress towards PT goals: Progressing toward goals  Plan Current plan remains appropriate    Precautions / Restrictions Precautions Precautions: Fall Restrictions Weight Bearing Restrictions: No Other Position/Activity Restrictions: WBAT   Pertinent Vitals/Pain 6/10 R hip    Mobility  Bed Mobility Bed Mobility: Supine to Sit Supine to Sit: 4: Min assist Details for Bed Mobility Assistance: Assist for R LE initially. VCS safety, technique, sequencing Transfers Transfers: Sit to Stand;Stand to Sit Sit to Stand: 4: Min guard;From toilet;From bed Stand to Sit: 4: Min guard;To chair/3-in-1;To toilet Details for Transfer Assistance: VCs safety, technique, hand placement.  Ambulation/Gait Ambulation/Gait Assistance: 4: Min guard Ambulation Distance (Feet): 60 Feet (15'x1, 60'x1) Assistive device: Rolling walker Ambulation/Gait Assistance Details: slow gait speed.  Gait Pattern: Step-to pattern;Step-through pattern;Decreased stride length    Exercises Total Joint Exercises Ankle Circles/Pumps:  AROM;Both;10 reps;Seated Quad Sets: AROM;Both;10 reps;Seated Heel Slides: AAROM;10 reps;Right;Seated Hip ABduction/ADduction: AAROM;Right;10 reps;Seated   PT Diagnosis:    PT Problem List:   PT Treatment Interventions:     PT Goals (current goals can now be found in the care plan section)    Visit Information  Last PT Received On: 08/19/13 Assistance Needed: +1 History of Present Illness: Pt had recent R direct anterior THA, went home then was readmitted same day with fever and AMS.     Subjective Data      Cognition  Cognition Arousal/Alertness: Awake/alert Behavior During Therapy: WFL for tasks assessed/performed Overall Cognitive Status: Impaired/Different from baseline Area of Impairment: Memory;Following commands Memory: Decreased short-term memory Following Commands: Follows multi-step commands with increased time    Balance     End of Session PT - End of Session Activity Tolerance: Patient tolerated treatment well Patient left: in chair;with call bell/phone within reach;with chair alarm set   GP     Weston Anna, MPT Pager: [phone]

## 2013-08-22 LAB — CULTURE, BLOOD (ROUTINE X 2)
Culture: NO GROWTH
Culture: NO GROWTH

## 2013-08-27 NOTE — Discharge Summary (Signed)
Physician Discharge Summary   Patient ID: Robin Brewer MRN: [id number] DOB/AGE: 04/10/1946 67 y.o.  Admit date: 08/12/2013 Discharge date: 08/15/2013  Primary Diagnosis:  Osteoarthritis of the Right hip.  Admission Diagnoses:  Past Medical History  Diagnosis Date  . Hypertension   . Hypothyroidism   . Anxiety   . Depression   . Shortness of breath     with exertion   . Bronchitis     hx of   . Diabetes mellitus without complication   . Arthritis   . HTN (hypertension) 08/17/2013  . Diabetes 08/17/2013  . Chronic pain syndrome 08/17/2013  . S/P total hip arthroplasty 08/17/2013    Right. 08/12/13  . Unspecified hypothyroidism 08/17/2013  . Anxiety state, unspecified 08/17/2013   Discharge Diagnoses:   Principal Problem:   OA (osteoarthritis) of hip Active Problems:   Postoperative anemia due to acute blood loss   Hypokalemia  Estimated body mass index is 31.09 kg/(m^2) as calculated from the following:   Height as of this encounter: $RemoveBeforeD'5\' 2"'[id number]$  (1.575 m).   Weight as of this encounter: 77.111 kg (170 lb).  Procedure(s) (LRB): RIGHT TOTAL HIP ARTHROPLASTY ANTERIOR APPROACH (Right)   Consults: None  HPI: Robin Brewer is a 67 y.o. female who has advanced end-  stage arthritis of his Right hip with progressively worsening pain and  dysfunction.The patient has failed nonoperative management and presents for  total hip arthroplasty.   Laboratory Data: Admission on 08/15/2013, Discharged on 08/19/2013  No results displayed because visit has over 200 results.    Admission on 08/12/2013, Discharged on 08/15/2013  Component Date Value Range Status  . Glucose-Capillary 08/12/2013 176* 70 - 99 mg/dL Final  . Comment 1 08/12/2013 Documented in Chart   Final  . Glucose-Capillary 08/12/2013 125* 70 - 99 mg/dL Final  . Comment 1 08/12/2013 Documented in Chart   Final  . Comment 2 08/12/2013 Notify RN   Final  . WBC 08/13/2013 12.7* 4.0 - 10.5 K/uL Final  . RBC 08/13/2013 4.17  3.87 -  5.11 MIL/uL Final  . Hemoglobin 08/13/2013 10.5* 12.0 - 15.0 g/dL Final  . HCT 08/13/2013 32.8* 36.0 - 46.0 % Final  . MCV 08/13/2013 78.7  78.0 - 100.0 fL Final  . MCH 08/13/2013 25.2* 26.0 - 34.0 pg Final  . MCHC 08/13/2013 32.0  30.0 - 36.0 g/dL Final  . RDW 08/13/2013 14.0  11.5 - 15.5 % Final  . Platelets 08/13/2013 211  150 - 400 K/uL Final  . Sodium 08/13/2013 136  135 - 145 mEq/L Final  . Potassium 08/13/2013 3.5  3.5 - 5.1 mEq/L Final  . Chloride 08/13/2013 99  96 - 112 mEq/L Final  . CO2 08/13/2013 29  19 - 32 mEq/L Final  . Glucose, Bld 08/13/2013 253* 70 - 99 mg/dL Final  . BUN 08/13/2013 24* 6 - 23 mg/dL Final  . Creatinine, Ser 08/13/2013 1.21* 0.50 - 1.10 mg/dL Final  . Calcium 08/13/2013 8.6  8.4 - 10.5 mg/dL Final  . GFR calc non Af Amer 08/13/2013 45* >90 mL/min Final  . GFR calc Af Amer 08/13/2013 52* >90 mL/min Final   Comment: (NOTE)                          The eGFR has been calculated using the CKD EPI equation.  This calculation has not been validated in all clinical situations.                          eGFR's persistently <90 mL/min signify possible Chronic Kidney                          Disease.  . Glucose-Capillary 08/12/2013 263* 70 - 99 mg/dL Final  . Glucose-Capillary 08/12/2013 329* 70 - 99 mg/dL Final  . Glucose-Capillary 08/13/2013 189* 70 - 99 mg/dL Final  . Glucose-Capillary 08/13/2013 217* 70 - 99 mg/dL Final  . WBC 08/14/2013 16.0* 4.0 - 10.5 K/uL Final  . RBC 08/14/2013 4.34  3.87 - 5.11 MIL/uL Final  . Hemoglobin 08/14/2013 11.2* 12.0 - 15.0 g/dL Final  . HCT 08/14/2013 34.6* 36.0 - 46.0 % Final  . MCV 08/14/2013 79.7  78.0 - 100.0 fL Final  . MCH 08/14/2013 25.8* 26.0 - 34.0 pg Final  . MCHC 08/14/2013 32.4  30.0 - 36.0 g/dL Final  . RDW 08/14/2013 14.1  11.5 - 15.5 % Final  . Platelets 08/14/2013 254  150 - 400 K/uL Final  . Sodium 08/14/2013 140  135 - 145 mEq/L Final  . Potassium 08/14/2013 3.3* 3.5 - 5.1 mEq/L  Final  . Chloride 08/14/2013 101  96 - 112 mEq/L Final  . CO2 08/14/2013 34* 19 - 32 mEq/L Final  . Glucose, Bld 08/14/2013 122* 70 - 99 mg/dL Final  . BUN 08/14/2013 27* 6 - 23 mg/dL Final  . Creatinine, Ser 08/14/2013 1.49* 0.50 - 1.10 mg/dL Final  . Calcium 08/14/2013 9.5  8.4 - 10.5 mg/dL Final  . GFR calc non Af Amer 08/14/2013 35* >90 mL/min Final  . GFR calc Af Amer 08/14/2013 41* >90 mL/min Final   Comment: (NOTE)                          The eGFR has been calculated using the CKD EPI equation.                          This calculation has not been validated in all clinical situations.                          eGFR's persistently <90 mL/min signify possible Chronic Kidney                          Disease.  . Glucose-Capillary 08/13/2013 343* 70 - 99 mg/dL Final  . Glucose-Capillary 08/13/2013 311* 70 - 99 mg/dL Final  . Glucose-Capillary 08/14/2013 78  70 - 99 mg/dL Final  . Comment 1 08/14/2013 Notify RN   Final  . Comment 2 08/14/2013 Documented in Chart   Final  . Glucose-Capillary 08/14/2013 229* 70 - 99 mg/dL Final  . Glucose-Capillary 08/14/2013 136* 70 - 99 mg/dL Final  . Comment 1 08/14/2013 Notify RN   Final  . Comment 2 08/14/2013 Documented in Chart   Final  . WBC 08/15/2013 12.5* 4.0 - 10.5 K/uL Final   WHITE COUNT CONFIRMED ON SMEAR  . RBC 08/15/2013 4.22  3.87 - 5.11 MIL/uL Final  . Hemoglobin 08/15/2013 11.0* 12.0 - 15.0 g/dL Final  . HCT 08/15/2013 34.1* 36.0 - 46.0 % Final  . MCV 08/15/2013 80.8  78.0 - 100.0 fL Final  .  MCH 08/15/2013 26.1  26.0 - 34.0 pg Final  . MCHC 08/15/2013 32.3  30.0 - 36.0 g/dL Final  . RDW 08/15/2013 14.8  11.5 - 15.5 % Final  . Platelets 08/15/2013 218  150 - 400 K/uL Final   PLATELET CLUMPS NOTED ON SMEAR  . Glucose-Capillary 08/15/2013 114* 70 - 99 mg/dL Final  . Glucose-Capillary 08/15/2013 128* 70 - 99 mg/dL Final  . Comment 1 08/15/2013 Testing performed by [place] staff.  Manual entry on 08/19/13 by POCT.   Final  .  Glucose-Capillary 08/14/2013 85  70 - 99 mg/dL Final  . Comment 1 08/14/2013 Testing performed by [place] staff.  Manual entry on 08/19/13 by POCT.   Final  Hospital Outpatient Visit on 08/06/2013  Component Date Value Range Status  . aPTT 08/06/2013 28  24 - 37 seconds Final  . WBC 08/06/2013 13.0* 4.0 - 10.5 K/uL Final  . RBC 08/06/2013 4.99  3.87 - 5.11 MIL/uL Final   QA FLAGS AND/OR RANGES MODIFIED BY DEMOGRAPHIC UPDATE ON 08/27 AT 1127  . Hemoglobin 08/06/2013 12.9  12.0 - 15.0 g/dL Final   QA FLAGS AND/OR RANGES MODIFIED BY DEMOGRAPHIC UPDATE ON 08/27 AT 1127  . HCT 08/06/2013 40.4  36.0 - 46.0 % Final   QA FLAGS AND/OR RANGES MODIFIED BY DEMOGRAPHIC UPDATE ON 08/27 AT 1127  . MCV 08/06/2013 81.0  78.0 - 100.0 fL Final  . MCH 08/06/2013 25.9* 26.0 - 34.0 pg Final  . MCHC 08/06/2013 31.9  30.0 - 36.0 g/dL Final  . RDW 08/06/2013 14.3  11.5 - 15.5 % Final  . Platelets 08/06/2013 298  150 - 400 K/uL Final  . Sodium 08/06/2013 141  135 - 145 mEq/L Final  . Potassium 08/06/2013 3.3* 3.5 - 5.1 mEq/L Final  . Chloride 08/06/2013 101  96 - 112 mEq/L Final  . CO2 08/06/2013 31  19 - 32 mEq/L Final  . Glucose, Bld 08/06/2013 74  70 - 99 mg/dL Final  . BUN 08/06/2013 24* 6 - 23 mg/dL Final  . Creatinine, Ser 08/06/2013 1.48* 0.50 - 1.10 mg/dL Final   QA FLAGS AND/OR RANGES MODIFIED BY DEMOGRAPHIC UPDATE ON 08/27 AT 1127  . Calcium 08/06/2013 9.4  8.4 - 10.5 mg/dL Final  . Total Protein 08/06/2013 6.8  6.0 - 8.3 g/dL Final  . Albumin 08/06/2013 3.4* 3.5 - 5.2 g/dL Final  . AST 08/06/2013 19  0 - 37 U/L Final  . ALT 08/06/2013 13  0 - 35 U/L Final   QA FLAGS AND/OR RANGES MODIFIED BY DEMOGRAPHIC UPDATE ON 08/27 AT 1127  . Alkaline Phosphatase 08/06/2013 69  39 - 117 U/L Final  . Total Bilirubin 08/06/2013 0.2* 0.3 - 1.2 mg/dL Final  . GFR calc non Af Amer 08/06/2013 48* >90 mL/min Final  . GFR calc Af Amer 08/06/2013 55* >90 mL/min Final   Comment: (NOTE)                          The eGFR has  been calculated using the CKD EPI equation.                          This calculation has not been validated in all clinical situations.                          eGFR's persistently <90 mL/min signify possible Chronic Kidney  Disease.  Marland Kitchen Prothrombin Time 08/06/2013 12.0  11.6 - 15.2 seconds Final  . INR 08/06/2013 0.90  0.00 - 1.49 Final  . ABO/RH(D) 08/06/2013 O POS   Final  . Antibody Screen 08/06/2013 NEG   Final  . Sample Expiration 08/06/2013 08/15/2013   Final  . Color, Urine 08/06/2013 YELLOW  YELLOW Final  . APPearance 08/06/2013 CLOUDY* CLEAR Final  . Specific Gravity, Urine 08/06/2013 1.024  1.005 - 1.030 Final  . pH 08/06/2013 5.0  5.0 - 8.0 Final  . Glucose, UA 08/06/2013 NEGATIVE  NEGATIVE mg/dL Final  . Hgb urine dipstick 08/06/2013 NEGATIVE  NEGATIVE Final  . Bilirubin Urine 08/06/2013 NEGATIVE  NEGATIVE Final  . Ketones, ur 08/06/2013 NEGATIVE  NEGATIVE mg/dL Final  . Protein, ur 08/06/2013 >300* NEGATIVE mg/dL Final  . Urobilinogen, UA 08/06/2013 1.0  0.0 - 1.0 mg/dL Final  . Nitrite 08/06/2013 NEGATIVE  NEGATIVE Final  . Leukocytes, UA 08/06/2013 NEGATIVE  NEGATIVE Final  . MRSA, PCR 08/06/2013 NEGATIVE  NEGATIVE Final  . Staphylococcus aureus 08/06/2013 NEGATIVE  NEGATIVE Final   Comment:                                 The Xpert SA Assay (FDA                          approved for NASAL specimens                          in patients over 36 years of age),                          is one component of                          a comprehensive surveillance                          program.  Test performance has                          been validated by [employer] for patients greater                          than or equal to 37 year old.                          It is not intended                          to diagnose infection nor to                          guide or monitor treatment.  . ABO/RH(D) 08/06/2013 O POS    Final  . Squamous Epithelial / LPF 08/06/2013 FEW* RARE Final  . WBC, UA 08/06/2013 0-2  <3 WBC/hpf Final  . Bacteria, UA 08/06/2013 RARE  RARE Final     X-Rays:Dg Chest 2 View  08/15/2013   *  RADIOLOGY REPORT*  Clinical Data: Fever after hip surgery.  CHEST - 2 VIEW  Comparison: None.  Findings: Low volume lungs, especially in the lateral projection. No focal infiltrate, edema, pleural effusion, or pneumothorax.  Normal heart size and mediastinal contours.  Extensive cervical and upper thoracic ACDF with ventral plate screw fixation.  IMPRESSION: Negative for pneumonia or other acute abnormality.   Original Report Authenticated By: Jorje Guild   Dg Hip Complete Right  08/06/2013   *RADIOLOGY REPORT*  Clinical Data: Osteoarthritis right hip, preoperative evaluation  RIGHT HIP - COMPLETE 2+ VIEW  Comparison: None  Findings: Osseous mineralization grossly normal for technique. Advanced osteoarthritic changes of the right hip with joint space narrowing and spur formation. Bone on bone appearance at the superior aspect of the right hip joint with mild subchondral sclerosis and question minimal subchondral cyst formation. Superolateral subluxation of right femoral head. No acute fracture, dislocation or additional bone destruction. SI joints and left hip joint spaces appear preserved.  IMPRESSION: Advanced osteoarthritic changes of the right hip.   Original Report Authenticated By: Lavonia Dana, M.D.   Ct Head Wo Contrast  08/16/2013   *RADIOLOGY REPORT*  Clinical Data: Altered mental status  CT HEAD WITHOUT CONTRAST  Technique:  Contiguous axial images were obtained from the base of the skull through the vertex without contrast.  Comparison: None.  Findings: There is no acute intracranial hemorrhage or infarct.  No midline shift or mass lesion.  CSF containing spaces are normal. No extra-axial fluid collection.  Gray-white matter differentiation is preserved.  Calvarium is intact.  Orbital soft tissues are  normal.  Paranasal sinuses and mastoid air cells are well pneumatized and free fluid.  IMPRESSION: Normal head CT with no acute intracranial process.   Original Report Authenticated By: Jeannine Boga, M.D.   US Renal  08/16/2013   *RADIOLOGY REPORT*  Clinical Data: Acute renal failure  RENAL/URINARY TRACT ULTRASOUND COMPLETE  Comparison:  None.  Findings:  Right Kidney:  13 cm length.  Normal cortex and echogenicity.  No hydronephrosis, focal abnormality or acute process  Left Kidney:  11.1 cm length.  Normal cortex and echogenicity.  No hydronephrosis, focal abnormality, or acute process  Bladder:  Collapsed by Foley catheter  IMPRESSION: No acute finding.  Negative for hydronephrosis   Original Report Authenticated By: Jerilynn Mages. Annamaria Boots, M.D.   Dg Pelvis Portable  08/12/2013   *RADIOLOGY REPORT*  Clinical Data: Postoperative evaluation.  PORTABLE PELVIS  Comparison: 08/06/2013.  Findings: Compared to the prior study there has been interval right total hip arthroplasty.  The femoral and acetabular components of the prosthesis appear well seated without definite periprosthetic fracture or other immediate acute complicating features.  Some gas is present within the joint space in the overlying soft tissues, and there is a surgical drain in place extending into the joint space.  Visualized portions of the bony pelvis and left proximal femur are unremarkable.  IMPRESSION: Expected postoperative appearance of the right hip status post total hip arthroplasty, as above.   Original Report Authenticated By: Vinnie Langton, M.D.   Dg Chest Port 1 View  08/16/2013   *RADIOLOGY REPORT*  Clinical Data: 67 year old female with fever and shortness of breath.  PORTABLE CHEST - 1 VIEW  Comparison: 08/15/2013  Findings:  The cardiomediastinal silhouette is unremarkable. Mild peribronchial thickening is again identified. There is no evidence of focal airspace disease, pulmonary edema, suspicious pulmonary nodule/mass, pleural  effusion, or pneumothorax. No acute bony abnormalities are identified. The cervical surgical hardware again  noted.  IMPRESSION: No evidence of acute cardiopulmonary disease.   Original Report Authenticated By: Margarette Canada, M.D.   Dg C-arm 1-60 Min-no Report  08/12/2013   CLINICAL DATA: right anterior hip   C-ARM 1-60 MINUTES  Fluoroscopy was utilized by the requesting physician.  No radiographic  interpretation.     EKG:No orders found for this or any previous visit.   Hospital Course: Patient was admitted to [facility] and taken to the OR and underwent the above state procedure without complications.  Patient tolerated the procedure well and was later transferred to the recovery room and then to the orthopaedic floor for postoperative care.  They were given PO and IV analgesics for pain control following their surgery.  They were given 24 hours of postoperative antibiotics of  Anti-infectives   Start     Dose/Rate Route Frequency Ordered Stop   08/12/13 1830  ceFAZolin (ANCEF) IVPB 1 g/50 mL premix     1 g 100 mL/hr over 30 Minutes Intravenous Every 6 hours 08/12/13 1517 08/13/13 0112   08/12/13 0930  ceFAZolin (ANCEF) IVPB 2 g/50 mL premix     2 g 100 mL/hr over 30 Minutes Intravenous On call to O.R. 08/12/13 0919 08/12/13 1209     and started on DVT prophylaxis in the form of Xarelto.   PT and OT were ordered for total hip protocol.  The patient was allowed to be WBAT with therapy. Discharge planning was consulted to help with postop disposition and equipment needs.  Patient had a decent night on the evening of surgery.She could already tell a difference from her preop pain. They started to get up OOB with therapy on day one.  Hemovac drain was pulled without difficulty.  Continued to work with therapy into day two.  Dressing was changed on day two and the incision was healing .  By day three, the patient had progressed with therapy and meeting their goals.  Incision was healing well.   Patient was seen in rounds by the weekend staff and was ready to go home.   Discharge Medications: Prior to Admission medications   Medication Sig Start Date End Date Taking? Authorizing Provider  albuterol (PROAIR HFA) 108 (90 BASE) MCG/ACT inhaler Inhale 2 puffs into the lungs every 6 (six) hours as needed for wheezing.   Yes Historical Provider, MD  ALPRAZolam (XANAX) 0.25 MG tablet Take 0.25 mg by mouth 3 (three) times daily. As needed   Yes Historical Provider, MD  fenofibrate 160 MG tablet Take 160 mg by mouth every morning.   Yes Historical Provider, MD  gabapentin (NEURONTIN) 600 MG tablet Take 900-1,200 mg by mouth 3 (three) times daily. TAKES 1 AND 1/2 TABLETS  2 TIMES DAILY AND 2 TABLETS AT BEDTIME   Yes Historical Provider, MD  glimepiride (AMARYL) 4 MG tablet Take 4 mg by mouth 2 (two) times daily.   Yes Historical Provider, MD  insulin aspart (NOVOLOG) 100 UNIT/ML injection Inject 12 Units into the skin 3 (three) times daily with meals.    Yes Historical Provider, MD  levothyroxine (SYNTHROID, LEVOTHROID) 50 MCG tablet Take 50 mcg by mouth daily before breakfast.   Yes Historical Provider, MD  lubiprostone (AMITIZA) 24 MCG capsule Take 24 mcg by mouth 2 (two) times daily with a meal. As needed   Yes Historical Provider, MD  rosuvastatin (CRESTOR) 20 MG tablet Take 20 mg by mouth every evening.   Yes Historical Provider, MD  sertraline (ZOLOFT) 100 MG tablet Take  100 mg by mouth every evening.   Yes Historical Provider, MD  tiZANidine (ZANAFLEX) 4 MG tablet Take 4-12 mg by mouth at bedtime. 1-3 TABLETS 1 HOUR PRIOR TO BEDTIME   Yes Historical Provider, MD  amLODipine (NORVASC) 10 MG tablet Take 1 tablet (10 mg total) by mouth daily. 08/19/13   Theodis Blaze, MD  atenolol (TENORMIN) 100 MG tablet Take 1 tablet (100 mg total) by mouth daily. 08/19/13   Theodis Blaze, MD  bisacodyl (DULCOLAX) 5 MG EC tablet Take 1 tablet (5 mg total) by mouth daily as needed. 08/19/13   Theodis Blaze, MD    insulin detemir (LEVEMIR) 100 UNIT/ML injection Inject 0.3 mLs (30 Units total) into the skin daily. Patient takes in the am 08/19/13   Theodis Blaze, MD  ondansetron (ZOFRAN) 4 MG tablet Take 1 tablet (4 mg total) by mouth every 8 (eight) hours as needed for nausea. 08/19/13   [name], MD  oxymorphone (OPANA ER) 15 MG 12 hr tablet Take 1 tablet (15 mg total) by mouth every 8 (eight) hours. 08/19/13   Theodis Blaze, MD  rivaroxaban (XARELTO) 10 MG TABS tablet Take 1 tablet (10 mg total) by mouth daily with breakfast. Take Xarelto for two and a half more weeks, then discontinue Xarelto. Once the patient has completed the blood thinner regimen, then take a Baby 81 mg Aspirin daily for four more weeks. 08/14/13   Alexzandrew Dara Lords, PA-C    Diet: Cardiac diet and Diabetic diet Activity:WBAT Follow-up:in 2 weeks Disposition - Home Discharged Condition: good   Discharge Orders   Future Orders Complete By Expires   Call MD / Call 911  As directed    Comments:     If you experience chest pain or shortness of breath, CALL 911 and be transported to the hospital emergency room.  If you develope a fever above 101 F, pus (white drainage) or increased drainage or redness at the wound, or calf pain, call your surgeon's office.   Change dressing  As directed    Comments:     You may change your dressing dressing daily with sterile 4 x 4 inch gauze dressing and paper tape.  Do not submerge the incision under water.   Constipation Prevention  As directed    Comments:     Drink plenty of fluids.  Prune juice may be helpful.  You may use a stool softener, such as Colace (over the counter) 100 mg twice a day.  Use MiraLax (over the counter) for constipation as needed.   Diet - low sodium heart healthy  As directed    Diet Carb Modified  As directed    Discharge instructions  As directed    Comments:     Pick up stool softner and laxative for home. Do not submerge incision under water. May  shower. Continue to use ice for pain and swelling from surgery.  Total Hip Protocol.  Take Xarelto for two and a half more weeks, then discontinue Xarelto. Once the patient has completed the blood thinner regimen, then take a Baby 81 mg Aspirin daily for four more weeks.   Do not sit on low chairs, stoools or toilet seats, as it may be difficult to get up from low surfaces  As directed    Driving restrictions  As directed    Comments:     No driving until released by the physician.   Increase activity slowly as tolerated  As directed  Lifting restrictions  As directed    Comments:     No lifting until released by the physician.   Patient may shower  As directed    Comments:     You may shower without a dressing once there is no drainage.  Do not wash over the wound.  If drainage remains, do not shower until drainage stops.   TED hose  As directed    Comments:     Use stockings (TED hose) for 3 weeks on both leg(s).  You may remove them at night for sleeping.   Weight bearing as tolerated  As directed        Medication List    STOP taking these medications       amLODipine-valsartan 10-320 MG per tablet  Commonly known as:  EXFORGE     atenolol-chlorthalidone 50-25 MG per tablet  Commonly known as:  TENORETIC     estrogens (conjugated) 0.625 MG tablet  Commonly known as:  PREMARIN     insulin detemir 100 UNIT/ML injection  Commonly known as:  LEVEMIR     lisinopril 40 MG tablet  Commonly known as:  PRINIVIL,ZESTRIL     oxymorphone 15 MG 12 hr tablet  Commonly known as:  OPANA ER      TAKE these medications       ALPRAZolam 0.25 MG tablet  Commonly known as:  XANAX  Take 0.25 mg by mouth 3 (three) times daily. As needed     fenofibrate 160 MG tablet  Take 160 mg by mouth every morning.     gabapentin 600 MG tablet  Commonly known as:  NEURONTIN  Take 900-1,200 mg by mouth 3 (three) times daily. TAKES 1 AND 1/2 TABLETS  2 TIMES DAILY AND 2 TABLETS AT BEDTIME      glimepiride 4 MG tablet  Commonly known as:  AMARYL  Take 4 mg by mouth 2 (two) times daily.     insulin aspart 100 UNIT/ML injection  Commonly known as:  novoLOG  Inject 12 Units into the skin 3 (three) times daily with meals.     levothyroxine 50 MCG tablet  Commonly known as:  SYNTHROID, LEVOTHROID  Take 50 mcg by mouth daily before breakfast.     lubiprostone 24 MCG capsule  Commonly known as:  AMITIZA  Take 24 mcg by mouth 2 (two) times daily with a meal. As needed     PROAIR HFA 108 (90 BASE) MCG/ACT inhaler  Generic drug:  albuterol  Inhale 2 puffs into the lungs every 6 (six) hours as needed for wheezing.     rivaroxaban 10 MG Tabs tablet  Commonly known as:  XARELTO  - Take 1 tablet (10 mg total) by mouth daily with breakfast. Take Xarelto for two and a half more weeks, then discontinue Xarelto.  - Once the patient has completed the blood thinner regimen, then take a Baby 81 mg Aspirin daily for four more weeks.     rosuvastatin 20 MG tablet  Commonly known as:  CRESTOR  Take 20 mg by mouth every evening.     sertraline 100 MG tablet  Commonly known as:  ZOLOFT  Take 100 mg by mouth every evening.     tiZANidine 4 MG tablet  Commonly known as:  ZANAFLEX  Take 4-12 mg by mouth at bedtime. 1-3 TABLETS 1 HOUR PRIOR TO BEDTIME           Follow-up Information   Follow up with [name], MD. Schedule  an appointment as soon as possible for a visit in 2 weeks.   Specialty:  Orthopedic Surgery   Contact information:   [address] [place] [address] [phone]       Signed: Mickel Crow 08/27/2013, 10:32 AM

## 2014-11-04 IMAGING — CR DG PORTABLE PELVIS
1 series · 1 of 1 positions shown · non-contrast
Comparison: 08/06/2013.

CLINICAL DATA: Postoperative evaluation.

PORTABLE PELVIS

[AP]
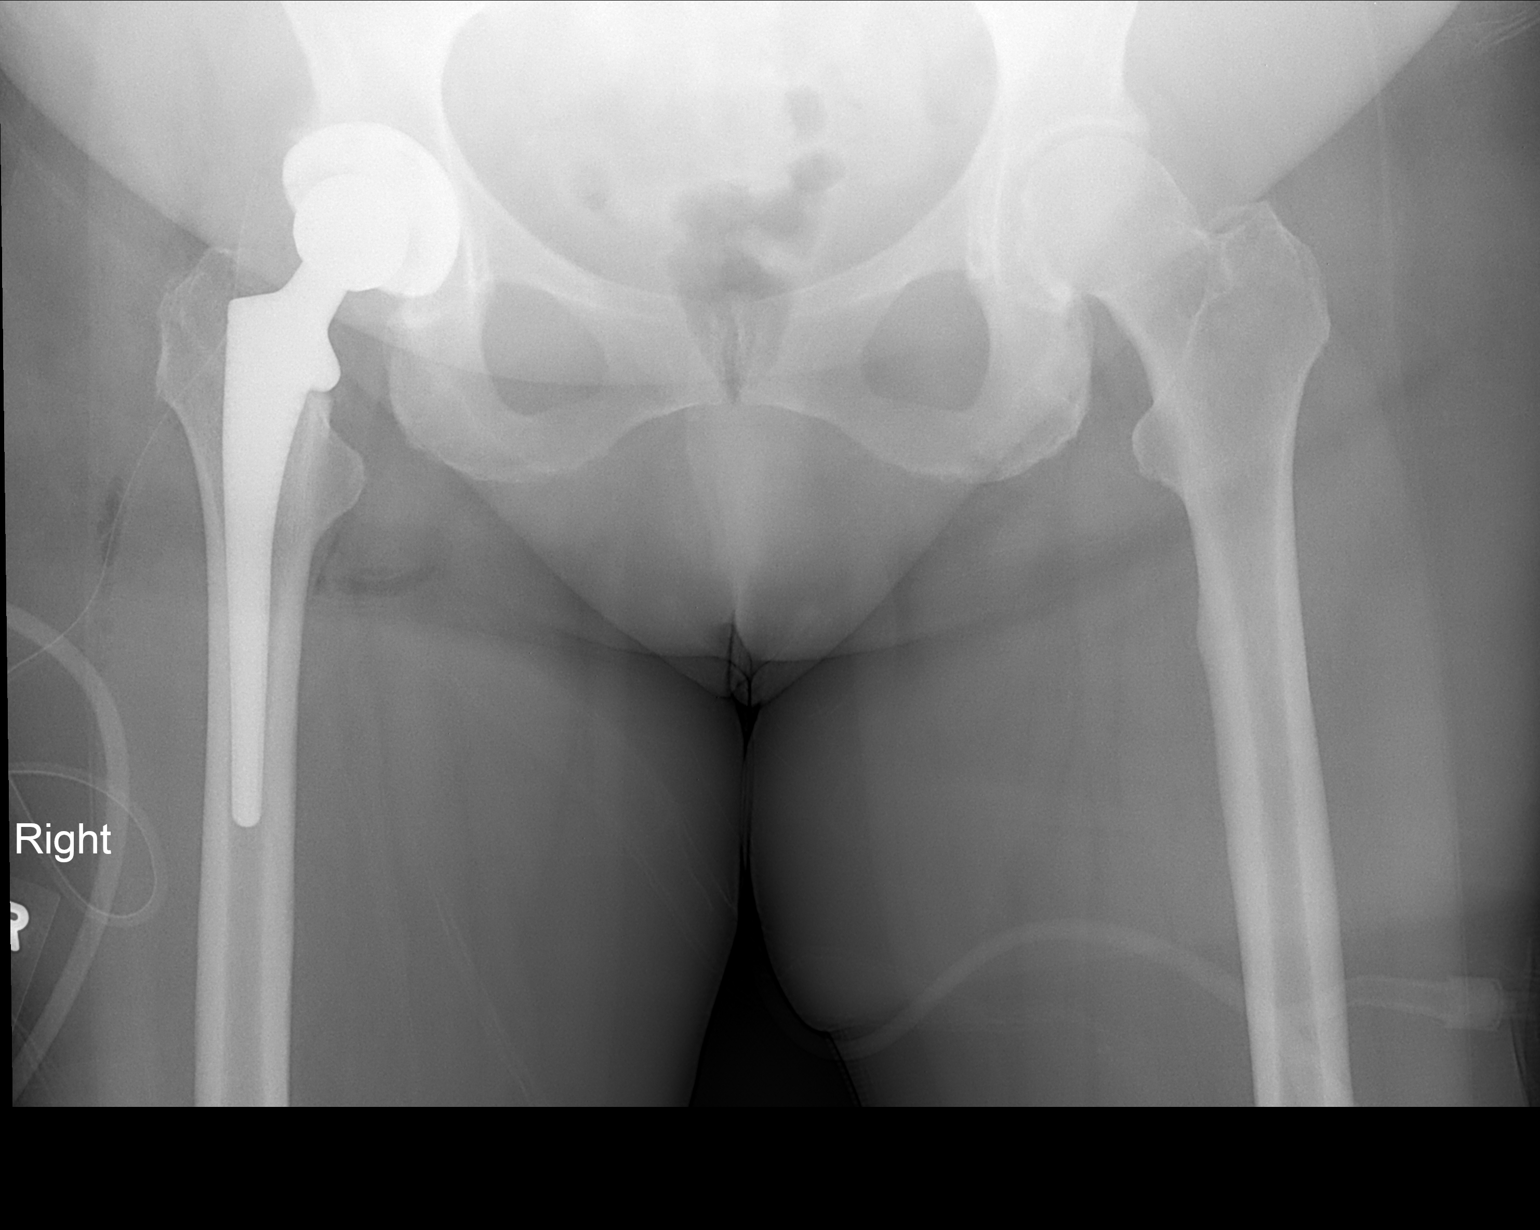

[1 of 1 positions shown; findings below may reference images not displayed]

FINDINGS: Compared to the prior study there has been interval right
total hip arthroplasty.  The femoral and acetabular components of
the prosthesis appear well seated without definite periprosthetic
fracture or other immediate acute complicating features.  Some gas
is present within the joint space in the overlying soft tissues,
and there is a surgical drain in place extending into the joint
space.  Visualized portions of the bony pelvis and left proximal
femur are unremarkable.
IMPRESSION: Expected postoperative appearance of the right hip status post
total hip arthroplasty, as above.

## 2014-11-07 IMAGING — CR DG CHEST 2V
2 series · 2 of 2 positions shown · non-contrast
Comparison: None.

CLINICAL DATA: Fever after hip surgery.

CHEST - 2 VIEW

[w chest lat]
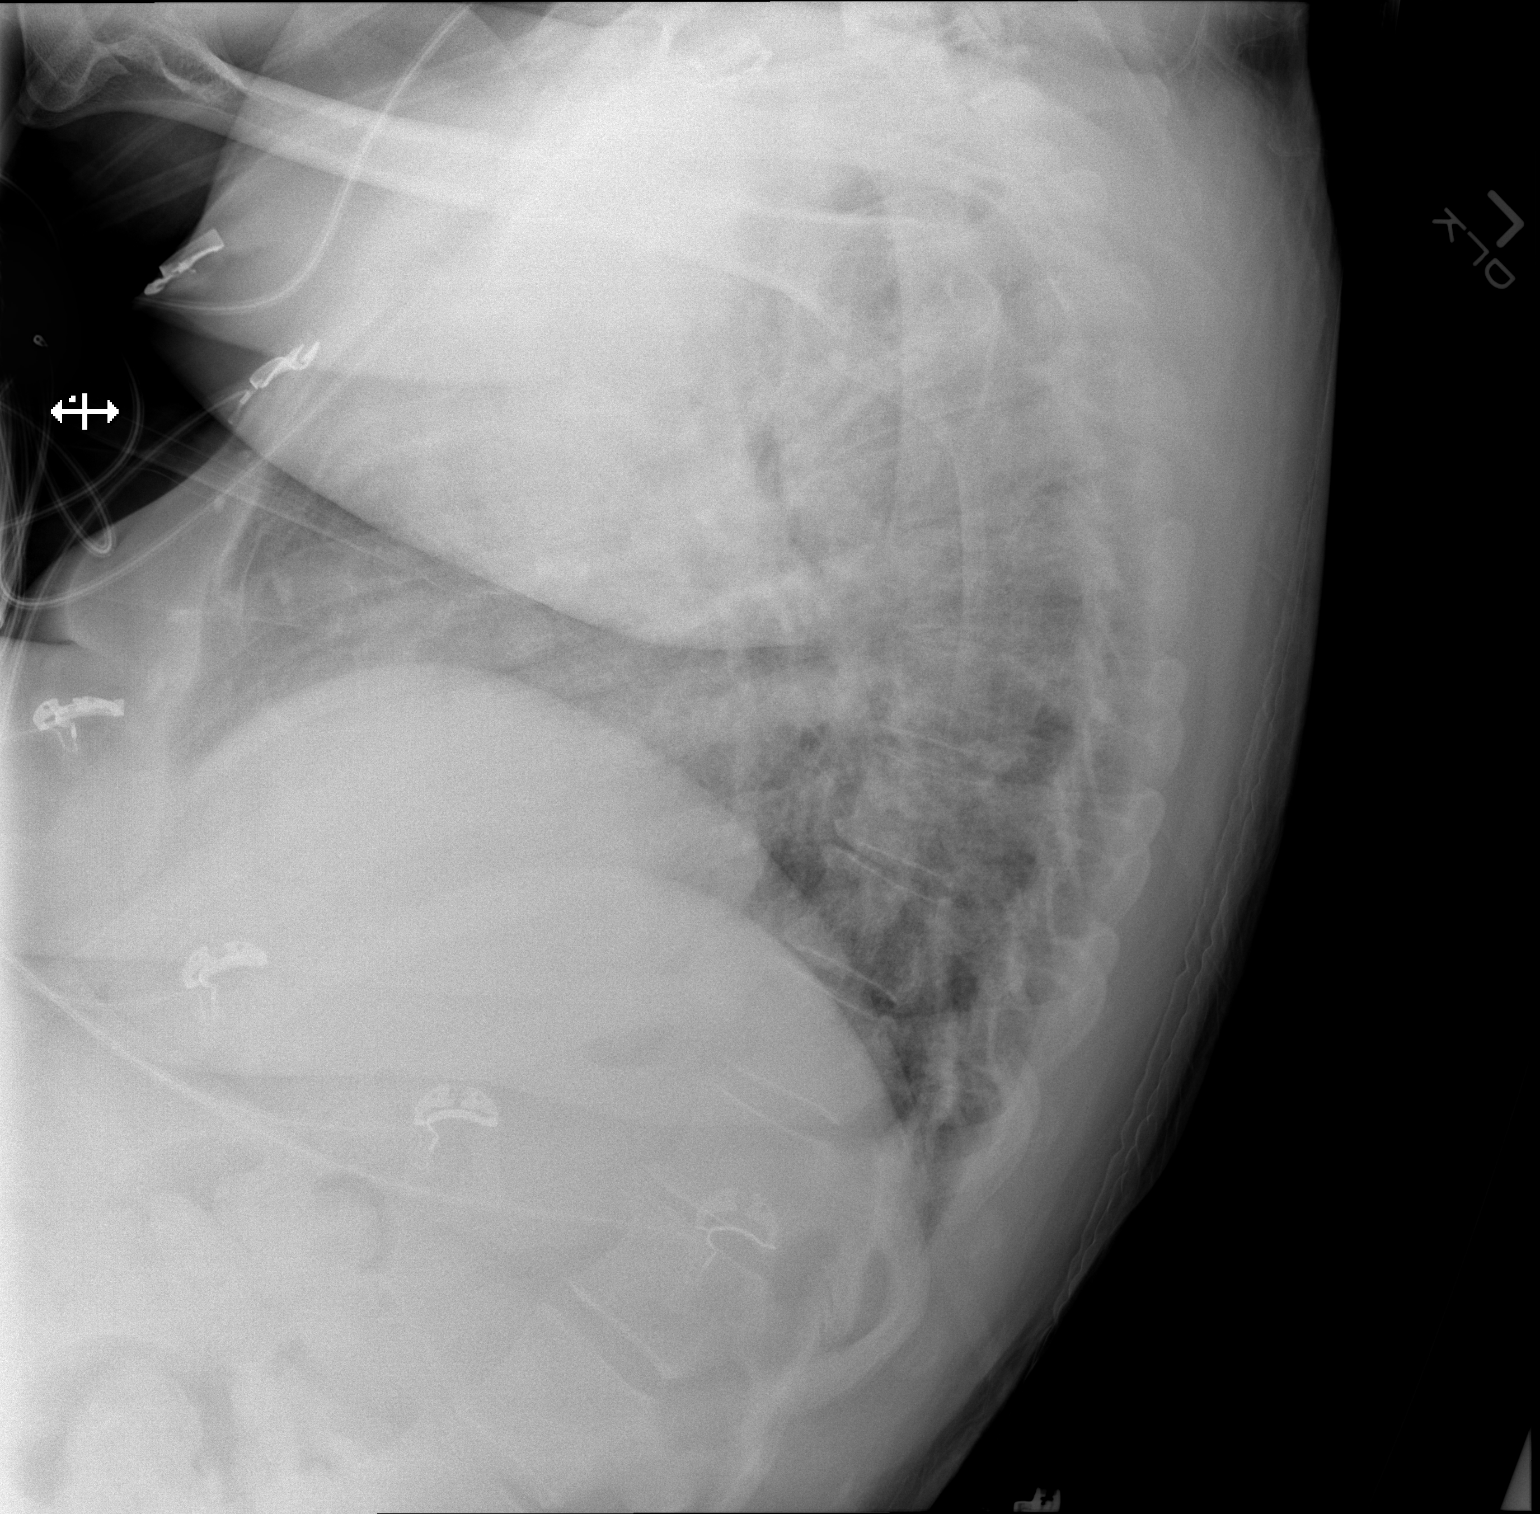

[x chest ap]
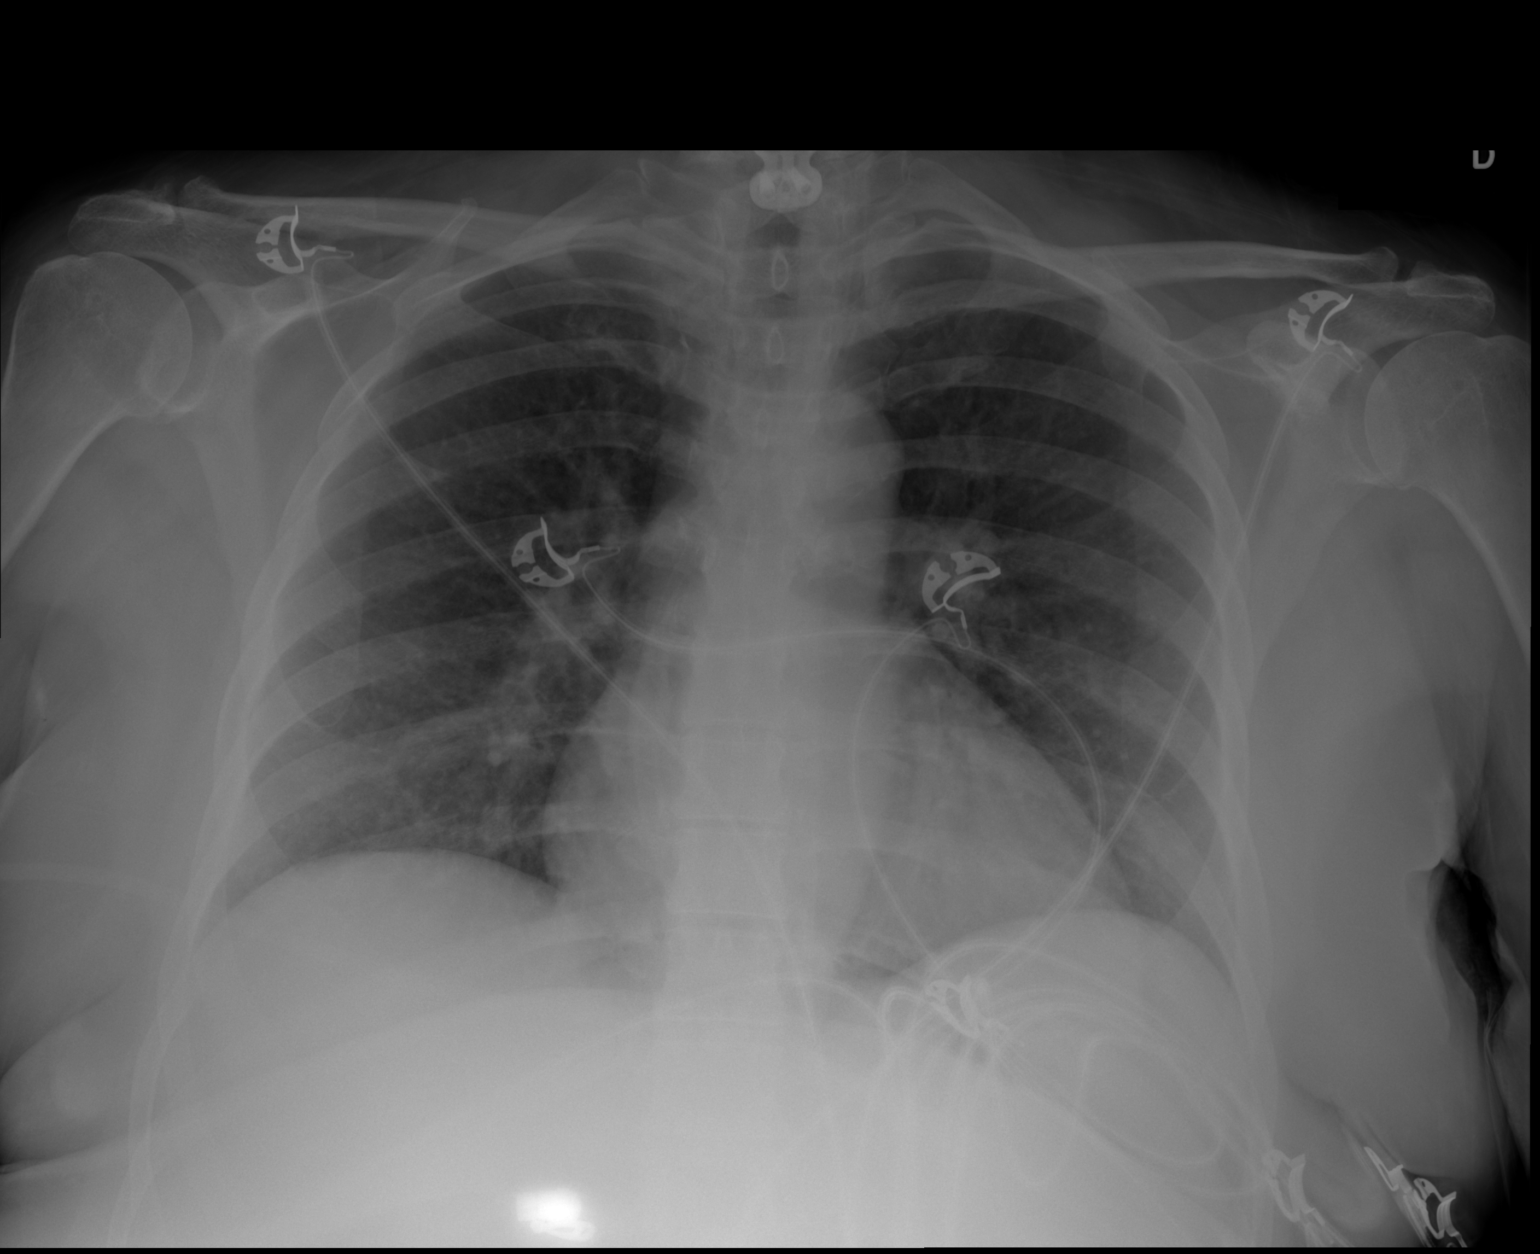

[2 of 2 positions shown; findings below may reference images not displayed]

FINDINGS: Low volume lungs, especially in the lateral projection.
No focal infiltrate, edema, pleural effusion, or pneumothorax.

Normal heart size and mediastinal contours.

Extensive cervical and upper thoracic ACDF with ventral plate screw
fixation.
IMPRESSION: Negative for pneumonia or other acute abnormality.

## 2014-11-08 IMAGING — CR DG CHEST 1V PORT
1 series · 1 of 1 positions shown · non-contrast
Comparison: 08/15/2013

CLINICAL DATA: 67-year-old female with fever and shortness of
breath.

PORTABLE CHEST - 1 VIEW

[AP]
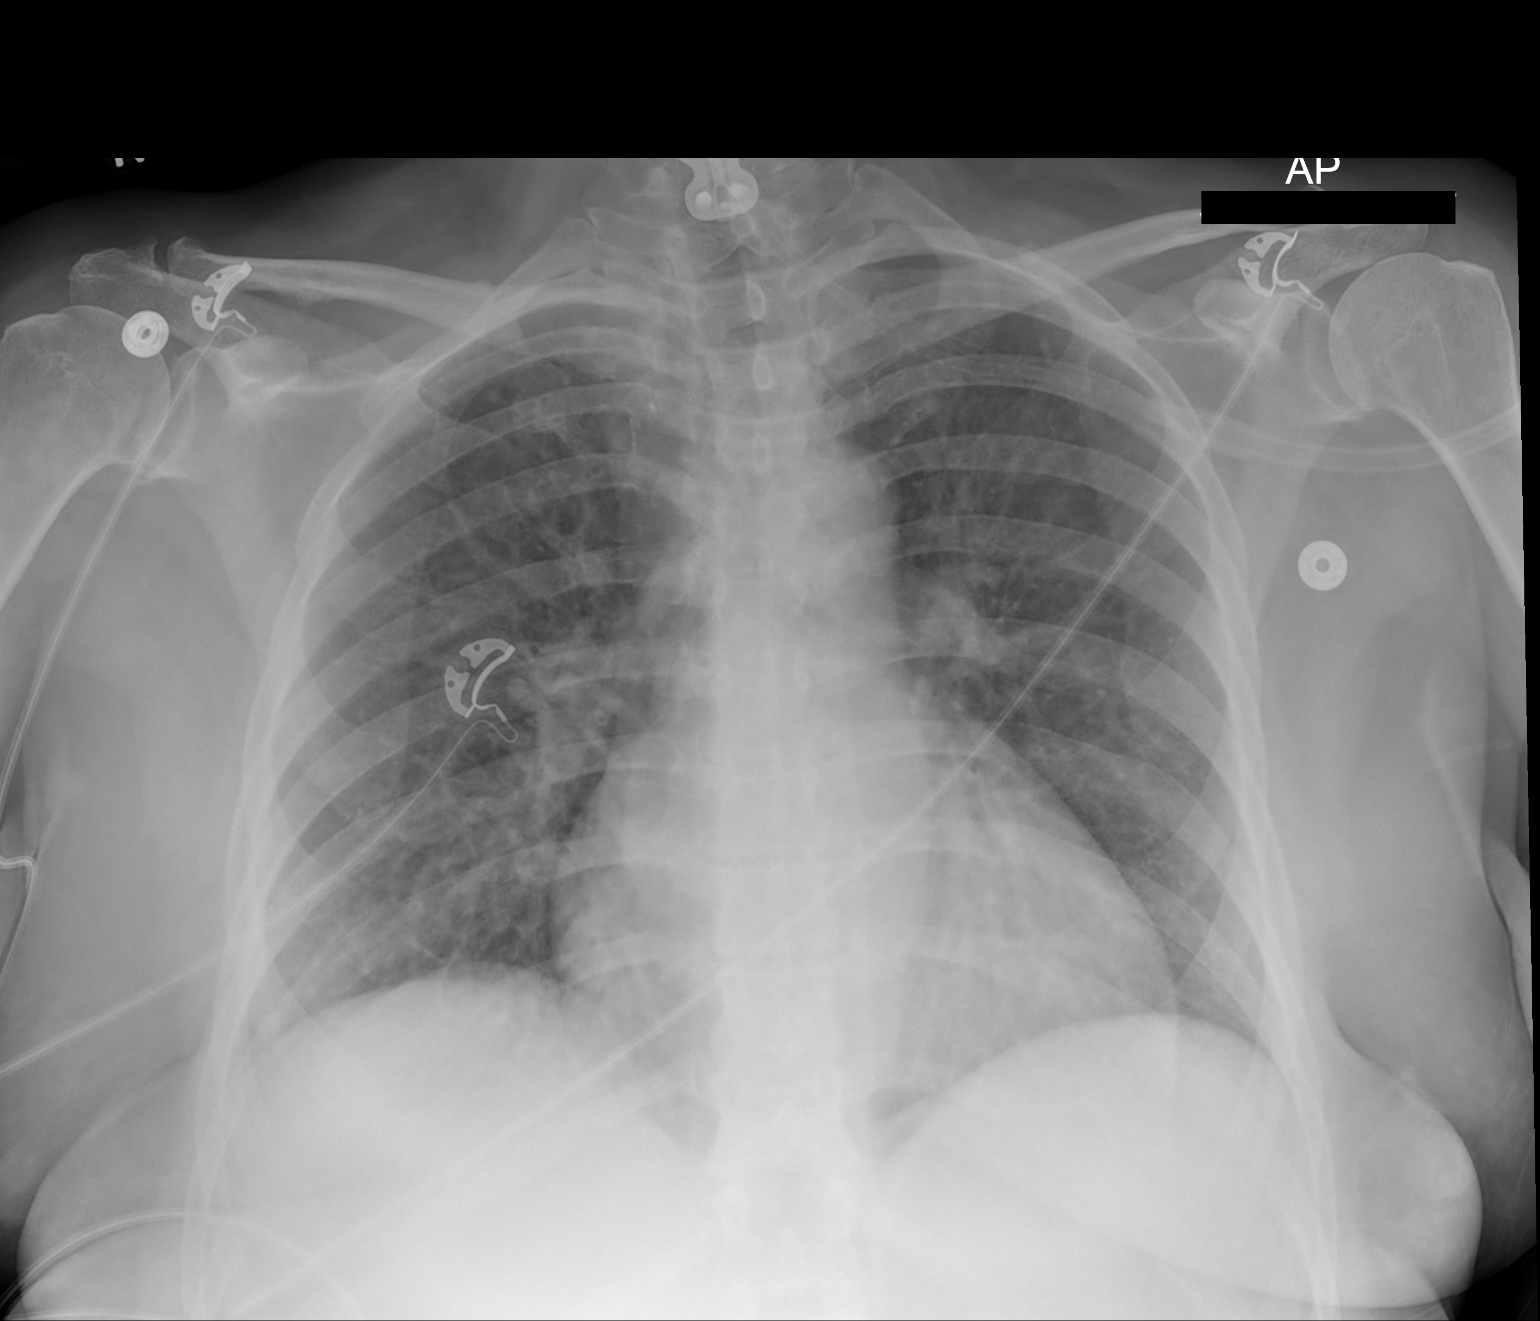

[1 of 1 positions shown; findings below may reference images not displayed]

FINDINGS: The cardiomediastinal silhouette is unremarkable.
Mild peribronchial thickening is again identified.
There is no evidence of focal airspace disease, pulmonary edema,
suspicious pulmonary nodule/mass, pleural effusion, or
pneumothorax.
No acute bony abnormalities are identified.
The cervical surgical hardware again noted.
IMPRESSION: No evidence of acute cardiopulmonary disease.

## 2015-08-31 DIAGNOSIS — E782 Mixed hyperlipidemia: Secondary | ICD-10-CM | POA: Insufficient documentation

## 2015-08-31 DIAGNOSIS — I482 Chronic atrial fibrillation, unspecified: Secondary | ICD-10-CM | POA: Insufficient documentation

## 2016-01-31 DIAGNOSIS — D509 Iron deficiency anemia, unspecified: Secondary | ICD-10-CM | POA: Diagnosis not present

## 2016-03-14 DIAGNOSIS — R0989 Other specified symptoms and signs involving the circulatory and respiratory systems: Secondary | ICD-10-CM | POA: Insufficient documentation

## 2016-10-19 DIAGNOSIS — N189 Chronic kidney disease, unspecified: Secondary | ICD-10-CM | POA: Diagnosis not present

## 2016-10-19 DIAGNOSIS — E119 Type 2 diabetes mellitus without complications: Secondary | ICD-10-CM

## 2016-10-19 DIAGNOSIS — I10 Essential (primary) hypertension: Secondary | ICD-10-CM | POA: Diagnosis not present

## 2016-10-19 DIAGNOSIS — D509 Iron deficiency anemia, unspecified: Secondary | ICD-10-CM

## 2016-10-19 DIAGNOSIS — D631 Anemia in chronic kidney disease: Secondary | ICD-10-CM | POA: Diagnosis not present

## 2017-01-18 DIAGNOSIS — D509 Iron deficiency anemia, unspecified: Secondary | ICD-10-CM | POA: Diagnosis not present

## 2017-01-18 DIAGNOSIS — N189 Chronic kidney disease, unspecified: Secondary | ICD-10-CM | POA: Diagnosis not present

## 2017-01-18 DIAGNOSIS — D631 Anemia in chronic kidney disease: Secondary | ICD-10-CM | POA: Diagnosis not present

## 2017-04-12 DIAGNOSIS — D631 Anemia in chronic kidney disease: Secondary | ICD-10-CM | POA: Diagnosis not present

## 2017-04-12 DIAGNOSIS — E611 Iron deficiency: Secondary | ICD-10-CM | POA: Diagnosis not present

## 2017-04-12 DIAGNOSIS — N189 Chronic kidney disease, unspecified: Secondary | ICD-10-CM | POA: Diagnosis not present

## 2017-07-12 DIAGNOSIS — D631 Anemia in chronic kidney disease: Secondary | ICD-10-CM | POA: Diagnosis not present

## 2017-07-12 DIAGNOSIS — N189 Chronic kidney disease, unspecified: Secondary | ICD-10-CM | POA: Diagnosis not present

## 2017-07-12 DIAGNOSIS — I10 Essential (primary) hypertension: Secondary | ICD-10-CM | POA: Diagnosis not present

## 2017-07-12 DIAGNOSIS — D509 Iron deficiency anemia, unspecified: Secondary | ICD-10-CM | POA: Diagnosis not present

## 2017-09-18 ENCOUNTER — Ambulatory Visit (INDEPENDENT_AMBULATORY_CARE_PROVIDER_SITE_OTHER): Payer: BLUE CROSS/BLUE SHIELD | Admitting: Cardiology

## 2017-09-18 ENCOUNTER — Encounter: Payer: Self-pay | Admitting: Cardiology

## 2017-09-18 VITALS — BP 120/70 | HR 64 | Resp 10 | Ht 62.0 in | Wt 160.0 lb

## 2017-09-18 DIAGNOSIS — R0989 Other specified symptoms and signs involving the circulatory and respiratory systems: Secondary | ICD-10-CM

## 2017-09-18 DIAGNOSIS — I482 Chronic atrial fibrillation, unspecified: Secondary | ICD-10-CM

## 2017-09-18 DIAGNOSIS — I10 Essential (primary) hypertension: Secondary | ICD-10-CM | POA: Diagnosis not present

## 2017-09-18 DIAGNOSIS — Z794 Long term (current) use of insulin: Secondary | ICD-10-CM | POA: Diagnosis not present

## 2017-09-18 DIAGNOSIS — E1129 Type 2 diabetes mellitus with other diabetic kidney complication: Secondary | ICD-10-CM | POA: Diagnosis not present

## 2017-09-18 DIAGNOSIS — R809 Proteinuria, unspecified: Secondary | ICD-10-CM | POA: Diagnosis not present

## 2017-09-18 NOTE — Patient Instructions (Signed)
Medication Instructions:  Your physician recommends that you continue on your current medications as directed. Please refer to the Current Medication list given to you today.  Labwork: None ordered  Testing/Procedures: Your physician has requested that you have a carotid duplex. This test is an ultrasound of the carotid arteries in your neck. It looks at blood flow through these arteries that supply the brain with blood. Allow one hour for this exam. There are no restrictions or special instructions. (You will be put on recall list and receive a letter through the mail to call our office to set up an appointment to have this done in [place] when we start testing in the office.)  EKG  Follow-Up: Your physician recommends that you schedule a follow-up appointment in: 6 months with Dr. Agustin Cree   Any Other Special Instructions Will Be Listed Below (If Applicable).  Please note that any paperwork needing to be filled out by the provider will need to be addressed at the front desk prior to seeing the provider. Please note that any paperwork FMLA, Disability or other documents regarding health condition is subject to a $25.00 charge that must be received prior to completion of paperwork in the form of a money order or check.     If you need a refill on your cardiac medications before your next appointment, please call your pharmacy.

## 2017-09-18 NOTE — Progress Notes (Signed)
Cardiology Office Note:    Date:  09/18/2017   ID:  Robin Brewer, DOB [birth year], MRN [id number]  PCP:  Nicoletta Dress, MD  Cardiologist:  Jenne Campus, MD    Referring MD: Nicoletta Dress, MD   Chief Complaint  Patient presents with  . Follow-up  She is doing fair  History of Present Illness:    Robin Brewer is a 71 y.o. female  with multiple medical problems. Last cardiac evaluation that she had done was in February which included stress test that was negative as well as echocardiogram which showed preserved ejection fraction. After that she had gallbladder surgery that was uncomplicated. She still complaining of having some kind of GI issue but no chest pain tightness squeezing pressure burning in her chest. Overall she seems to be doing well. Still complaining of having difficulty walking because of back pain.  Past Medical History:  Diagnosis Date  . Anxiety   . Anxiety state, unspecified 08/17/2013  . Arthritis   . Atrial fibrillation ([place])   . Bronchitis    hx of   . Chronic pain syndrome 08/17/2013  . Depression   . Diabetes ([place]) 08/17/2013  . Diabetes mellitus without complication ([place])   . HTN (hypertension) 08/17/2013  . Hyperlipidemia   . Hypertension   . Hypothyroidism   . Right carotid bruit   . S/P total hip arthroplasty 08/17/2013   Right. 08/12/13  . Shortness of breath    with exertion   . Unspecified hypothyroidism 08/17/2013    Past Surgical History:  Procedure Laterality Date  . ABDOMINAL HYSTERECTOMY    . BACK SURGERY    . NECK SURGERY    . TOTAL HIP ARTHROPLASTY Right 08/12/2013   Procedure: RIGHT TOTAL HIP ARTHROPLASTY ANTERIOR APPROACH;  Surgeon: [name], MD;  Location: [facility];  Service: Orthopedics;  Laterality: Right;    Current Medications: No outpatient prescriptions have been marked as taking for the 09/18/17 encounter (Office Visit) with Park Liter, MD.     Allergies:   Aspirin and Niacin and related   Social  History   Social History  . Marital status: Married    Spouse name: N/A  . Number of children: N/A  . Years of education: N/A   Social History Main Topics  . Smoking status: Former Smoker    Packs/day: 0.25    Types: Cigarettes  . Smokeless tobacco: Never Used  . Alcohol use No  . Drug use: No  . Sexual activity: Not Currently   Other Topics Concern  . None   Social History Narrative  . None     Family History: The patient's family history includes Heart disease in her mother; Transient ischemic attack in her mother. ROS:   Please see the history of present illness.    All 14 point review of systems negative except as described per history of present illness  EKGs/Labs/Other Studies Reviewed:    EKG done today showed normal sinus rhythm rate 63, left ventricular hypertrophy with repolarization of the mouth is: Poor R-wave progression anterior precordium.  Recent Labs: No results found for requested labs within last 8760 hours.  Recent Lipid Panel No results found for: CHOL, TRIG, HDL, CHOLHDL, VLDL, LDLCALC, LDLDIRECT  Physical Exam:    VS:  BP 120/70   Pulse 64   Resp 10   Ht $R'5\' 2"'Ve$  (1.575 m)   Wt 160 lb (72.6 kg)   BMI 29.26 kg/m     Wt Readings  from Last 3 Encounters:  09/18/17 160 lb (72.6 kg)  08/16/13 175 lb 7.8 oz (79.6 kg)  08/12/13 170 lb (77.1 kg)     GEN:  Well nourished, well developed in no acute distress HEENT: Normal NECK: No JVD; No carotid bruits LYMPHATICS: No lymphadenopathy CARDIAC: RRR, no murmurs, no rubs, no gallops RESPIRATORY:  Clear to auscultation without rales, wheezing or rhonchi  ABDOMEN: Soft, non-tender, non-distended MUSCULOSKELETAL:  No edema; No deformity  SKIN: Warm and dry LOWER EXTREMITIES: no swelling NEUROLOGIC:  Alert and oriented x 3 PSYCHIATRIC:  Normal affect   ASSESSMENT:    1. Atrial fibrillation, chronic ([place])   2. Essential hypertension   3. Right carotid bruit   4. Type 2 diabetes mellitus with  microalbuminuria, with long-term current use of insulin (HCC)    PLAN:    In order of problems listed above:  1. Atrial fibrillationParoxysmal: Anticoagulated which I will continue. 2. Essential hypertension: Blood pressure will control continue present management. 3. Right carotid bruit: I cannot hear it today. I was wanted her to have carotid ultrasounds however she prefers to wait until was started doing it in [place] office 4. 2 diabetes: Followed by her primary care physician at her mobility and once he is very good. 5. Dyslipidemia: Last fasting lipid profile is excellent   Medication Adjustments/Labs and Tests Ordered: Current medicines are reviewed at length with the patient today.  Concerns regarding medicines are outlined above.  No orders of the defined types were placed in this encounter.  Medication changes: No orders of the defined types were placed in this encounter.   Signed, Park Liter, MD, [facility] 09/18/2017 3:46 PM    [place]

## 2017-09-20 NOTE — Addendum Note (Signed)
Addended by: Aleatha Borer on: 09/20/2017 02:51 PM   Modules accepted: Orders

## 2017-09-24 DIAGNOSIS — D631 Anemia in chronic kidney disease: Secondary | ICD-10-CM | POA: Diagnosis not present

## 2017-09-24 DIAGNOSIS — I10 Essential (primary) hypertension: Secondary | ICD-10-CM | POA: Diagnosis not present

## 2017-09-24 DIAGNOSIS — N189 Chronic kidney disease, unspecified: Secondary | ICD-10-CM | POA: Diagnosis not present

## 2017-09-24 DIAGNOSIS — E611 Iron deficiency: Secondary | ICD-10-CM | POA: Diagnosis not present

## 2017-10-07 ENCOUNTER — Other Ambulatory Visit: Payer: Self-pay

## 2017-10-07 MED ORDER — HYDRALAZINE HCL 50 MG PO TABS: 50.0000 mg | ORAL_TABLET | Freq: Three times a day (TID) | ORAL | 6 refills | Status: DC

## 2017-12-26 DIAGNOSIS — E611 Iron deficiency: Secondary | ICD-10-CM | POA: Diagnosis not present

## 2017-12-26 DIAGNOSIS — D631 Anemia in chronic kidney disease: Secondary | ICD-10-CM | POA: Diagnosis not present

## 2017-12-26 DIAGNOSIS — N189 Chronic kidney disease, unspecified: Secondary | ICD-10-CM | POA: Diagnosis not present

## 2018-03-28 DIAGNOSIS — E611 Iron deficiency: Secondary | ICD-10-CM | POA: Diagnosis not present

## 2018-03-28 DIAGNOSIS — N189 Chronic kidney disease, unspecified: Secondary | ICD-10-CM | POA: Diagnosis not present

## 2018-03-28 DIAGNOSIS — D631 Anemia in chronic kidney disease: Secondary | ICD-10-CM | POA: Diagnosis not present

## 2018-06-20 ENCOUNTER — Ambulatory Visit (INDEPENDENT_AMBULATORY_CARE_PROVIDER_SITE_OTHER): Payer: BLUE CROSS/BLUE SHIELD | Admitting: Cardiology

## 2018-06-20 ENCOUNTER — Encounter: Payer: Self-pay | Admitting: Cardiology

## 2018-06-20 VITALS — BP 124/76 | HR 69 | Ht 62.0 in | Wt 174.0 lb

## 2018-06-20 DIAGNOSIS — Z794 Long term (current) use of insulin: Secondary | ICD-10-CM

## 2018-06-20 DIAGNOSIS — E782 Mixed hyperlipidemia: Secondary | ICD-10-CM

## 2018-06-20 DIAGNOSIS — R809 Proteinuria, unspecified: Secondary | ICD-10-CM | POA: Diagnosis not present

## 2018-06-20 DIAGNOSIS — R0989 Other specified symptoms and signs involving the circulatory and respiratory systems: Secondary | ICD-10-CM | POA: Diagnosis not present

## 2018-06-20 DIAGNOSIS — I482 Chronic atrial fibrillation, unspecified: Secondary | ICD-10-CM

## 2018-06-20 DIAGNOSIS — R609 Edema, unspecified: Secondary | ICD-10-CM | POA: Diagnosis not present

## 2018-06-20 DIAGNOSIS — E1129 Type 2 diabetes mellitus with other diabetic kidney complication: Secondary | ICD-10-CM | POA: Diagnosis not present

## 2018-06-20 NOTE — Patient Instructions (Signed)
Medication Instructions:  Your physician recommends that you continue on your current medications as directed. Please refer to the Current Medication list given to you today.  Labwork: Your physician recommends that you have the following labs drawn: ProBNP  Testing/Procedures: Your physician has requested that you have a carotid duplex. This test is an ultrasound of the carotid arteries in your neck. It looks at blood flow through these arteries that supply the brain with blood. Allow one hour for this exam. There are no restrictions or special instructions.  Follow-Up: Your physician recommends that you schedule a follow-up appointment in: 5 months  Any Other Special Instructions Will Be Listed Below (If Applicable).     If you need a refill on your cardiac medications before your next appointment, please call your pharmacy.   [place], RN, BSN

## 2018-06-20 NOTE — Progress Notes (Signed)
Cardiology Office Note:    Date:  06/20/2018   ID:  Robin Brewer, DOB [birth year], MRN [id number]  PCP:  Robin Dress, MD  Cardiologist:  Robin Campus, MD    Referring MD: Robin Dress, MD   No chief complaint on file. Doing well  History of Present Illness:    Robin Brewer is a 72 y.o. female with chronic atrial fibrillation, essential hypertension diabetes overall cardiac wise doing well denies have any chest pain tightness squeezing pressure burning chest she does have some swelling of lower extremities.  She did have a stress test and echocardiogram done about a year ago those were fine I will ask her to have proBNP to check and see if her swelling is related to her heart.  It is also time to repeat her carotid ultrasounds which I will schedule her to have I see her back in my office in about 5 months  Past Medical History:  Diagnosis Date  . Anxiety   . Anxiety state, unspecified 08/17/2013  . Arthritis   . Atrial fibrillation ([place])   . Bronchitis    hx of   . Chronic pain syndrome 08/17/2013  . Depression   . Diabetes ([place]) 08/17/2013  . Diabetes mellitus without complication ([place])   . HTN (hypertension) 08/17/2013  . Hyperlipidemia   . Hypertension   . Hypothyroidism   . Right carotid bruit   . S/P total hip arthroplasty 08/17/2013   Right. 08/12/13  . Shortness of breath    with exertion   . Unspecified hypothyroidism 08/17/2013    Past Surgical History:  Procedure Laterality Date  . ABDOMINAL HYSTERECTOMY    . BACK SURGERY    . NECK SURGERY    . TOTAL HIP ARTHROPLASTY Right 08/12/2013   Procedure: RIGHT TOTAL HIP ARTHROPLASTY ANTERIOR APPROACH;  Surgeon: Robin Alf, MD;  Location: [facility];  Service: Orthopedics;  Laterality: Right;    Current Medications: Current Meds  Medication Sig  . albuterol (PROAIR HFA) 108 (90 BASE) MCG/ACT inhaler Inhale 2 puffs into the lungs every 6 (six) hours as needed for wheezing.  Marland Kitchen amLODipine (NORVASC) 5 MG  tablet Take 2 tablets by mouth daily.  Marland Kitchen ELIQUIS 5 MG TABS tablet Take 1 tablet by mouth 2 (two) times daily.  . furosemide (LASIX) 40 MG tablet Take 1 tablet by mouth 2 (two) times daily.   . hydrALAZINE (APRESOLINE) 50 MG tablet Take 1 tablet (50 mg total) by mouth 3 (three) times daily. (Patient taking differently: Take 25 mg by mouth 2 (two) times daily. )  . HYDROcodone-acetaminophen (NORCO) 10-325 MG tablet Take 1 tablet by mouth 2 (two) times daily.  Robin Brewer Ethyl (VASCEPA) 1 g CAPS Take 1 capsule by mouth 2 (two) times daily.  . insulin aspart (NOVOLOG) 100 UNIT/ML injection Inject 12 Units into the skin 3 (three) times daily with meals.   . insulin detemir (LEVEMIR) 100 UNIT/ML injection Inject 0.3 mLs (30 Units total) into the skin daily. Patient takes in the am (Patient taking differently: Inject 60 Units into the skin daily. Patient takes in the am)  . labetalol (NORMODYNE) 300 MG tablet Take 1 tablet by mouth 2 (two) times daily.  Marland Kitchen levothyroxine (SYNTHROID, LEVOTHROID) 50 MCG tablet Take 50 mcg by mouth daily before breakfast.  . omeprazole (PRILOSEC) 20 MG capsule Take 1 capsule by mouth daily.  . ranolazine (RANEXA) 500 MG 12 hr tablet Take 1 tablet by mouth 2 (two) times daily.  Marland Kitchen  rosuvastatin (CRESTOR) 40 MG tablet Take 40 mg by mouth every evening.   . sertraline (ZOLOFT) 100 MG tablet Take 100 mg by mouth every evening.  Marland Kitchen tiZANidine (ZANAFLEX) 4 MG tablet Take 4-12 mg by mouth at bedtime. 1-3 TABLETS 1 HOUR PRIOR TO BEDTIME     Allergies:   Aspirin and Niacin and related   Social History   Socioeconomic History  . Marital status: Married    Spouse name: Not on file  . Number of children: Not on file  . Years of education: Not on file  . Highest education level: Not on file  Occupational History  . Not on file  Social Needs  . Financial resource strain: Not on file  . Food insecurity:    Worry: Not on file    Inability: Not on file  . Transportation needs:     Medical: Not on file    Non-medical: Not on file  Tobacco Use  . Smoking status: Former Smoker    Packs/day: 0.25    Types: Cigarettes  . Smokeless tobacco: Never Used  Substance and Sexual Activity  . Alcohol use: No  . Drug use: No  . Sexual activity: Not Currently  Lifestyle  . Physical activity:    Days per week: Not on file    Minutes per session: Not on file  . Stress: Not on file  Relationships  . Social connections:    Talks on phone: Not on file    Gets together: Not on file    Attends religious service: Not on file    Active member of club or organization: Not on file    Attends meetings of clubs or organizations: Not on file    Relationship status: Not on file  Other Topics Concern  . Not on file  Social History Narrative  . Not on file     Family History: The patient's family history includes Heart disease in her mother; Transient ischemic attack in her mother. ROS:   Please see the history of present illness.    All 14 point review of systems negative except as described per history of present illness  EKGs/Labs/Other Studies Reviewed:      Recent Labs: No results found for requested labs within last 8760 hours.  Recent Lipid Panel No results found for: CHOL, TRIG, HDL, CHOLHDL, VLDL, LDLCALC, LDLDIRECT  Physical Exam:    VS:  BP 124/76 (BP Location: Right Arm, Patient Position: Sitting, Cuff Size: Normal)   Pulse 69   Ht $R'5\' 2"'nW$  (1.575 m)   Wt 174 lb (78.9 kg)   SpO2 98%   BMI 31.83 kg/m     Wt Readings from Last 3 Encounters:  06/20/18 174 lb (78.9 kg)  09/18/17 160 lb (72.6 kg)  08/16/13 175 lb 7.8 oz (79.6 kg)     GEN:  Well nourished, well developed in no acute distress HEENT: Normal NECK: No JVD; No carotid bruits LYMPHATICS: No lymphadenopathy CARDIAC: RRR, no murmurs, no rubs, no gallops RESPIRATORY:  Clear to auscultation without rales, wheezing or rhonchi  ABDOMEN: Soft, non-tender, non-distended MUSCULOSKELETAL:  No edema; No  deformity  SKIN: Warm and dry LOWER EXTREMITIES: no swelling NEUROLOGIC:  Alert and oriented x 3 PSYCHIATRIC:  Normal affect   ASSESSMENT:    1. Atrial fibrillation, chronic ([place])   2. Type 2 diabetes mellitus with microalbuminuria, with long-term current use of insulin (HCC)   3. Mixed hyperlipidemia    PLAN:    In order of problems  listed above:  1. Atrial fibrillation which is chronic anticoagulated which I will continue. 2. Type 2 diabetes stable on appropriate medications. 3. Mixed dyslipidemia on Crestor 40 which I will continue.   Medication Adjustments/Labs and Tests Ordered: Current medicines are reviewed at length with the patient today.  Concerns regarding medicines are outlined above.  No orders of the defined types were placed in this encounter.  Medication changes: No orders of the defined types were placed in this encounter.   Signed, Park Liter, MD, [facility] 06/20/2018 3:41 PM    [place]

## 2018-06-21 LAB — PRO B NATRIURETIC PEPTIDE: NT-PRO BNP: 486 pg/mL — AB (ref 0–301)

## 2018-06-23 ENCOUNTER — Other Ambulatory Visit: Payer: Self-pay

## 2018-06-23 DIAGNOSIS — I10 Essential (primary) hypertension: Secondary | ICD-10-CM

## 2018-06-23 MED ORDER — FUROSEMIDE 40 MG PO TABS: ORAL_TABLET | ORAL | 2 refills | Status: DC

## 2018-06-23 NOTE — Telephone Encounter (Signed)
Informed patient of her BNP results. Instructed to increase her lasix to 60 mg in the morning and 40 in the evening. BNP and BMP in 1 week.

## 2018-06-27 DIAGNOSIS — Z862 Personal history of diseases of the blood and blood-forming organs and certain disorders involving the immune mechanism: Secondary | ICD-10-CM | POA: Diagnosis not present

## 2018-06-27 DIAGNOSIS — I10 Essential (primary) hypertension: Secondary | ICD-10-CM | POA: Diagnosis not present

## 2018-06-27 DIAGNOSIS — D631 Anemia in chronic kidney disease: Secondary | ICD-10-CM | POA: Diagnosis not present

## 2018-06-27 DIAGNOSIS — N189 Chronic kidney disease, unspecified: Secondary | ICD-10-CM | POA: Diagnosis not present

## 2018-07-03 ENCOUNTER — Other Ambulatory Visit: Payer: Self-pay

## 2018-07-03 ENCOUNTER — Ambulatory Visit (INDEPENDENT_AMBULATORY_CARE_PROVIDER_SITE_OTHER): Payer: BLUE CROSS/BLUE SHIELD

## 2018-07-03 DIAGNOSIS — R0989 Other specified symptoms and signs involving the circulatory and respiratory systems: Secondary | ICD-10-CM | POA: Diagnosis not present

## 2018-07-03 DIAGNOSIS — I10 Essential (primary) hypertension: Secondary | ICD-10-CM

## 2018-07-03 NOTE — Progress Notes (Signed)
Complete carotid duplex performed. Normal exam  [place]

## 2018-07-04 LAB — BASIC METABOLIC PANEL
BUN / CREAT RATIO: 13 (ref 12–28)
BUN: 34 mg/dL — AB (ref 8–27)
CHLORIDE: 104 mmol/L (ref 96–106)
CO2: 21 mmol/L (ref 20–29)
Calcium: 8.3 mg/dL — ABNORMAL LOW (ref 8.7–10.3)
Creatinine, Ser: 2.54 mg/dL — ABNORMAL HIGH (ref 0.57–1.00)
GFR calc non Af Amer: 18 mL/min/{1.73_m2} — ABNORMAL LOW (ref >59)
GFR, EST AFRICAN AMERICAN: 21 mL/min/{1.73_m2} — AB (ref >59)
GLUCOSE: 353 mg/dL — AB (ref 65–99)
POTASSIUM: 4.4 mmol/L (ref 3.5–5.2)
Sodium: 143 mmol/L (ref 134–144)

## 2018-07-04 LAB — PRO B NATRIURETIC PEPTIDE: NT-PRO BNP: 553 pg/mL — AB (ref 0–301)

## 2018-07-08 ENCOUNTER — Telehealth: Payer: Self-pay | Admitting: *Deleted

## 2018-07-08 DIAGNOSIS — I10 Essential (primary) hypertension: Secondary | ICD-10-CM

## 2018-07-08 NOTE — Telephone Encounter (Signed)
-----   Message from Park Liter, MD sent at 07/08/2018  9:26 AM EDT ----- Repeat chem7, creat up, need to recheck it

## 2018-07-08 NOTE — Telephone Encounter (Signed)
Patient informed of lab results and advised to go to the [place] office for repeat lab work as soon as possible. Patient verbalized understanding and states the soonest she can go is Friday. Advised that she does not need an appointment.  Informed her to call our office if she had further concerns. Patient verbalized understanding. No further questions.

## 2018-07-12 LAB — BASIC METABOLIC PANEL
BUN / CREAT RATIO: 12 (ref 12–28)
BUN: 30 mg/dL — ABNORMAL HIGH (ref 8–27)
CHLORIDE: 100 mmol/L (ref 96–106)
CO2: 22 mmol/L (ref 20–29)
CREATININE: 2.52 mg/dL — AB (ref 0.57–1.00)
Calcium: 8.1 mg/dL — ABNORMAL LOW (ref 8.7–10.3)
GFR calc Af Amer: 21 mL/min/{1.73_m2} — ABNORMAL LOW (ref >59)
GFR calc non Af Amer: 19 mL/min/{1.73_m2} — ABNORMAL LOW (ref >59)
GLUCOSE: 393 mg/dL — AB (ref 65–99)
POTASSIUM: 4.2 mmol/L (ref 3.5–5.2)
SODIUM: 138 mmol/L (ref 134–144)

## 2018-07-15 ENCOUNTER — Telehealth: Payer: Self-pay | Admitting: *Deleted

## 2018-07-15 DIAGNOSIS — N179 Acute kidney failure, unspecified: Secondary | ICD-10-CM

## 2018-07-15 DIAGNOSIS — I10 Essential (primary) hypertension: Secondary | ICD-10-CM

## 2018-07-15 MED ORDER — FUROSEMIDE 40 MG PO TABS: ORAL_TABLET | ORAL | 2 refills | Status: DC

## 2018-07-15 NOTE — Telephone Encounter (Signed)
-----   Message from Park Liter, MD sent at 07/14/2018  1:36 PM EDT ----- Reduce lasox to 60 qd  chem7 in 1 week

## 2018-07-15 NOTE — Telephone Encounter (Signed)
Patient informed of lab results and advised to decrease furosemide from 60 mg in the morning and 40 mg in the afternoon to 60 mg daily. Patient will go to the [place] office next Friday, 07/25/18 for repeat BMP. Patient verbalized understanding. No further questions.

## 2018-09-03 DIAGNOSIS — M47816 Spondylosis without myelopathy or radiculopathy, lumbar region: Secondary | ICD-10-CM | POA: Diagnosis not present

## 2018-09-03 DIAGNOSIS — M5136 Other intervertebral disc degeneration, lumbar region: Secondary | ICD-10-CM | POA: Diagnosis not present

## 2018-09-03 DIAGNOSIS — M5416 Radiculopathy, lumbar region: Secondary | ICD-10-CM | POA: Diagnosis not present

## 2018-09-03 DIAGNOSIS — Z96649 Presence of unspecified artificial hip joint: Secondary | ICD-10-CM | POA: Diagnosis not present

## 2018-09-03 DIAGNOSIS — M961 Postlaminectomy syndrome, not elsewhere classified: Secondary | ICD-10-CM | POA: Diagnosis not present

## 2018-09-03 DIAGNOSIS — M461 Sacroiliitis, not elsewhere classified: Secondary | ICD-10-CM | POA: Diagnosis not present

## 2018-09-03 DIAGNOSIS — Z981 Arthrodesis status: Secondary | ICD-10-CM | POA: Diagnosis not present

## 2018-09-03 DIAGNOSIS — Z79891 Long term (current) use of opiate analgesic: Secondary | ICD-10-CM | POA: Diagnosis not present

## 2018-09-03 DIAGNOSIS — G894 Chronic pain syndrome: Secondary | ICD-10-CM | POA: Diagnosis not present

## 2018-09-03 DIAGNOSIS — F1120 Opioid dependence, uncomplicated: Secondary | ICD-10-CM | POA: Diagnosis not present

## 2018-10-06 DIAGNOSIS — F1120 Opioid dependence, uncomplicated: Secondary | ICD-10-CM | POA: Diagnosis not present

## 2018-10-06 DIAGNOSIS — M5136 Other intervertebral disc degeneration, lumbar region: Secondary | ICD-10-CM | POA: Diagnosis not present

## 2018-10-06 DIAGNOSIS — Z79891 Long term (current) use of opiate analgesic: Secondary | ICD-10-CM | POA: Diagnosis not present

## 2018-10-06 DIAGNOSIS — M47816 Spondylosis without myelopathy or radiculopathy, lumbar region: Secondary | ICD-10-CM | POA: Diagnosis not present

## 2018-10-06 DIAGNOSIS — M5416 Radiculopathy, lumbar region: Secondary | ICD-10-CM | POA: Diagnosis not present

## 2018-10-06 DIAGNOSIS — M461 Sacroiliitis, not elsewhere classified: Secondary | ICD-10-CM | POA: Diagnosis not present

## 2018-10-06 DIAGNOSIS — G894 Chronic pain syndrome: Secondary | ICD-10-CM | POA: Diagnosis not present

## 2018-10-06 DIAGNOSIS — Z981 Arthrodesis status: Secondary | ICD-10-CM | POA: Diagnosis not present

## 2018-10-06 DIAGNOSIS — M961 Postlaminectomy syndrome, not elsewhere classified: Secondary | ICD-10-CM | POA: Diagnosis not present

## 2018-10-06 DIAGNOSIS — Z96649 Presence of unspecified artificial hip joint: Secondary | ICD-10-CM | POA: Diagnosis not present

## 2018-10-08 DIAGNOSIS — Z23 Encounter for immunization: Secondary | ICD-10-CM | POA: Diagnosis not present

## 2018-10-31 DIAGNOSIS — I10 Essential (primary) hypertension: Secondary | ICD-10-CM | POA: Diagnosis not present

## 2018-10-31 DIAGNOSIS — E611 Iron deficiency: Secondary | ICD-10-CM | POA: Diagnosis not present

## 2018-10-31 DIAGNOSIS — D631 Anemia in chronic kidney disease: Secondary | ICD-10-CM | POA: Diagnosis not present

## 2018-10-31 DIAGNOSIS — N189 Chronic kidney disease, unspecified: Secondary | ICD-10-CM | POA: Diagnosis not present

## 2018-10-31 DIAGNOSIS — D509 Iron deficiency anemia, unspecified: Secondary | ICD-10-CM | POA: Diagnosis not present

## 2018-11-04 DIAGNOSIS — I129 Hypertensive chronic kidney disease with stage 1 through stage 4 chronic kidney disease, or unspecified chronic kidney disease: Secondary | ICD-10-CM | POA: Diagnosis not present

## 2018-11-04 DIAGNOSIS — D631 Anemia in chronic kidney disease: Secondary | ICD-10-CM | POA: Diagnosis not present

## 2018-11-04 DIAGNOSIS — G8929 Other chronic pain: Secondary | ICD-10-CM | POA: Diagnosis not present

## 2018-11-04 DIAGNOSIS — N183 Chronic kidney disease, stage 3 (moderate): Secondary | ICD-10-CM | POA: Diagnosis not present

## 2018-11-07 DIAGNOSIS — I4891 Unspecified atrial fibrillation: Secondary | ICD-10-CM | POA: Diagnosis not present

## 2018-11-07 DIAGNOSIS — E039 Hypothyroidism, unspecified: Secondary | ICD-10-CM | POA: Diagnosis not present

## 2018-11-07 DIAGNOSIS — E1140 Type 2 diabetes mellitus with diabetic neuropathy, unspecified: Secondary | ICD-10-CM | POA: Diagnosis not present

## 2018-11-07 DIAGNOSIS — Z6832 Body mass index (BMI) 32.0-32.9, adult: Secondary | ICD-10-CM | POA: Diagnosis not present

## 2018-11-07 DIAGNOSIS — E785 Hyperlipidemia, unspecified: Secondary | ICD-10-CM | POA: Diagnosis not present

## 2018-11-07 DIAGNOSIS — N184 Chronic kidney disease, stage 4 (severe): Secondary | ICD-10-CM | POA: Diagnosis not present

## 2018-11-07 DIAGNOSIS — D638 Anemia in other chronic diseases classified elsewhere: Secondary | ICD-10-CM | POA: Diagnosis not present

## 2018-11-07 DIAGNOSIS — I129 Hypertensive chronic kidney disease with stage 1 through stage 4 chronic kidney disease, or unspecified chronic kidney disease: Secondary | ICD-10-CM | POA: Diagnosis not present

## 2018-11-07 DIAGNOSIS — Z139 Encounter for screening, unspecified: Secondary | ICD-10-CM | POA: Diagnosis not present

## 2018-11-07 DIAGNOSIS — Z1339 Encounter for screening examination for other mental health and behavioral disorders: Secondary | ICD-10-CM | POA: Diagnosis not present

## 2018-11-07 DIAGNOSIS — Z1231 Encounter for screening mammogram for malignant neoplasm of breast: Secondary | ICD-10-CM | POA: Diagnosis not present

## 2018-11-10 DIAGNOSIS — F1120 Opioid dependence, uncomplicated: Secondary | ICD-10-CM | POA: Diagnosis not present

## 2018-11-10 DIAGNOSIS — G894 Chronic pain syndrome: Secondary | ICD-10-CM | POA: Diagnosis not present

## 2018-11-10 DIAGNOSIS — Z981 Arthrodesis status: Secondary | ICD-10-CM | POA: Diagnosis not present

## 2018-11-10 DIAGNOSIS — M461 Sacroiliitis, not elsewhere classified: Secondary | ICD-10-CM | POA: Diagnosis not present

## 2018-11-10 DIAGNOSIS — M961 Postlaminectomy syndrome, not elsewhere classified: Secondary | ICD-10-CM | POA: Diagnosis not present

## 2018-11-10 DIAGNOSIS — M5136 Other intervertebral disc degeneration, lumbar region: Secondary | ICD-10-CM | POA: Diagnosis not present

## 2018-11-10 DIAGNOSIS — M5416 Radiculopathy, lumbar region: Secondary | ICD-10-CM | POA: Diagnosis not present

## 2018-11-10 DIAGNOSIS — Z79891 Long term (current) use of opiate analgesic: Secondary | ICD-10-CM | POA: Diagnosis not present

## 2018-11-10 DIAGNOSIS — M47816 Spondylosis without myelopathy or radiculopathy, lumbar region: Secondary | ICD-10-CM | POA: Diagnosis not present

## 2018-11-10 DIAGNOSIS — Z96649 Presence of unspecified artificial hip joint: Secondary | ICD-10-CM | POA: Diagnosis not present

## 2018-11-20 DIAGNOSIS — Z139 Encounter for screening, unspecified: Secondary | ICD-10-CM | POA: Diagnosis not present

## 2018-11-20 DIAGNOSIS — Z6831 Body mass index (BMI) 31.0-31.9, adult: Secondary | ICD-10-CM | POA: Diagnosis not present

## 2018-11-20 DIAGNOSIS — E669 Obesity, unspecified: Secondary | ICD-10-CM | POA: Diagnosis not present

## 2018-11-20 DIAGNOSIS — Z0000 Encounter for general adult medical examination without abnormal findings: Secondary | ICD-10-CM | POA: Diagnosis not present

## 2018-11-20 DIAGNOSIS — E785 Hyperlipidemia, unspecified: Secondary | ICD-10-CM | POA: Diagnosis not present

## 2018-11-20 DIAGNOSIS — Z1239 Encounter for other screening for malignant neoplasm of breast: Secondary | ICD-10-CM | POA: Diagnosis not present

## 2018-11-29 DIAGNOSIS — Z6831 Body mass index (BMI) 31.0-31.9, adult: Secondary | ICD-10-CM | POA: Diagnosis not present

## 2018-11-29 DIAGNOSIS — J208 Acute bronchitis due to other specified organisms: Secondary | ICD-10-CM | POA: Diagnosis not present

## 2018-11-29 DIAGNOSIS — J019 Acute sinusitis, unspecified: Secondary | ICD-10-CM | POA: Diagnosis not present

## 2018-12-16 ENCOUNTER — Ambulatory Visit (INDEPENDENT_AMBULATORY_CARE_PROVIDER_SITE_OTHER): Payer: PPO | Admitting: Cardiology

## 2018-12-16 ENCOUNTER — Encounter: Payer: Self-pay | Admitting: Cardiology

## 2018-12-16 VITALS — BP 146/80 | HR 61 | Ht 62.0 in | Wt 169.8 lb

## 2018-12-16 DIAGNOSIS — I482 Chronic atrial fibrillation, unspecified: Secondary | ICD-10-CM | POA: Diagnosis not present

## 2018-12-16 DIAGNOSIS — I10 Essential (primary) hypertension: Secondary | ICD-10-CM | POA: Diagnosis not present

## 2018-12-16 DIAGNOSIS — R0989 Other specified symptoms and signs involving the circulatory and respiratory systems: Secondary | ICD-10-CM | POA: Diagnosis not present

## 2018-12-16 DIAGNOSIS — E782 Mixed hyperlipidemia: Secondary | ICD-10-CM

## 2018-12-16 NOTE — Addendum Note (Signed)
Addended by: Ashok Norris on: 12/16/2018 03:09 PM   Modules accepted: Orders

## 2018-12-16 NOTE — Patient Instructions (Signed)
Medication Instructions:  Your physician recommends that you continue on your current medications as directed. Please refer to the Current Medication list given to you today.  If you need a refill on your cardiac medications before your next appointment, please call your pharmacy.   Lab work: Your physician recommends that you return for lab work today: Cbc,Cmp  If you have labs (blood work) drawn today and your tests are completely normal, you will receive your results only by: Marland Kitchen MyChart Message (if you have MyChart) OR . A paper copy in the mail If you have any lab test that is abnormal or we need to change your treatment, we will call you to review the results.  Testing/Procedures: None.   Follow-Up: At [facility], you and your health needs are our priority.  As part of our continuing mission to provide you with exceptional heart care, we have created designated Provider Care Teams.  These Care Teams include your primary Cardiologist (physician) and Advanced Practice Providers (APPs -  Physician Assistants and Nurse Practitioners) who all work together to provide you with the care you need, when you need it. You will need a follow up appointment in 3 months.  Please call our office 2 months in advance to schedule this appointment.  You may see No primary care provider on file. or another member of our [employer] Provider Team in [place]: Shirlee More, MD . Jyl Heinz, MD  Any Other Special Instructions Will Be Listed Below (If Applicable).

## 2018-12-16 NOTE — Progress Notes (Signed)
Cardiology Office Note:    Date:  12/16/2018   ID:  Robin Brewer, DOB 1946/04/21, MRN [id number]  PCP:  Nicoletta Dress, MD  Cardiologist:  Jenne Campus, MD    Referring MD: Nicoletta Dress, MD   Chief Complaint  Patient presents with  . Follow-up  Doing better  History of Present Illness:    Robin Brewer is a 72 y.o. female with coronary disease, peripheral vascular disease, paroxysmal atrial fibrillation comes today to my office for follow-up apparently about 10 days ago he she had up having some bronchitis she went to her primary care physician who gave her Kenalog shot as well as some intramuscular antibiotic and then she was put on some antibiotic orally however when she started taking this medication she started feeling very weak tired exhausted she was unable to get up she was unable to walk finally after 4 days she stopped antibiotic and started gradually feeling better now she said she can walk around.  No chest pain tightness squeezing pressure burning chest.  No palpitations.    Past Medical History:  Diagnosis Date  . Anxiety   . Anxiety state, unspecified 08/17/2013  . Arthritis   . Atrial fibrillation ([place])   . Bronchitis    hx of   . Chronic pain syndrome 08/17/2013  . Depression   . Diabetes ([place]) 08/17/2013  . Diabetes mellitus without complication ([place])   . HTN (hypertension) 08/17/2013  . Hyperlipidemia   . Hypertension   . Hypothyroidism   . Right carotid bruit   . S/P total hip arthroplasty 08/17/2013   Right. 08/12/13  . Shortness of breath    with exertion   . Unspecified hypothyroidism 08/17/2013    Past Surgical History:  Procedure Laterality Date  . ABDOMINAL HYSTERECTOMY    . BACK SURGERY    . NECK SURGERY    . TOTAL HIP ARTHROPLASTY Right 08/12/2013   Procedure: RIGHT TOTAL HIP ARTHROPLASTY ANTERIOR APPROACH;  Surgeon: [name], MD;  Location: [facility];  Service: Orthopedics;  Laterality: Right;    Current Medications: Current  Meds  Medication Sig  . albuterol (PROAIR HFA) 108 (90 BASE) MCG/ACT inhaler Inhale 2 puffs into the lungs every 6 (six) hours as needed for wheezing.  Marland Kitchen amLODipine (NORVASC) 5 MG tablet Take 1 tablet by mouth daily.   Marland Kitchen ELIQUIS 5 MG TABS tablet Take 1 tablet by mouth 2 (two) times daily.  . furosemide (LASIX) 40 MG tablet Take 1.5 tablets (60 mg) by mouth once daily. (Patient taking differently: 40 mg. Take 2 tablets in the morning and 1 tablet in the afternoon)  . HYDROcodone-acetaminophen (NORCO) 10-325 MG tablet Take 1 tablet by mouth 2 (two) times daily.  [name] Ethyl (VASCEPA) 1 g CAPS Take 1 capsule by mouth 2 (two) times daily.  . insulin aspart (NOVOLOG) 100 UNIT/ML injection Inject 12 Units into the skin 3 (three) times daily with meals.   . insulin detemir (LEVEMIR) 100 UNIT/ML injection Inject 0.3 mLs (30 Units total) into the skin daily. Patient takes in the am (Patient taking differently: Inject 65 Units into the skin daily. Patient takes in the am)  . labetalol (NORMODYNE) 300 MG tablet Take 1 tablet by mouth 2 (two) times daily.  Marland Kitchen levothyroxine (SYNTHROID, LEVOTHROID) 50 MCG tablet Take 50 mcg by mouth daily before breakfast.  . omeprazole (PRILOSEC) 20 MG capsule Take 1 capsule by mouth daily.  . ranolazine (RANEXA) 500 MG 12 hr tablet Take 1  tablet by mouth 2 (two) times daily.  . rosuvastatin (CRESTOR) 40 MG tablet Take 40 mg by mouth every evening.   . sertraline (ZOLOFT) 100 MG tablet Take 100 mg by mouth every evening.     Allergies:   Aspirin and Niacin and related   Social History   Socioeconomic History  . Marital status: Married    Spouse name: Not on file  . Number of children: Not on file  . Years of education: Not on file  . Highest education level: Not on file  Occupational History  . Not on file  Social Needs  . Financial resource strain: Not on file  . Food insecurity:    Worry: Not on file    Inability: Not on file  . Transportation needs:     Medical: Not on file    Non-medical: Not on file  Tobacco Use  . Smoking status: Former Smoker    Packs/day: 0.25    Types: Cigarettes  . Smokeless tobacco: Never Used  Substance and Sexual Activity  . Alcohol use: No  . Drug use: No  . Sexual activity: Not Currently  Lifestyle  . Physical activity:    Days per week: Not on file    Minutes per session: Not on file  . Stress: Not on file  Relationships  . Social connections:    Talks on phone: Not on file    Gets together: Not on file    Attends religious service: Not on file    Active member of club or organization: Not on file    Attends meetings of clubs or organizations: Not on file    Relationship status: Not on file  Other Topics Concern  . Not on file  Social History Narrative  . Not on file     Family History: The patient's family history includes Heart disease in her mother; Transient ischemic attack in her mother. ROS:   Please see the history of present illness.    All 14 point review of systems negative except as described per history of present illness  EKGs/Labs/Other Studies Reviewed:      Recent Labs: 07/03/2018: NT-Pro BNP 553 07/11/2018: BUN 30; Creatinine, Ser 2.52; Potassium 4.2; Sodium 138  Recent Lipid Panel No results found for: CHOL, TRIG, HDL, CHOLHDL, VLDL, LDLCALC, LDLDIRECT  Physical Exam:    VS:  BP (!) 146/80   Pulse 61   Ht $R'5\' 2"'CA$  (1.575 m)   Wt 169 lb 12.8 oz (77 kg)   SpO2 97%   BMI 31.06 kg/m     Wt Readings from Last 3 Encounters:  12/16/18 169 lb 12.8 oz (77 kg)  06/20/18 174 lb (78.9 kg)  09/18/17 160 lb (72.6 kg)     GEN:  Well nourished, well developed in no acute distress HEENT: Normal NECK: No JVD; No carotid bruits LYMPHATICS: No lymphadenopathy CARDIAC: RRR, no murmurs, no rubs, no gallops RESPIRATORY:  Clear to auscultation without rales, wheezing or rhonchi  ABDOMEN: Soft, non-tender, non-distended MUSCULOSKELETAL:  No edema; No deformity  SKIN: Warm and  dry LOWER EXTREMITIES: no swelling NEUROLOGIC:  Alert and oriented x 3 PSYCHIATRIC:  Normal affect   ASSESSMENT:    1. Atrial fibrillation, chronic   2. Essential hypertension   3. Mixed hyperlipidemia   4. Right carotid bruit    PLAN:    In order of problems listed above:  1. Atrial fibrillation which is permanent.  Anticoagulated which I will continue. 2. Essential hypertension blood pressure slightly  elevated but she is still recovering from cold symptoms will wait for this to stabilize before reassessing the scenario again 3. Mixed dyslipidemia on appropriate medications that he will continue 4. Right carotid bruit last carotid ultrasound did not show any critical lesions. 5. I will ask her to have CBC as well as complete metabolic panel.  EKG will be done as well   Medication Adjustments/Labs and Tests Ordered: Current medicines are reviewed at length with the patient today.  Concerns regarding medicines are outlined above.  No orders of the defined types were placed in this encounter.  Medication changes: No orders of the defined types were placed in this encounter.   Signed, Park Liter, MD, [facility] 12/16/2018 2:48 PM    [place]

## 2018-12-17 LAB — CBC
Hematocrit: 33.5 % — ABNORMAL LOW (ref 34.0–46.6)
Hemoglobin: 11.4 g/dL (ref 11.1–15.9)
MCH: 27.9 pg (ref 26.6–33.0)
MCHC: 34 g/dL (ref 31.5–35.7)
MCV: 82 fL (ref 79–97)
Platelets: 174 10*3/uL (ref 150–450)
RBC: 4.08 x10E6/uL (ref 3.77–5.28)
RDW: 13.2 % (ref 12.3–15.4)
WBC: 8.6 10*3/uL (ref 3.4–10.8)

## 2018-12-17 LAB — COMPREHENSIVE METABOLIC PANEL
A/G RATIO: 2.1 (ref 1.2–2.2)
ALT: 15 IU/L (ref 0–32)
AST: 15 IU/L (ref 0–40)
Albumin: 3.8 g/dL (ref 3.5–4.8)
Alkaline Phosphatase: 116 IU/L (ref 39–117)
BUN/Creatinine Ratio: 14 (ref 12–28)
BUN: 43 mg/dL — AB (ref 8–27)
Bilirubin Total: 0.2 mg/dL (ref 0.0–1.2)
CO2: 22 mmol/L (ref 20–29)
CREATININE: 2.98 mg/dL — AB (ref 0.57–1.00)
Calcium: 7.8 mg/dL — ABNORMAL LOW (ref 8.7–10.3)
Chloride: 100 mmol/L (ref 96–106)
GFR, EST AFRICAN AMERICAN: 17 mL/min/{1.73_m2} — AB (ref >59)
GFR, EST NON AFRICAN AMERICAN: 15 mL/min/{1.73_m2} — AB (ref >59)
GLUCOSE: 278 mg/dL — AB (ref 65–99)
Globulin, Total: 1.8 g/dL (ref 1.5–4.5)
POTASSIUM: 4 mmol/L (ref 3.5–5.2)
Sodium: 140 mmol/L (ref 134–144)
TOTAL PROTEIN: 5.6 g/dL — AB (ref 6.0–8.5)

## 2018-12-18 DIAGNOSIS — M461 Sacroiliitis, not elsewhere classified: Secondary | ICD-10-CM | POA: Diagnosis not present

## 2018-12-18 DIAGNOSIS — Z981 Arthrodesis status: Secondary | ICD-10-CM | POA: Diagnosis not present

## 2018-12-18 DIAGNOSIS — Z79891 Long term (current) use of opiate analgesic: Secondary | ICD-10-CM | POA: Diagnosis not present

## 2018-12-18 DIAGNOSIS — M47816 Spondylosis without myelopathy or radiculopathy, lumbar region: Secondary | ICD-10-CM | POA: Diagnosis not present

## 2018-12-18 DIAGNOSIS — M961 Postlaminectomy syndrome, not elsewhere classified: Secondary | ICD-10-CM | POA: Diagnosis not present

## 2018-12-18 DIAGNOSIS — M5136 Other intervertebral disc degeneration, lumbar region: Secondary | ICD-10-CM | POA: Diagnosis not present

## 2018-12-18 DIAGNOSIS — M5416 Radiculopathy, lumbar region: Secondary | ICD-10-CM | POA: Diagnosis not present

## 2018-12-18 DIAGNOSIS — Z96649 Presence of unspecified artificial hip joint: Secondary | ICD-10-CM | POA: Diagnosis not present

## 2018-12-18 DIAGNOSIS — F1120 Opioid dependence, uncomplicated: Secondary | ICD-10-CM | POA: Diagnosis not present

## 2018-12-18 DIAGNOSIS — G894 Chronic pain syndrome: Secondary | ICD-10-CM | POA: Diagnosis not present

## 2018-12-19 DIAGNOSIS — Z1231 Encounter for screening mammogram for malignant neoplasm of breast: Secondary | ICD-10-CM | POA: Diagnosis not present

## 2018-12-22 ENCOUNTER — Telehealth: Payer: Self-pay | Admitting: Emergency Medicine

## 2018-12-22 DIAGNOSIS — I10 Essential (primary) hypertension: Secondary | ICD-10-CM

## 2018-12-22 NOTE — Telephone Encounter (Signed)
Patient informed of results and informed to have labs redrawn in 2 weeks. She verbally understands.

## 2019-01-05 DIAGNOSIS — I10 Essential (primary) hypertension: Secondary | ICD-10-CM | POA: Diagnosis not present

## 2019-01-06 LAB — BASIC METABOLIC PANEL
BUN/Creatinine Ratio: 12 (ref 12–28)
BUN: 31 mg/dL — ABNORMAL HIGH (ref 8–27)
CHLORIDE: 98 mmol/L (ref 96–106)
CO2: 21 mmol/L (ref 20–29)
Calcium: 7.5 mg/dL — ABNORMAL LOW (ref 8.7–10.3)
Creatinine, Ser: 2.59 mg/dL — ABNORMAL HIGH (ref 0.57–1.00)
GFR, EST AFRICAN AMERICAN: 21 mL/min/{1.73_m2} — AB (ref >59)
GFR, EST NON AFRICAN AMERICAN: 18 mL/min/{1.73_m2} — AB (ref >59)
Glucose: 349 mg/dL — ABNORMAL HIGH (ref 65–99)
POTASSIUM: 4.5 mmol/L (ref 3.5–5.2)
SODIUM: 140 mmol/L (ref 134–144)

## 2019-01-21 DIAGNOSIS — M961 Postlaminectomy syndrome, not elsewhere classified: Secondary | ICD-10-CM | POA: Diagnosis not present

## 2019-01-21 DIAGNOSIS — G894 Chronic pain syndrome: Secondary | ICD-10-CM | POA: Diagnosis not present

## 2019-01-21 DIAGNOSIS — M461 Sacroiliitis, not elsewhere classified: Secondary | ICD-10-CM | POA: Diagnosis not present

## 2019-01-21 DIAGNOSIS — F1120 Opioid dependence, uncomplicated: Secondary | ICD-10-CM | POA: Diagnosis not present

## 2019-01-21 DIAGNOSIS — Z981 Arthrodesis status: Secondary | ICD-10-CM | POA: Diagnosis not present

## 2019-01-21 DIAGNOSIS — M5416 Radiculopathy, lumbar region: Secondary | ICD-10-CM | POA: Diagnosis not present

## 2019-01-21 DIAGNOSIS — Z96649 Presence of unspecified artificial hip joint: Secondary | ICD-10-CM | POA: Diagnosis not present

## 2019-01-21 DIAGNOSIS — M5136 Other intervertebral disc degeneration, lumbar region: Secondary | ICD-10-CM | POA: Diagnosis not present

## 2019-01-21 DIAGNOSIS — Z79891 Long term (current) use of opiate analgesic: Secondary | ICD-10-CM | POA: Diagnosis not present

## 2019-01-21 DIAGNOSIS — M47816 Spondylosis without myelopathy or radiculopathy, lumbar region: Secondary | ICD-10-CM | POA: Diagnosis not present

## 2019-02-10 DIAGNOSIS — D638 Anemia in other chronic diseases classified elsewhere: Secondary | ICD-10-CM | POA: Diagnosis not present

## 2019-02-10 DIAGNOSIS — E785 Hyperlipidemia, unspecified: Secondary | ICD-10-CM | POA: Diagnosis not present

## 2019-02-10 DIAGNOSIS — E039 Hypothyroidism, unspecified: Secondary | ICD-10-CM | POA: Diagnosis not present

## 2019-02-10 DIAGNOSIS — N184 Chronic kidney disease, stage 4 (severe): Secondary | ICD-10-CM | POA: Diagnosis not present

## 2019-02-10 DIAGNOSIS — I129 Hypertensive chronic kidney disease with stage 1 through stage 4 chronic kidney disease, or unspecified chronic kidney disease: Secondary | ICD-10-CM | POA: Diagnosis not present

## 2019-02-10 DIAGNOSIS — Z9181 History of falling: Secondary | ICD-10-CM | POA: Diagnosis not present

## 2019-02-10 DIAGNOSIS — I4891 Unspecified atrial fibrillation: Secondary | ICD-10-CM | POA: Diagnosis not present

## 2019-02-10 DIAGNOSIS — E1140 Type 2 diabetes mellitus with diabetic neuropathy, unspecified: Secondary | ICD-10-CM | POA: Diagnosis not present

## 2019-02-10 DIAGNOSIS — E1165 Type 2 diabetes mellitus with hyperglycemia: Secondary | ICD-10-CM | POA: Diagnosis not present

## 2019-02-26 DIAGNOSIS — M5416 Radiculopathy, lumbar region: Secondary | ICD-10-CM | POA: Diagnosis not present

## 2019-02-26 DIAGNOSIS — Z981 Arthrodesis status: Secondary | ICD-10-CM | POA: Diagnosis not present

## 2019-02-26 DIAGNOSIS — M47816 Spondylosis without myelopathy or radiculopathy, lumbar region: Secondary | ICD-10-CM | POA: Diagnosis not present

## 2019-02-26 DIAGNOSIS — Z79891 Long term (current) use of opiate analgesic: Secondary | ICD-10-CM | POA: Diagnosis not present

## 2019-02-26 DIAGNOSIS — M961 Postlaminectomy syndrome, not elsewhere classified: Secondary | ICD-10-CM | POA: Diagnosis not present

## 2019-02-26 DIAGNOSIS — M5136 Other intervertebral disc degeneration, lumbar region: Secondary | ICD-10-CM | POA: Diagnosis not present

## 2019-02-26 DIAGNOSIS — M461 Sacroiliitis, not elsewhere classified: Secondary | ICD-10-CM | POA: Diagnosis not present

## 2019-02-26 DIAGNOSIS — F1120 Opioid dependence, uncomplicated: Secondary | ICD-10-CM | POA: Diagnosis not present

## 2019-02-26 DIAGNOSIS — G894 Chronic pain syndrome: Secondary | ICD-10-CM | POA: Diagnosis not present

## 2019-02-26 DIAGNOSIS — Z96649 Presence of unspecified artificial hip joint: Secondary | ICD-10-CM | POA: Diagnosis not present

## 2019-04-02 DIAGNOSIS — M5416 Radiculopathy, lumbar region: Secondary | ICD-10-CM | POA: Diagnosis not present

## 2019-04-02 DIAGNOSIS — F1120 Opioid dependence, uncomplicated: Secondary | ICD-10-CM | POA: Diagnosis not present

## 2019-04-02 DIAGNOSIS — Z96649 Presence of unspecified artificial hip joint: Secondary | ICD-10-CM | POA: Diagnosis not present

## 2019-04-02 DIAGNOSIS — Z981 Arthrodesis status: Secondary | ICD-10-CM | POA: Diagnosis not present

## 2019-04-02 DIAGNOSIS — Z79891 Long term (current) use of opiate analgesic: Secondary | ICD-10-CM | POA: Diagnosis not present

## 2019-04-02 DIAGNOSIS — R269 Unspecified abnormalities of gait and mobility: Secondary | ICD-10-CM | POA: Diagnosis not present

## 2019-04-02 DIAGNOSIS — M461 Sacroiliitis, not elsewhere classified: Secondary | ICD-10-CM | POA: Diagnosis not present

## 2019-04-02 DIAGNOSIS — M5136 Other intervertebral disc degeneration, lumbar region: Secondary | ICD-10-CM | POA: Diagnosis not present

## 2019-04-02 DIAGNOSIS — G894 Chronic pain syndrome: Secondary | ICD-10-CM | POA: Diagnosis not present

## 2019-04-02 DIAGNOSIS — M25475 Effusion, left foot: Secondary | ICD-10-CM | POA: Diagnosis not present

## 2019-04-02 DIAGNOSIS — M25472 Effusion, left ankle: Secondary | ICD-10-CM | POA: Diagnosis not present

## 2019-04-02 DIAGNOSIS — M25572 Pain in left ankle and joints of left foot: Secondary | ICD-10-CM | POA: Diagnosis not present

## 2019-04-02 DIAGNOSIS — M47816 Spondylosis without myelopathy or radiculopathy, lumbar region: Secondary | ICD-10-CM | POA: Diagnosis not present

## 2019-04-02 DIAGNOSIS — M961 Postlaminectomy syndrome, not elsewhere classified: Secondary | ICD-10-CM | POA: Diagnosis not present

## 2019-04-06 ENCOUNTER — Telehealth: Payer: Self-pay

## 2019-04-06 NOTE — Telephone Encounter (Signed)
Tried to contact patient about 4/28 appointment. Will call back later to get consent.

## 2019-04-08 ENCOUNTER — Ambulatory Visit: Payer: PPO | Admitting: Cardiology

## 2019-04-14 ENCOUNTER — Other Ambulatory Visit: Payer: Self-pay

## 2019-04-14 ENCOUNTER — Telehealth (INDEPENDENT_AMBULATORY_CARE_PROVIDER_SITE_OTHER): Payer: PPO | Admitting: Cardiology

## 2019-04-14 ENCOUNTER — Encounter: Payer: Self-pay | Admitting: Cardiology

## 2019-04-14 VITALS — BP 138/65 | HR 62 | Wt 175.0 lb

## 2019-04-14 DIAGNOSIS — I10 Essential (primary) hypertension: Secondary | ICD-10-CM

## 2019-04-14 DIAGNOSIS — Z794 Long term (current) use of insulin: Secondary | ICD-10-CM

## 2019-04-14 DIAGNOSIS — E782 Mixed hyperlipidemia: Secondary | ICD-10-CM

## 2019-04-14 DIAGNOSIS — I482 Chronic atrial fibrillation, unspecified: Secondary | ICD-10-CM

## 2019-04-14 DIAGNOSIS — R809 Proteinuria, unspecified: Secondary | ICD-10-CM

## 2019-04-14 DIAGNOSIS — E1129 Type 2 diabetes mellitus with other diabetic kidney complication: Secondary | ICD-10-CM

## 2019-04-14 DIAGNOSIS — R0989 Other specified symptoms and signs involving the circulatory and respiratory systems: Secondary | ICD-10-CM

## 2019-04-14 NOTE — Progress Notes (Signed)
Virtual Visit via Telephone Note   This visit type was conducted due to national recommendations for restrictions regarding the COVID-19 Pandemic (e.g. social distancing) in an effort to limit this patient's exposure and mitigate transmission in our community.  Due to her co-morbid illnesses, this patient is at least at moderate risk for complications without adequate follow up.  This format is felt to be most appropriate for this patient at this time.  The patient did not have access to video technology/had technical difficulties with video requiring transitioning to audio format only (telephone).  All issues noted in this document were discussed and addressed.  No physical exam could be performed with this format.  Please refer to the patient's chart for her  consent to telehealth for [facility].  Evaluation Performed:  Follow-up visit  This visit type was conducted due to national recommendations for restrictions regarding the COVID-19 Pandemic (e.g. social distancing).  This format is felt to be most appropriate for this patient at this time.  All issues noted in this document were discussed and addressed.  No physical exam was performed (except for noted visual exam findings with Video Visits).  Please refer to the patient's chart (MyChart message for video visits and phone note for telephone visits) for the patient's consent to telehealth for [facility].  Date:  04/14/2019  ID: Robin Brewer, DOB 1946-06-13, MRN [id number]   Patient Location: [place] [address] [place] [address]   Provider location:   [place] Office  PCP:  Nicoletta Dress, MD  Cardiologist:  Jenne Campus, MD     Chief Complaint: Doing well  History of Present Illness:    Robin Brewer is a 73 y.o. female  who presents via audio/video conferencing for a telehealth visit today.  With peripheral vascular disease, permanent atrial fibrillation, depression, diabetes, hypertension we do a  phone visit today.  She is unable to establish video link.  She simply has no technical ability to do that.  Doing well denies have any chest pain tightness squeezing pressure burning chest she is staying home trying to keep up with [place] [address] restrictions.  She likes cooking and she does a lot.  Denies having any cardiac symptoms no palpitations.   The patient does not have symptoms concerning for COVID-19 infection (fever, chills, cough, or new SHORTNESS OF BREATH).    Prior CV studies:   The following studies were reviewed today:       Past Medical History:  Diagnosis Date   Anxiety    Anxiety state, unspecified 08/17/2013   Arthritis    Atrial fibrillation (HCC)    Bronchitis    hx of    Chronic pain syndrome 08/17/2013   Depression    Diabetes ([place]) 08/17/2013   Diabetes mellitus without complication (HCC)    HTN (hypertension) 08/17/2013   Hyperlipidemia    Hypertension    Hypothyroidism    Right carotid bruit    S/P total hip arthroplasty 08/17/2013   Right. 08/12/13   Shortness of breath    with exertion    Unspecified hypothyroidism 08/17/2013    Past Surgical History:  Procedure Laterality Date   ABDOMINAL HYSTERECTOMY     BACK SURGERY     NECK SURGERY     TOTAL HIP ARTHROPLASTY Right 08/12/2013   Procedure: RIGHT TOTAL HIP ARTHROPLASTY ANTERIOR APPROACH;  Surgeon: [name], MD;  Location: [facility];  Service: Orthopedics;  Laterality: Right;     Current Meds  Medication Sig   albuterol (PROAIR HFA) 108 (90 BASE) MCG/ACT inhaler Inhale 2 puffs into the lungs every 6 (six) hours as needed for wheezing.   amLODipine (NORVASC) 5 MG tablet Take 1 tablet by mouth daily.    ELIQUIS 5 MG TABS tablet Take 1 tablet by mouth 2 (two) times daily.   furosemide (LASIX) 40 MG tablet Take 1.5 tablets (60 mg) by mouth once daily. (Patient taking differently: 40 mg. Take 2 tablets in the morning and 1 tablet in the afternoon)   HYDROcodone-acetaminophen  (NORCO) 10-325 MG tablet Take 1 tablet by mouth 2 (two) times daily.   Icosapent Ethyl (VASCEPA) 1 g CAPS Take 1 capsule by mouth 2 (two) times daily.   insulin aspart (NOVOLOG) 100 UNIT/ML injection Inject 12 Units into the skin 3 (three) times daily with meals.    insulin detemir (LEVEMIR) 100 UNIT/ML injection Inject 0.3 mLs (30 Units total) into the skin daily. Patient takes in the am (Patient taking differently: Inject 65 Units into the skin daily. Patient takes in the am)   labetalol (NORMODYNE) 300 MG tablet Take 1 tablet by mouth 2 (two) times daily.   levothyroxine (SYNTHROID, LEVOTHROID) 50 MCG tablet Take 50 mcg by mouth daily before breakfast.   omeprazole (PRILOSEC) 20 MG capsule Take 1 capsule by mouth daily.   ranolazine (RANEXA) 500 MG 12 hr tablet Take 1 tablet by mouth 2 (two) times daily.   rosuvastatin (CRESTOR) 40 MG tablet Take 40 mg by mouth every evening.    sertraline (ZOLOFT) 100 MG tablet Take 100 mg by mouth every evening.      Family History: The patient's family history includes Heart disease in her mother; Transient ischemic attack in her mother.   ROS:   Please see the history of present illness.     All other systems reviewed and are negative.   Labs/Other Tests and Data Reviewed:     Recent Labs: 07/03/2018: NT-Pro BNP 553 12/16/2018: ALT 15; Hemoglobin 11.4; Platelets 174 01/05/2019: BUN 31; Creatinine, Ser 2.59; Potassium 4.5; Sodium 140  Recent Lipid Panel No results found for: CHOL, TRIG, HDL, CHOLHDL, VLDL, LDLCALC, LDLDIRECT    Exam:    Vital Signs:  BP 138/65    Pulse 62    Wt 175 lb (79.4 kg)    SpO2 97%    BMI 32.01 kg/m     Wt Readings from Last 3 Encounters:  04/14/19 175 lb (79.4 kg)  12/16/18 169 lb 12.8 oz (77 kg)  06/20/18 174 lb (78.9 kg)     Well nourished, well developed in no acute distress. Alert awake oriented x3 not in any distress  Diagnosis for this visit:   1. Essential hypertension   2. Atrial  fibrillation, chronic   3. Type 2 diabetes mellitus with microalbuminuria, with long-term current use of insulin (HCC)   4. Right carotid bruit   5. Mixed hyperlipidemia      ASSESSMENT & PLAN:    1.  Essential hypertension blood pressure well controlled continue present management. 2.  Permanent atrial fibrillation anticoagulated with Eliquis which I will continue.  Denies have any palpitations. 3.  Type 2 diabetes apparently stable. 4.  Right carotid bruit last carotid ultrasound showed no significant stenosis we will repeat the test in about 6 months. 5.  Dyslipidemia: We will call her primary care physician to get fasting lipid profile  COVID-19 Education: The signs and symptoms of COVID-19 were discussed with the patient and how to seek care  for testing (follow up with PCP or arrange E-visit).  The importance of social distancing was discussed today.  Patient Risk:   After full review of this patients clinical status, I feel that they are at least moderate risk at this time.  Time:   Today, I have spent 17 minutes with the patient with telehealth technology discussing pt health issues.  I spent 5 minutes reviewing her chart before the visit.  Visit was finished at 1:47 PM.    Medication Adjustments/Labs and Tests Ordered: Current medicines are reviewed at length with the patient today.  Concerns regarding medicines are outlined above.  No orders of the defined types were placed in this encounter.  Medication changes: No orders of the defined types were placed in this encounter.    Disposition:  5 months  Signed, Park Liter, MD, [facility] 04/14/2019 1:49 PM    [place]

## 2019-04-14 NOTE — Patient Instructions (Signed)
Medication Instructions:  Your physician recommends that you continue on your current medications as directed. Please refer to the Current Medication list given to you today.  If you need a refill on your cardiac medications before your next appointment, please call your pharmacy.   Lab work: None If you have labs (blood work) drawn today and your tests are completely normal, you will receive your results only by: Marland Kitchen MyChart Message (if you have MyChart) OR . A paper copy in the mail If you have any lab test that is abnormal or we need to change your treatment, we will call you to review the results.  Testing/Procedures: None  Follow-Up: At [facility], you and your health needs are our priority.  As part of our continuing mission to provide you with exceptional heart care, we have created designated Provider Care Teams.  These Care Teams include your primary Cardiologist (physician) and Advanced Practice Providers (APPs -  Physician Assistants and Nurse Practitioners) who all work together to provide you with the care you need, when you need it. You will need a follow up appointment in 5 months Any Other Special Instructions Will Be Listed Below (If Applicable).

## 2019-05-07 DIAGNOSIS — Z79891 Long term (current) use of opiate analgesic: Secondary | ICD-10-CM | POA: Diagnosis not present

## 2019-05-07 DIAGNOSIS — M47816 Spondylosis without myelopathy or radiculopathy, lumbar region: Secondary | ICD-10-CM | POA: Diagnosis not present

## 2019-05-07 DIAGNOSIS — G894 Chronic pain syndrome: Secondary | ICD-10-CM | POA: Diagnosis not present

## 2019-05-07 DIAGNOSIS — M5416 Radiculopathy, lumbar region: Secondary | ICD-10-CM | POA: Diagnosis not present

## 2019-05-07 DIAGNOSIS — M961 Postlaminectomy syndrome, not elsewhere classified: Secondary | ICD-10-CM | POA: Diagnosis not present

## 2019-05-07 DIAGNOSIS — M461 Sacroiliitis, not elsewhere classified: Secondary | ICD-10-CM | POA: Diagnosis not present

## 2019-05-07 DIAGNOSIS — Z981 Arthrodesis status: Secondary | ICD-10-CM | POA: Diagnosis not present

## 2019-05-07 DIAGNOSIS — M5136 Other intervertebral disc degeneration, lumbar region: Secondary | ICD-10-CM | POA: Diagnosis not present

## 2019-05-07 DIAGNOSIS — F1120 Opioid dependence, uncomplicated: Secondary | ICD-10-CM | POA: Diagnosis not present

## 2019-05-07 DIAGNOSIS — Z96649 Presence of unspecified artificial hip joint: Secondary | ICD-10-CM | POA: Diagnosis not present

## 2019-05-14 DIAGNOSIS — E1140 Type 2 diabetes mellitus with diabetic neuropathy, unspecified: Secondary | ICD-10-CM | POA: Diagnosis not present

## 2019-05-14 DIAGNOSIS — I4891 Unspecified atrial fibrillation: Secondary | ICD-10-CM | POA: Diagnosis not present

## 2019-05-14 DIAGNOSIS — E039 Hypothyroidism, unspecified: Secondary | ICD-10-CM | POA: Diagnosis not present

## 2019-05-14 DIAGNOSIS — E785 Hyperlipidemia, unspecified: Secondary | ICD-10-CM | POA: Diagnosis not present

## 2019-05-14 DIAGNOSIS — I129 Hypertensive chronic kidney disease with stage 1 through stage 4 chronic kidney disease, or unspecified chronic kidney disease: Secondary | ICD-10-CM | POA: Diagnosis not present

## 2019-05-14 DIAGNOSIS — D638 Anemia in other chronic diseases classified elsewhere: Secondary | ICD-10-CM | POA: Diagnosis not present

## 2019-05-14 DIAGNOSIS — E1165 Type 2 diabetes mellitus with hyperglycemia: Secondary | ICD-10-CM | POA: Diagnosis not present

## 2019-05-14 DIAGNOSIS — N184 Chronic kidney disease, stage 4 (severe): Secondary | ICD-10-CM | POA: Diagnosis not present

## 2019-05-20 DIAGNOSIS — D638 Anemia in other chronic diseases classified elsewhere: Secondary | ICD-10-CM | POA: Diagnosis not present

## 2019-05-20 DIAGNOSIS — E039 Hypothyroidism, unspecified: Secondary | ICD-10-CM | POA: Diagnosis not present

## 2019-05-20 DIAGNOSIS — E1140 Type 2 diabetes mellitus with diabetic neuropathy, unspecified: Secondary | ICD-10-CM | POA: Diagnosis not present

## 2019-05-20 DIAGNOSIS — E785 Hyperlipidemia, unspecified: Secondary | ICD-10-CM | POA: Diagnosis not present

## 2019-06-04 DIAGNOSIS — Z981 Arthrodesis status: Secondary | ICD-10-CM | POA: Diagnosis not present

## 2019-06-04 DIAGNOSIS — M545 Low back pain: Secondary | ICD-10-CM | POA: Diagnosis not present

## 2019-06-04 DIAGNOSIS — G8929 Other chronic pain: Secondary | ICD-10-CM | POA: Diagnosis not present

## 2019-06-04 DIAGNOSIS — G894 Chronic pain syndrome: Secondary | ICD-10-CM | POA: Diagnosis not present

## 2019-06-04 DIAGNOSIS — M5136 Other intervertebral disc degeneration, lumbar region: Secondary | ICD-10-CM | POA: Diagnosis not present

## 2019-06-04 DIAGNOSIS — M5416 Radiculopathy, lumbar region: Secondary | ICD-10-CM | POA: Diagnosis not present

## 2019-06-04 DIAGNOSIS — M461 Sacroiliitis, not elsewhere classified: Secondary | ICD-10-CM | POA: Diagnosis not present

## 2019-06-04 DIAGNOSIS — Z96649 Presence of unspecified artificial hip joint: Secondary | ICD-10-CM | POA: Diagnosis not present

## 2019-06-04 DIAGNOSIS — F1120 Opioid dependence, uncomplicated: Secondary | ICD-10-CM | POA: Diagnosis not present

## 2019-06-04 DIAGNOSIS — M47816 Spondylosis without myelopathy or radiculopathy, lumbar region: Secondary | ICD-10-CM | POA: Diagnosis not present

## 2019-06-04 DIAGNOSIS — Z79891 Long term (current) use of opiate analgesic: Secondary | ICD-10-CM | POA: Diagnosis not present

## 2019-06-04 DIAGNOSIS — I700 Atherosclerosis of aorta: Secondary | ICD-10-CM | POA: Diagnosis not present

## 2019-06-04 DIAGNOSIS — M961 Postlaminectomy syndrome, not elsewhere classified: Secondary | ICD-10-CM | POA: Diagnosis not present

## 2019-06-04 DIAGNOSIS — M419 Scoliosis, unspecified: Secondary | ICD-10-CM | POA: Diagnosis not present

## 2019-07-09 DIAGNOSIS — M961 Postlaminectomy syndrome, not elsewhere classified: Secondary | ICD-10-CM | POA: Diagnosis not present

## 2019-07-09 DIAGNOSIS — G894 Chronic pain syndrome: Secondary | ICD-10-CM | POA: Diagnosis not present

## 2019-07-09 DIAGNOSIS — M461 Sacroiliitis, not elsewhere classified: Secondary | ICD-10-CM | POA: Diagnosis not present

## 2019-07-09 DIAGNOSIS — F1120 Opioid dependence, uncomplicated: Secondary | ICD-10-CM | POA: Diagnosis not present

## 2019-07-09 DIAGNOSIS — Z96649 Presence of unspecified artificial hip joint: Secondary | ICD-10-CM | POA: Diagnosis not present

## 2019-07-09 DIAGNOSIS — M47816 Spondylosis without myelopathy or radiculopathy, lumbar region: Secondary | ICD-10-CM | POA: Diagnosis not present

## 2019-07-09 DIAGNOSIS — Z1389 Encounter for screening for other disorder: Secondary | ICD-10-CM | POA: Diagnosis not present

## 2019-07-09 DIAGNOSIS — M5416 Radiculopathy, lumbar region: Secondary | ICD-10-CM | POA: Diagnosis not present

## 2019-07-09 DIAGNOSIS — Z79891 Long term (current) use of opiate analgesic: Secondary | ICD-10-CM | POA: Diagnosis not present

## 2019-07-09 DIAGNOSIS — M5136 Other intervertebral disc degeneration, lumbar region: Secondary | ICD-10-CM | POA: Diagnosis not present

## 2019-07-27 DIAGNOSIS — I129 Hypertensive chronic kidney disease with stage 1 through stage 4 chronic kidney disease, or unspecified chronic kidney disease: Secondary | ICD-10-CM | POA: Diagnosis not present

## 2019-07-27 DIAGNOSIS — D631 Anemia in chronic kidney disease: Secondary | ICD-10-CM | POA: Diagnosis not present

## 2019-07-27 DIAGNOSIS — G8929 Other chronic pain: Secondary | ICD-10-CM | POA: Diagnosis not present

## 2019-07-27 DIAGNOSIS — N183 Chronic kidney disease, stage 3 (moderate): Secondary | ICD-10-CM | POA: Diagnosis not present

## 2019-08-13 DIAGNOSIS — Z981 Arthrodesis status: Secondary | ICD-10-CM | POA: Diagnosis not present

## 2019-08-13 DIAGNOSIS — Z79891 Long term (current) use of opiate analgesic: Secondary | ICD-10-CM | POA: Diagnosis not present

## 2019-08-13 DIAGNOSIS — Z96649 Presence of unspecified artificial hip joint: Secondary | ICD-10-CM | POA: Diagnosis not present

## 2019-08-13 DIAGNOSIS — G894 Chronic pain syndrome: Secondary | ICD-10-CM | POA: Diagnosis not present

## 2019-08-13 DIAGNOSIS — M47816 Spondylosis without myelopathy or radiculopathy, lumbar region: Secondary | ICD-10-CM | POA: Diagnosis not present

## 2019-08-13 DIAGNOSIS — M5030 Other cervical disc degeneration, unspecified cervical region: Secondary | ICD-10-CM | POA: Diagnosis not present

## 2019-08-13 DIAGNOSIS — M5136 Other intervertebral disc degeneration, lumbar region: Secondary | ICD-10-CM | POA: Diagnosis not present

## 2019-08-13 DIAGNOSIS — F1120 Opioid dependence, uncomplicated: Secondary | ICD-10-CM | POA: Diagnosis not present

## 2019-08-13 DIAGNOSIS — M5416 Radiculopathy, lumbar region: Secondary | ICD-10-CM | POA: Diagnosis not present

## 2019-08-13 DIAGNOSIS — Z9889 Other specified postprocedural states: Secondary | ICD-10-CM | POA: Diagnosis not present

## 2019-08-13 DIAGNOSIS — Z1389 Encounter for screening for other disorder: Secondary | ICD-10-CM | POA: Diagnosis not present

## 2019-08-13 DIAGNOSIS — M461 Sacroiliitis, not elsewhere classified: Secondary | ICD-10-CM | POA: Diagnosis not present

## 2019-08-14 DIAGNOSIS — I4891 Unspecified atrial fibrillation: Secondary | ICD-10-CM | POA: Diagnosis not present

## 2019-08-14 DIAGNOSIS — E785 Hyperlipidemia, unspecified: Secondary | ICD-10-CM | POA: Diagnosis not present

## 2019-08-14 DIAGNOSIS — E1165 Type 2 diabetes mellitus with hyperglycemia: Secondary | ICD-10-CM | POA: Diagnosis not present

## 2019-08-14 DIAGNOSIS — N184 Chronic kidney disease, stage 4 (severe): Secondary | ICD-10-CM | POA: Diagnosis not present

## 2019-08-14 DIAGNOSIS — E1140 Type 2 diabetes mellitus with diabetic neuropathy, unspecified: Secondary | ICD-10-CM | POA: Diagnosis not present

## 2019-08-14 DIAGNOSIS — Z713 Dietary counseling and surveillance: Secondary | ICD-10-CM | POA: Diagnosis not present

## 2019-08-14 DIAGNOSIS — I129 Hypertensive chronic kidney disease with stage 1 through stage 4 chronic kidney disease, or unspecified chronic kidney disease: Secondary | ICD-10-CM | POA: Diagnosis not present

## 2019-08-18 DIAGNOSIS — E1140 Type 2 diabetes mellitus with diabetic neuropathy, unspecified: Secondary | ICD-10-CM | POA: Diagnosis not present

## 2019-08-18 DIAGNOSIS — E785 Hyperlipidemia, unspecified: Secondary | ICD-10-CM | POA: Diagnosis not present

## 2019-08-18 DIAGNOSIS — D509 Iron deficiency anemia, unspecified: Secondary | ICD-10-CM | POA: Diagnosis not present

## 2019-08-18 DIAGNOSIS — E039 Hypothyroidism, unspecified: Secondary | ICD-10-CM | POA: Diagnosis not present

## 2019-09-10 DIAGNOSIS — M5136 Other intervertebral disc degeneration, lumbar region: Secondary | ICD-10-CM | POA: Diagnosis not present

## 2019-09-10 DIAGNOSIS — M47816 Spondylosis without myelopathy or radiculopathy, lumbar region: Secondary | ICD-10-CM | POA: Diagnosis not present

## 2019-09-10 DIAGNOSIS — F1120 Opioid dependence, uncomplicated: Secondary | ICD-10-CM | POA: Diagnosis not present

## 2019-09-10 DIAGNOSIS — Z981 Arthrodesis status: Secondary | ICD-10-CM | POA: Diagnosis not present

## 2019-09-10 DIAGNOSIS — M5416 Radiculopathy, lumbar region: Secondary | ICD-10-CM | POA: Diagnosis not present

## 2019-09-10 DIAGNOSIS — M5030 Other cervical disc degeneration, unspecified cervical region: Secondary | ICD-10-CM | POA: Diagnosis not present

## 2019-09-10 DIAGNOSIS — G894 Chronic pain syndrome: Secondary | ICD-10-CM | POA: Diagnosis not present

## 2019-09-10 DIAGNOSIS — Z9889 Other specified postprocedural states: Secondary | ICD-10-CM | POA: Diagnosis not present

## 2019-09-10 DIAGNOSIS — Z1389 Encounter for screening for other disorder: Secondary | ICD-10-CM | POA: Diagnosis not present

## 2019-09-10 DIAGNOSIS — Z79891 Long term (current) use of opiate analgesic: Secondary | ICD-10-CM | POA: Diagnosis not present

## 2019-09-16 ENCOUNTER — Encounter: Payer: Self-pay | Admitting: Cardiology

## 2019-09-16 ENCOUNTER — Ambulatory Visit (INDEPENDENT_AMBULATORY_CARE_PROVIDER_SITE_OTHER): Payer: PPO | Admitting: Cardiology

## 2019-09-16 ENCOUNTER — Other Ambulatory Visit: Payer: Self-pay

## 2019-09-16 VITALS — BP 152/78 | HR 57 | Wt 172.0 lb

## 2019-09-16 DIAGNOSIS — I10 Essential (primary) hypertension: Secondary | ICD-10-CM | POA: Diagnosis not present

## 2019-09-16 DIAGNOSIS — R0989 Other specified symptoms and signs involving the circulatory and respiratory systems: Secondary | ICD-10-CM

## 2019-09-16 DIAGNOSIS — I482 Chronic atrial fibrillation, unspecified: Secondary | ICD-10-CM | POA: Diagnosis not present

## 2019-09-16 NOTE — Patient Instructions (Signed)
Medication Instrctions:  Your physician recommends that you continue on your current medications as directed. Please refer to the Current Medication list given to you today.  If you need a refill on your cardiac medications before your next appointment, please call your pharmacy.   Lab work: None ordered  Testing/Procedures: Your physician has requested that you have a carotid duplex. This test is an ultrasound of the carotid arteries in your neck. It looks at blood flow through these arteries that supply the brain with blood. Allow one hour for this exam. There are no restrictions or special instructions.  Your physician has requested that you have an echocardiogram. Echocardiography is a painless test that uses sound waves to create images of your heart. It provides your doctor with information about the size and shape of your heart and how well your heart's chambers and valves are working. This procedure takes approximately one hour. There are no restrictions for this procedure.    Follow-Up: At [facility], you and your health needs are our priority.  As part of our continuing mission to provide you with exceptional heart care, we have created designated Provider Care Teams.  These Care Teams include your primary Cardiologist (physician) and Advanced Practice Providers (APPs -  Physician Assistants and Nurse Practitioners) who all work together to provide you with the care you need, when you need it. You will need a follow up appointment in 3 months.  Please call our office 2 months in advance to schedule this appointment.  You may see Dr. Agustin Cree or another member of our [place] Provider Team in [place]: Shirlee More, MD . Jyl Heinz, MD  Any Other Special Instructions Will Be Listed Below (If Applicable).   Echocardiogram An echocardiogram is a procedure that uses painless sound waves (ultrasound) to produce an image of the heart. Images from an echocardiogram can  provide important information about:  Signs of coronary artery disease (CAD).  Aneurysm detection. An aneurysm is a weak or damaged part of an artery wall that bulges out from the normal force of blood pumping through the body.  Heart size and shape. Changes in the size or shape of the heart can be associated with certain conditions, including heart failure, aneurysm, and CAD.  Heart muscle function.  Heart valve function.  Signs of a past heart attack.  Fluid buildup around the heart.  Thickening of the heart muscle.  A tumor or infectious growth around the heart valves. Tell a health care provider about:  Any allergies you have.  All medicines you are taking, including vitamins, herbs, eye drops, creams, and over-the-counter medicines.  Any blood disorders you have.  Any surgeries you have had.  Any medical conditions you have.  Whether you are pregnant or may be pregnant. What are the risks? Generally, this is a safe procedure. However, problems may occur, including:  Allergic reaction to dye (contrast) that may be used during the procedure. What happens before the procedure? No specific preparation is needed. You may eat and drink normally. What happens during the procedure?   An IV tube may be inserted into one of your veins.  You may receive contrast through this tube. A contrast is an injection that improves the quality of the pictures from your heart.  A gel will be applied to your chest.  A wand-like tool (transducer) will be moved over your chest. The gel will help to transmit the sound waves from the transducer.  The sound waves will harmlessly bounce  off of your heart to allow the heart images to be captured in real-time motion. The images will be recorded on a computer. The procedure may vary among health care providers and hospitals. What happens after the procedure?  You may return to your normal, everyday life, including diet, activities, and  medicines, unless your health care provider tells you not to do that. Summary  An echocardiogram is a procedure that uses painless sound waves (ultrasound) to produce an image of the heart.  Images from an echocardiogram can provide important information about the size and shape of your heart, heart muscle function, heart valve function, and fluid buildup around your heart.  You do not need to do anything to prepare before this procedure. You may eat and drink normally.  After the echocardiogram is completed, you may return to your normal, everyday life, unless your health care provider tells you not to do that. This information is not intended to replace advice given to you by your health care provider. Make sure you discuss any questions you have with your health care provider. Document Released: 11/30/2000 Document Revised: 03/26/2019 Document Reviewed: 01/05/2017 Elsevier Patient Education  2020 [employer].

## 2019-09-16 NOTE — Addendum Note (Signed)
Addended by: Polly Cobia A on: 09/16/2019 11:11 AM   Modules accepted: Orders

## 2019-09-16 NOTE — Progress Notes (Signed)
Cardiology Office Note:    Date:  09/16/2019   ID:  Robin Brewer, DOB February 03, 1946, MRN [id number]  PCP:  Nicoletta Dress, MD  Cardiologist:  Jenne Campus, MD    Referring MD: Nicoletta Dress, MD   Chief Complaint  Patient presents with  . Follow-up  I am tired  History of Present Illness:    Robin Brewer is a 73 y.o. female with peripheral vascular disease, permanent atrial fibrillation, essential hypertension comes today to my office for follow-up of all seems to be doing fair described to have weakness fatigue and tiredness complain also of memory problem.  Past Medical History:  Diagnosis Date  . Anxiety   . Anxiety state, unspecified 08/17/2013  . Arthritis   . Atrial fibrillation ([place])   . Bronchitis    hx of   . Chronic pain syndrome 08/17/2013  . Depression   . Diabetes ([place]) 08/17/2013  . Diabetes mellitus without complication ([place])   . HTN (hypertension) 08/17/2013  . Hyperlipidemia   . Hypertension   . Hypothyroidism   . Right carotid bruit   . S/P total hip arthroplasty 08/17/2013   Right. 08/12/13  . Shortness of breath    with exertion   . Unspecified hypothyroidism 08/17/2013    Past Surgical History:  Procedure Laterality Date  . ABDOMINAL HYSTERECTOMY    . BACK SURGERY    . NECK SURGERY    . TOTAL HIP ARTHROPLASTY Right 08/12/2013   Procedure: RIGHT TOTAL HIP ARTHROPLASTY ANTERIOR APPROACH;  Surgeon: [name], MD;  Location: [facility];  Service: Orthopedics;  Laterality: Right;    Current Medications: Current Meds  Medication Sig  . albuterol (PROAIR HFA) 108 (90 BASE) MCG/ACT inhaler Inhale 2 puffs into the lungs every 6 (six) hours as needed for wheezing.  Marland Kitchen amLODipine (NORVASC) 2.5 MG tablet Take 1 tablet by mouth daily.   Marland Kitchen ELIQUIS 5 MG TABS tablet Take 1 tablet by mouth 2 (two) times daily.  . furosemide (LASIX) 40 MG tablet Take 1.5 tablets (60 mg) by mouth once daily. (Patient taking differently: Take 40 mg by mouth 2 (two) times  daily. )  . Icosapent Ethyl (VASCEPA) 1 g CAPS Take 1 capsule by mouth 2 (two) times daily.  . insulin aspart (NOVOLOG) 100 UNIT/ML injection Inject 12 Units into the skin 3 (three) times daily with meals.   . insulin detemir (LEVEMIR) 100 UNIT/ML injection Inject 0.3 mLs (30 Units total) into the skin daily. Patient takes in the am (Patient taking differently: Inject 65 Units into the skin daily. Patient takes in the am)  . labetalol (NORMODYNE) 300 MG tablet Take 1 tablet by mouth 2 (two) times daily.  Marland Kitchen levothyroxine (SYNTHROID, LEVOTHROID) 50 MCG tablet Take 50 mcg by mouth daily before breakfast.  . omeprazole (PRILOSEC) 20 MG capsule Take 1 capsule by mouth daily.  . ranolazine (RANEXA) 500 MG 12 hr tablet Take 1 tablet by mouth 2 (two) times daily.  . rosuvastatin (CRESTOR) 40 MG tablet Take 40 mg by mouth every evening.   . sertraline (ZOLOFT) 100 MG tablet Take 100 mg by mouth every evening.     Allergies:   Aspirin and Niacin and related   Social History   Socioeconomic History  . Marital status: Married    Spouse name: Not on file  . Number of children: Not on file  . Years of education: Not on file  . Highest education level: Not on file  Occupational History  .  Not on file  Social Needs  . Financial resource strain: Not on file  . Food insecurity    Worry: Not on file    Inability: Not on file  . Transportation needs    Medical: Not on file    Non-medical: Not on file  Tobacco Use  . Smoking status: Former Smoker    Packs/day: 0.25    Types: Cigarettes  . Smokeless tobacco: Never Used  Substance and Sexual Activity  . Alcohol use: No  . Drug use: No  . Sexual activity: Not Currently  Lifestyle  . Physical activity    Days per week: Not on file    Minutes per session: Not on file  . Stress: Not on file  Relationships  . Social Herbalist on phone: Not on file    Gets together: Not on file    Attends religious service: Not on file    Active  member of club or organization: Not on file    Attends meetings of clubs or organizations: Not on file    Relationship status: Not on file  Other Topics Concern  . Not on file  Social History Narrative  . Not on file     Family History: The patient's family history includes Heart disease in her mother; Transient ischemic attack in her mother. ROS:   Please see the history of present illness.    All 14 point review of systems negative except as described per history of present illness  EKGs/Labs/Other Studies Reviewed:      Recent Labs: 12/16/2018: ALT 15; Hemoglobin 11.4; Platelets 174 01/05/2019: BUN 31; Creatinine, Ser 2.59; Potassium 4.5; Sodium 140  Recent Lipid Panel No results found for: CHOL, TRIG, HDL, CHOLHDL, VLDL, LDLCALC, LDLDIRECT  Physical Exam:    VS:  BP (!) 152/78   Pulse (!) 57   Wt 172 lb (78 kg)   SpO2 98%   BMI 31.46 kg/m     Wt Readings from Last 3 Encounters:  09/16/19 172 lb (78 kg)  04/14/19 175 lb (79.4 kg)  12/16/18 169 lb 12.8 oz (77 kg)     GEN:  Well nourished, well developed in no acute distress HEENT: Normal NECK: No JVD; soft right carotid bruit LYMPHATICS: No lymphadenopathy CARDIAC: RRR, no murmurs, no rubs, no gallops RESPIRATORY:  Clear to auscultation without rales, wheezing or rhonchi  ABDOMEN: Soft, non-tender, non-distended MUSCULOSKELETAL:  No edema; No deformity  SKIN: Warm and dry LOWER EXTREMITIES: no swelling NEUROLOGIC:  Alert and oriented x 3 PSYCHIATRIC:  Normal affect   ASSESSMENT:    1. Atrial fibrillation, chronic   2. Essential hypertension   3. Right carotid bruit    PLAN:    In order of problems listed above:  1. Atrial fibrillation which is chronic.  Continue present management including anticoagulation 2. Essential hypertension blood pressure well controlled continue present management. 3. Carotic bruit we will repeat ultrasounds of her neck.  I will also ask her to have an echocardiogram to  assess left ventricular ejection fraction because of fatigue and tiredness.   Medication Adjustments/Labs and Tests Ordered: Current medicines are reviewed at length with the patient today.  Concerns regarding medicines are outlined above.  No orders of the defined types were placed in this encounter.  Medication changes: No orders of the defined types were placed in this encounter.   Signed, Park Liter, MD, [facility] 09/16/2019 10:47 AM    [place]

## 2019-09-26 DIAGNOSIS — Z23 Encounter for immunization: Secondary | ICD-10-CM | POA: Diagnosis not present

## 2019-10-01 DIAGNOSIS — N189 Chronic kidney disease, unspecified: Secondary | ICD-10-CM | POA: Diagnosis not present

## 2019-10-01 DIAGNOSIS — E1165 Type 2 diabetes mellitus with hyperglycemia: Secondary | ICD-10-CM | POA: Diagnosis not present

## 2019-10-01 DIAGNOSIS — I4891 Unspecified atrial fibrillation: Secondary | ICD-10-CM | POA: Diagnosis not present

## 2019-10-01 DIAGNOSIS — E1140 Type 2 diabetes mellitus with diabetic neuropathy, unspecified: Secondary | ICD-10-CM | POA: Diagnosis not present

## 2019-10-01 DIAGNOSIS — E039 Hypothyroidism, unspecified: Secondary | ICD-10-CM | POA: Diagnosis not present

## 2019-10-01 DIAGNOSIS — E785 Hyperlipidemia, unspecified: Secondary | ICD-10-CM | POA: Diagnosis not present

## 2019-10-01 DIAGNOSIS — E1122 Type 2 diabetes mellitus with diabetic chronic kidney disease: Secondary | ICD-10-CM | POA: Diagnosis not present

## 2019-10-08 DIAGNOSIS — Z9889 Other specified postprocedural states: Secondary | ICD-10-CM | POA: Diagnosis not present

## 2019-10-08 DIAGNOSIS — M5416 Radiculopathy, lumbar region: Secondary | ICD-10-CM | POA: Diagnosis not present

## 2019-10-08 DIAGNOSIS — M47816 Spondylosis without myelopathy or radiculopathy, lumbar region: Secondary | ICD-10-CM | POA: Diagnosis not present

## 2019-10-08 DIAGNOSIS — G894 Chronic pain syndrome: Secondary | ICD-10-CM | POA: Diagnosis not present

## 2019-10-08 DIAGNOSIS — F1120 Opioid dependence, uncomplicated: Secondary | ICD-10-CM | POA: Diagnosis not present

## 2019-10-08 DIAGNOSIS — Z981 Arthrodesis status: Secondary | ICD-10-CM | POA: Diagnosis not present

## 2019-10-08 DIAGNOSIS — M5030 Other cervical disc degeneration, unspecified cervical region: Secondary | ICD-10-CM | POA: Diagnosis not present

## 2019-10-08 DIAGNOSIS — Z1389 Encounter for screening for other disorder: Secondary | ICD-10-CM | POA: Diagnosis not present

## 2019-10-08 DIAGNOSIS — Z79891 Long term (current) use of opiate analgesic: Secondary | ICD-10-CM | POA: Diagnosis not present

## 2019-10-08 DIAGNOSIS — M5136 Other intervertebral disc degeneration, lumbar region: Secondary | ICD-10-CM | POA: Diagnosis not present

## 2019-10-16 DIAGNOSIS — N183 Chronic kidney disease, stage 3 unspecified: Secondary | ICD-10-CM | POA: Diagnosis not present

## 2019-10-16 DIAGNOSIS — I129 Hypertensive chronic kidney disease with stage 1 through stage 4 chronic kidney disease, or unspecified chronic kidney disease: Secondary | ICD-10-CM | POA: Diagnosis not present

## 2019-10-16 DIAGNOSIS — G8929 Other chronic pain: Secondary | ICD-10-CM | POA: Diagnosis not present

## 2019-10-16 DIAGNOSIS — D631 Anemia in chronic kidney disease: Secondary | ICD-10-CM | POA: Diagnosis not present

## 2019-10-20 ENCOUNTER — Other Ambulatory Visit: Payer: PPO

## 2019-10-28 DIAGNOSIS — N183 Chronic kidney disease, stage 3 unspecified: Secondary | ICD-10-CM | POA: Diagnosis not present

## 2019-10-28 DIAGNOSIS — D631 Anemia in chronic kidney disease: Secondary | ICD-10-CM | POA: Diagnosis not present

## 2019-10-29 DIAGNOSIS — N189 Chronic kidney disease, unspecified: Secondary | ICD-10-CM | POA: Diagnosis not present

## 2019-10-29 DIAGNOSIS — D631 Anemia in chronic kidney disease: Secondary | ICD-10-CM | POA: Diagnosis not present

## 2019-11-03 DIAGNOSIS — Z9889 Other specified postprocedural states: Secondary | ICD-10-CM | POA: Diagnosis not present

## 2019-11-03 DIAGNOSIS — F1120 Opioid dependence, uncomplicated: Secondary | ICD-10-CM | POA: Diagnosis not present

## 2019-11-03 DIAGNOSIS — Z1389 Encounter for screening for other disorder: Secondary | ICD-10-CM | POA: Diagnosis not present

## 2019-11-03 DIAGNOSIS — M5030 Other cervical disc degeneration, unspecified cervical region: Secondary | ICD-10-CM | POA: Diagnosis not present

## 2019-11-03 DIAGNOSIS — Z981 Arthrodesis status: Secondary | ICD-10-CM | POA: Diagnosis not present

## 2019-11-03 DIAGNOSIS — Z79891 Long term (current) use of opiate analgesic: Secondary | ICD-10-CM | POA: Diagnosis not present

## 2019-11-03 DIAGNOSIS — G894 Chronic pain syndrome: Secondary | ICD-10-CM | POA: Diagnosis not present

## 2019-11-03 DIAGNOSIS — E669 Obesity, unspecified: Secondary | ICD-10-CM | POA: Diagnosis not present

## 2019-11-03 DIAGNOSIS — M5416 Radiculopathy, lumbar region: Secondary | ICD-10-CM | POA: Diagnosis not present

## 2019-11-03 DIAGNOSIS — M47816 Spondylosis without myelopathy or radiculopathy, lumbar region: Secondary | ICD-10-CM | POA: Diagnosis not present

## 2019-11-03 DIAGNOSIS — M5136 Other intervertebral disc degeneration, lumbar region: Secondary | ICD-10-CM | POA: Diagnosis not present

## 2019-11-11 DIAGNOSIS — N183 Chronic kidney disease, stage 3 unspecified: Secondary | ICD-10-CM | POA: Diagnosis not present

## 2019-11-11 DIAGNOSIS — D631 Anemia in chronic kidney disease: Secondary | ICD-10-CM | POA: Diagnosis not present

## 2019-11-17 DIAGNOSIS — E1142 Type 2 diabetes mellitus with diabetic polyneuropathy: Secondary | ICD-10-CM | POA: Diagnosis not present

## 2019-11-17 DIAGNOSIS — E1165 Type 2 diabetes mellitus with hyperglycemia: Secondary | ICD-10-CM | POA: Diagnosis not present

## 2019-11-17 DIAGNOSIS — Z1231 Encounter for screening mammogram for malignant neoplasm of breast: Secondary | ICD-10-CM | POA: Diagnosis not present

## 2019-11-17 DIAGNOSIS — N184 Chronic kidney disease, stage 4 (severe): Secondary | ICD-10-CM | POA: Diagnosis not present

## 2019-11-17 DIAGNOSIS — E039 Hypothyroidism, unspecified: Secondary | ICD-10-CM | POA: Diagnosis not present

## 2019-11-17 DIAGNOSIS — I4891 Unspecified atrial fibrillation: Secondary | ICD-10-CM | POA: Diagnosis not present

## 2019-11-17 DIAGNOSIS — E785 Hyperlipidemia, unspecified: Secondary | ICD-10-CM | POA: Diagnosis not present

## 2019-11-17 DIAGNOSIS — I129 Hypertensive chronic kidney disease with stage 1 through stage 4 chronic kidney disease, or unspecified chronic kidney disease: Secondary | ICD-10-CM | POA: Diagnosis not present

## 2019-11-17 DIAGNOSIS — D638 Anemia in other chronic diseases classified elsewhere: Secondary | ICD-10-CM | POA: Diagnosis not present

## 2019-11-19 DIAGNOSIS — N184 Chronic kidney disease, stage 4 (severe): Secondary | ICD-10-CM | POA: Diagnosis not present

## 2019-11-19 DIAGNOSIS — E039 Hypothyroidism, unspecified: Secondary | ICD-10-CM | POA: Diagnosis not present

## 2019-11-19 DIAGNOSIS — E1142 Type 2 diabetes mellitus with diabetic polyneuropathy: Secondary | ICD-10-CM | POA: Diagnosis not present

## 2019-11-19 DIAGNOSIS — E785 Hyperlipidemia, unspecified: Secondary | ICD-10-CM | POA: Diagnosis not present

## 2019-11-19 DIAGNOSIS — D638 Anemia in other chronic diseases classified elsewhere: Secondary | ICD-10-CM | POA: Diagnosis not present

## 2019-11-24 DIAGNOSIS — E785 Hyperlipidemia, unspecified: Secondary | ICD-10-CM | POA: Diagnosis not present

## 2019-11-24 DIAGNOSIS — Z1331 Encounter for screening for depression: Secondary | ICD-10-CM | POA: Diagnosis not present

## 2019-11-24 DIAGNOSIS — Z139 Encounter for screening, unspecified: Secondary | ICD-10-CM | POA: Diagnosis not present

## 2019-11-24 DIAGNOSIS — Z1231 Encounter for screening mammogram for malignant neoplasm of breast: Secondary | ICD-10-CM | POA: Diagnosis not present

## 2019-11-24 DIAGNOSIS — Z9181 History of falling: Secondary | ICD-10-CM | POA: Diagnosis not present

## 2019-11-24 DIAGNOSIS — Z6830 Body mass index (BMI) 30.0-30.9, adult: Secondary | ICD-10-CM | POA: Diagnosis not present

## 2019-11-24 DIAGNOSIS — Z0000 Encounter for general adult medical examination without abnormal findings: Secondary | ICD-10-CM | POA: Diagnosis not present

## 2019-11-25 DIAGNOSIS — N183 Chronic kidney disease, stage 3 unspecified: Secondary | ICD-10-CM | POA: Diagnosis not present

## 2019-11-25 DIAGNOSIS — D631 Anemia in chronic kidney disease: Secondary | ICD-10-CM | POA: Diagnosis not present

## 2019-12-04 DIAGNOSIS — D631 Anemia in chronic kidney disease: Secondary | ICD-10-CM | POA: Diagnosis not present

## 2019-12-04 DIAGNOSIS — N2581 Secondary hyperparathyroidism of renal origin: Secondary | ICD-10-CM | POA: Diagnosis not present

## 2019-12-04 DIAGNOSIS — G8929 Other chronic pain: Secondary | ICD-10-CM | POA: Diagnosis not present

## 2019-12-04 DIAGNOSIS — I129 Hypertensive chronic kidney disease with stage 1 through stage 4 chronic kidney disease, or unspecified chronic kidney disease: Secondary | ICD-10-CM | POA: Diagnosis not present

## 2019-12-04 DIAGNOSIS — N183 Chronic kidney disease, stage 3 unspecified: Secondary | ICD-10-CM | POA: Diagnosis not present

## 2019-12-07 DIAGNOSIS — M5030 Other cervical disc degeneration, unspecified cervical region: Secondary | ICD-10-CM | POA: Diagnosis not present

## 2019-12-07 DIAGNOSIS — G894 Chronic pain syndrome: Secondary | ICD-10-CM | POA: Diagnosis not present

## 2019-12-07 DIAGNOSIS — M47816 Spondylosis without myelopathy or radiculopathy, lumbar region: Secondary | ICD-10-CM | POA: Diagnosis not present

## 2019-12-07 DIAGNOSIS — M5416 Radiculopathy, lumbar region: Secondary | ICD-10-CM | POA: Diagnosis not present

## 2019-12-07 DIAGNOSIS — M5136 Other intervertebral disc degeneration, lumbar region: Secondary | ICD-10-CM | POA: Diagnosis not present

## 2019-12-07 DIAGNOSIS — Z981 Arthrodesis status: Secondary | ICD-10-CM | POA: Diagnosis not present

## 2019-12-07 DIAGNOSIS — F1120 Opioid dependence, uncomplicated: Secondary | ICD-10-CM | POA: Diagnosis not present

## 2019-12-07 DIAGNOSIS — Z79891 Long term (current) use of opiate analgesic: Secondary | ICD-10-CM | POA: Diagnosis not present

## 2019-12-07 DIAGNOSIS — E669 Obesity, unspecified: Secondary | ICD-10-CM | POA: Diagnosis not present

## 2019-12-07 DIAGNOSIS — Z9889 Other specified postprocedural states: Secondary | ICD-10-CM | POA: Diagnosis not present

## 2019-12-08 ENCOUNTER — Ambulatory Visit: Payer: PPO | Admitting: Cardiology

## 2019-12-09 DIAGNOSIS — N183 Chronic kidney disease, stage 3 unspecified: Secondary | ICD-10-CM | POA: Diagnosis not present

## 2019-12-09 DIAGNOSIS — D631 Anemia in chronic kidney disease: Secondary | ICD-10-CM | POA: Diagnosis not present

## 2019-12-23 DIAGNOSIS — N183 Chronic kidney disease, stage 3 unspecified: Secondary | ICD-10-CM | POA: Diagnosis not present

## 2019-12-23 DIAGNOSIS — D631 Anemia in chronic kidney disease: Secondary | ICD-10-CM | POA: Diagnosis not present

## 2019-12-29 ENCOUNTER — Encounter: Payer: Self-pay | Admitting: Cardiology

## 2019-12-29 ENCOUNTER — Ambulatory Visit (INDEPENDENT_AMBULATORY_CARE_PROVIDER_SITE_OTHER): Payer: PPO | Admitting: Cardiology

## 2019-12-29 ENCOUNTER — Other Ambulatory Visit: Payer: Self-pay

## 2019-12-29 VITALS — BP 186/80 | HR 69 | Ht 62.0 in | Wt 171.0 lb

## 2019-12-29 DIAGNOSIS — I482 Chronic atrial fibrillation, unspecified: Secondary | ICD-10-CM | POA: Diagnosis not present

## 2019-12-29 DIAGNOSIS — E782 Mixed hyperlipidemia: Secondary | ICD-10-CM

## 2019-12-29 DIAGNOSIS — I10 Essential (primary) hypertension: Secondary | ICD-10-CM | POA: Diagnosis not present

## 2019-12-29 DIAGNOSIS — R0989 Other specified symptoms and signs involving the circulatory and respiratory systems: Secondary | ICD-10-CM

## 2019-12-29 NOTE — Progress Notes (Signed)
Cardiology Office Note:    Date:  12/29/2019   ID:  Robin Brewer, DOB 1946/11/05, MRN [id number]  PCP:  Nicoletta Dress, MD  Cardiologist:  Jenne Campus, MD    Referring MD: Nicoletta Dress, MD   Chief Complaint  Patient presents with  . Follow-up  Doing well  History of Present Illness:    Robin Brewer is a 74 y.o. female with complex past medical history which includes permanent atrial fibrillation, COPD, essential hypertension, diabetes, peripheral vascular disease.  Comes today to my office for follow-up.  Overall doing well.  She is very busy seeing different doctors I offer her again because echocardiogram as well as carotid ultrasounds but however she says she is too busy to do it now.  She was scheduled in September but she did not follow-up.  Does not smoke doing well overall complain of being tired and exhausted but nothing extraordinary.  Past Medical History:  Diagnosis Date  . Anxiety   . Anxiety state, unspecified 08/17/2013  . Arthritis   . Atrial fibrillation ([place])   . Bronchitis    hx of   . Chronic pain syndrome 08/17/2013  . Depression   . Diabetes ([place]) 08/17/2013  . Diabetes mellitus without complication ([place])   . HTN (hypertension) 08/17/2013  . Hyperlipidemia   . Hypertension   . Hypothyroidism   . Right carotid bruit   . S/P total hip arthroplasty 08/17/2013   Right. 08/12/13  . Shortness of breath    with exertion   . Unspecified hypothyroidism 08/17/2013    Past Surgical History:  Procedure Laterality Date  . ABDOMINAL HYSTERECTOMY    . BACK SURGERY    . NECK SURGERY    . TOTAL HIP ARTHROPLASTY Right 08/12/2013   Procedure: RIGHT TOTAL HIP ARTHROPLASTY ANTERIOR APPROACH;  Surgeon: [name], MD;  Location: [facility];  Service: Orthopedics;  Laterality: Right;    Current Medications: Current Meds  Medication Sig  . albuterol (PROAIR HFA) 108 (90 BASE) MCG/ACT inhaler Inhale 2 puffs into the lungs every 6 (six) hours as needed for  wheezing.  Marland Kitchen amLODipine (NORVASC) 2.5 MG tablet Take 1 tablet by mouth daily.   Marland Kitchen ELIQUIS 5 MG TABS tablet Take 1 tablet by mouth 2 (two) times daily.  . furosemide (LASIX) 40 MG tablet Take 1.5 tablets (60 mg) by mouth once daily. (Patient taking differently: 40 mg 2 (two) times daily. )  . Icosapent Ethyl (VASCEPA) 1 g CAPS Take 1 capsule by mouth 2 (two) times daily.  . insulin aspart (NOVOLOG) 100 UNIT/ML injection Inject 12 Units into the skin 3 (three) times daily with meals.   . insulin detemir (LEVEMIR) 100 UNIT/ML injection Inject 0.3 mLs (30 Units total) into the skin daily. Patient takes in the am (Patient taking differently: Inject 65 Units into the skin daily. Patient takes in the am)  . labetalol (NORMODYNE) 300 MG tablet Take 1 tablet by mouth 2 (two) times daily.  Marland Kitchen levothyroxine (SYNTHROID, LEVOTHROID) 50 MCG tablet Take 50 mcg by mouth daily before breakfast.  . mirtazapine (REMERON) 7.5 MG tablet Take 7.5 mg by mouth at bedtime.  Marland Kitchen omega-3 acid ethyl esters (LOVAZA) 1 g capsule Take by mouth 2 (two) times daily.  Marland Kitchen omeprazole (PRILOSEC) 20 MG capsule Take 1 capsule by mouth daily.  Marland Kitchen oxyCODONE-acetaminophen (PERCOCET) 7.5-325 MG tablet TAKE ONE TABLET EVERY 8 HOURS AS NEEDED FOR CHRONIC PAIN  . ranolazine (RANEXA) 500 MG 12 hr tablet Take 1  tablet by mouth 2 (two) times daily.  . rosuvastatin (CRESTOR) 40 MG tablet Take 40 mg by mouth every evening.   . sertraline (ZOLOFT) 100 MG tablet Take 100 mg by mouth every evening.     Allergies:   Aspirin and Niacin and related   Social History   Socioeconomic History  . Marital status: Married    Spouse name: Not on file  . Number of children: Not on file  . Years of education: Not on file  . Highest education level: Not on file  Occupational History  . Not on file  Tobacco Use  . Smoking status: Former Smoker    Packs/day: 0.25    Types: Cigarettes  . Smokeless tobacco: Never Used  Substance and Sexual Activity  .  Alcohol use: No  . Drug use: No  . Sexual activity: Not Currently  Other Topics Concern  . Not on file  Social History Narrative  . Not on file   Social Determinants of Health   Financial Resource Strain:   . Difficulty of Paying Living Expenses: Not on file  Food Insecurity:   . Worried About Charity fundraiser in the Last Year: Not on file  . Ran Out of Food in the Last Year: Not on file  Transportation Needs:   . Lack of Transportation (Medical): Not on file  . Lack of Transportation (Non-Medical): Not on file  Physical Activity:   . Days of Exercise per Week: Not on file  . Minutes of Exercise per Session: Not on file  Stress:   . Feeling of Stress : Not on file  Social Connections:   . Frequency of Communication with Friends and Family: Not on file  . Frequency of Social Gatherings with Friends and Family: Not on file  . Attends Religious Services: Not on file  . Active Member of Clubs or Organizations: Not on file  . Attends Archivist Meetings: Not on file  . Marital Status: Not on file     Family History: The patient's family history includes Heart disease in her mother; Transient ischemic attack in her mother. ROS:   Please see the history of present illness.    All 14 point review of systems negative except as described per history of present illness  EKGs/Labs/Other Studies Reviewed:      Recent Labs: 01/05/2019: BUN 31; Creatinine, Ser 2.59; Potassium 4.5; Sodium 140  Recent Lipid Panel No results found for: CHOL, TRIG, HDL, CHOLHDL, VLDL, LDLCALC, LDLDIRECT  Physical Exam:    VS:  BP (!) 186/80   Pulse 69   Ht $R'5\' 2"'Rs$  (1.575 m)   Wt 171 lb (77.6 kg)   SpO2 95%   BMI 31.28 kg/m     Wt Readings from Last 3 Encounters:  12/29/19 171 lb (77.6 kg)  09/16/19 172 lb (78 kg)  04/14/19 175 lb (79.4 kg)     GEN:  Well nourished, well developed in no acute distress HEENT: Normal NECK: No JVD; No carotid bruits LYMPHATICS: No  lymphadenopathy CARDIAC: RRR, no murmurs, no rubs, no gallops RESPIRATORY:  Clear to auscultation without rales, wheezing or rhonchi  ABDOMEN: Soft, non-tender, non-distended MUSCULOSKELETAL:  No edema; No deformity  SKIN: Warm and dry LOWER EXTREMITIES: no swelling NEUROLOGIC:  Alert and oriented x 3 PSYCHIATRIC:  Normal affect   ASSESSMENT:    1. Atrial fibrillation, chronic ([place])   2. Essential hypertension   3. Right carotid bruit   4. Mixed hyperlipidemia  PLAN:    In order of problems listed above:  1. Atrial fibrillation chronic rate control anticoagulation which I will continue.  She is on Eliquis 5 mg twice daily 2. Essential hypertension blood pressure controlled continue present management. 3. Carotid bruit with peripheral vascular disease.  Again she was asked to have carotid ultrasounds but she said she wants to wait at least 3 months before do it 4. Dyslipidemia she is on high intensity statin which I will continue.  Will call primary care physician to get her fasting lipid profile.   Medication Adjustments/Labs and Tests Ordered: Current medicines are reviewed at length with the patient today.  Concerns regarding medicines are outlined above.  No orders of the defined types were placed in this encounter.  Medication changes: No orders of the defined types were placed in this encounter.   Signed, Park Liter, MD, [facility] 12/29/2019 10:57 AM    [place]

## 2019-12-29 NOTE — Patient Instructions (Signed)

## 2020-01-04 DIAGNOSIS — F1120 Opioid dependence, uncomplicated: Secondary | ICD-10-CM | POA: Diagnosis not present

## 2020-01-04 DIAGNOSIS — Z9889 Other specified postprocedural states: Secondary | ICD-10-CM | POA: Diagnosis not present

## 2020-01-04 DIAGNOSIS — G894 Chronic pain syndrome: Secondary | ICD-10-CM | POA: Diagnosis not present

## 2020-01-04 DIAGNOSIS — M5136 Other intervertebral disc degeneration, lumbar region: Secondary | ICD-10-CM | POA: Diagnosis not present

## 2020-01-04 DIAGNOSIS — E669 Obesity, unspecified: Secondary | ICD-10-CM | POA: Diagnosis not present

## 2020-01-04 DIAGNOSIS — M5416 Radiculopathy, lumbar region: Secondary | ICD-10-CM | POA: Diagnosis not present

## 2020-01-04 DIAGNOSIS — Z981 Arthrodesis status: Secondary | ICD-10-CM | POA: Diagnosis not present

## 2020-01-04 DIAGNOSIS — Z79891 Long term (current) use of opiate analgesic: Secondary | ICD-10-CM | POA: Diagnosis not present

## 2020-01-04 DIAGNOSIS — M47816 Spondylosis without myelopathy or radiculopathy, lumbar region: Secondary | ICD-10-CM | POA: Diagnosis not present

## 2020-01-04 DIAGNOSIS — M5030 Other cervical disc degeneration, unspecified cervical region: Secondary | ICD-10-CM | POA: Diagnosis not present

## 2020-01-06 DIAGNOSIS — N183 Chronic kidney disease, stage 3 unspecified: Secondary | ICD-10-CM | POA: Diagnosis not present

## 2020-01-06 DIAGNOSIS — D631 Anemia in chronic kidney disease: Secondary | ICD-10-CM | POA: Diagnosis not present

## 2020-01-19 DIAGNOSIS — Z1231 Encounter for screening mammogram for malignant neoplasm of breast: Secondary | ICD-10-CM | POA: Diagnosis not present

## 2020-01-21 DIAGNOSIS — N183 Chronic kidney disease, stage 3 unspecified: Secondary | ICD-10-CM | POA: Diagnosis not present

## 2020-01-21 DIAGNOSIS — D631 Anemia in chronic kidney disease: Secondary | ICD-10-CM | POA: Diagnosis not present

## 2020-02-05 DIAGNOSIS — D631 Anemia in chronic kidney disease: Secondary | ICD-10-CM | POA: Diagnosis not present

## 2020-02-05 DIAGNOSIS — N183 Chronic kidney disease, stage 3 unspecified: Secondary | ICD-10-CM | POA: Diagnosis not present

## 2020-02-15 DIAGNOSIS — R55 Syncope and collapse: Secondary | ICD-10-CM | POA: Diagnosis not present

## 2020-02-15 DIAGNOSIS — S299XXA Unspecified injury of thorax, initial encounter: Secondary | ICD-10-CM | POA: Diagnosis not present

## 2020-02-15 DIAGNOSIS — S3992XA Unspecified injury of lower back, initial encounter: Secondary | ICD-10-CM | POA: Diagnosis not present

## 2020-02-15 DIAGNOSIS — S0101XA Laceration without foreign body of scalp, initial encounter: Secondary | ICD-10-CM | POA: Diagnosis not present

## 2020-02-15 DIAGNOSIS — S134XXA Sprain of ligaments of cervical spine, initial encounter: Secondary | ICD-10-CM | POA: Diagnosis not present

## 2020-02-19 DIAGNOSIS — D631 Anemia in chronic kidney disease: Secondary | ICD-10-CM | POA: Diagnosis not present

## 2020-02-19 DIAGNOSIS — N183 Chronic kidney disease, stage 3 unspecified: Secondary | ICD-10-CM | POA: Diagnosis not present

## 2020-02-22 DIAGNOSIS — M5030 Other cervical disc degeneration, unspecified cervical region: Secondary | ICD-10-CM | POA: Diagnosis not present

## 2020-02-22 DIAGNOSIS — M47816 Spondylosis without myelopathy or radiculopathy, lumbar region: Secondary | ICD-10-CM | POA: Diagnosis not present

## 2020-02-22 DIAGNOSIS — E669 Obesity, unspecified: Secondary | ICD-10-CM | POA: Diagnosis not present

## 2020-02-22 DIAGNOSIS — G894 Chronic pain syndrome: Secondary | ICD-10-CM | POA: Diagnosis not present

## 2020-02-22 DIAGNOSIS — Z79891 Long term (current) use of opiate analgesic: Secondary | ICD-10-CM | POA: Diagnosis not present

## 2020-02-22 DIAGNOSIS — Z981 Arthrodesis status: Secondary | ICD-10-CM | POA: Diagnosis not present

## 2020-02-22 DIAGNOSIS — Z9889 Other specified postprocedural states: Secondary | ICD-10-CM | POA: Diagnosis not present

## 2020-02-22 DIAGNOSIS — M5416 Radiculopathy, lumbar region: Secondary | ICD-10-CM | POA: Diagnosis not present

## 2020-02-22 DIAGNOSIS — M5136 Other intervertebral disc degeneration, lumbar region: Secondary | ICD-10-CM | POA: Diagnosis not present

## 2020-02-22 DIAGNOSIS — F1120 Opioid dependence, uncomplicated: Secondary | ICD-10-CM | POA: Diagnosis not present

## 2020-02-25 DIAGNOSIS — G8911 Acute pain due to trauma: Secondary | ICD-10-CM | POA: Diagnosis not present

## 2020-02-25 DIAGNOSIS — M542 Cervicalgia: Secondary | ICD-10-CM | POA: Diagnosis not present

## 2020-02-25 DIAGNOSIS — M545 Low back pain: Secondary | ICD-10-CM | POA: Diagnosis not present

## 2020-02-25 DIAGNOSIS — S098XXA Other specified injuries of head, initial encounter: Secondary | ICD-10-CM | POA: Diagnosis not present

## 2020-02-25 DIAGNOSIS — S0101XA Laceration without foreign body of scalp, initial encounter: Secondary | ICD-10-CM | POA: Diagnosis not present

## 2020-02-26 DIAGNOSIS — N183 Chronic kidney disease, stage 3 unspecified: Secondary | ICD-10-CM | POA: Diagnosis not present

## 2020-02-26 DIAGNOSIS — D631 Anemia in chronic kidney disease: Secondary | ICD-10-CM | POA: Diagnosis not present

## 2020-03-03 DIAGNOSIS — N2581 Secondary hyperparathyroidism of renal origin: Secondary | ICD-10-CM | POA: Diagnosis not present

## 2020-03-03 DIAGNOSIS — D631 Anemia in chronic kidney disease: Secondary | ICD-10-CM | POA: Diagnosis not present

## 2020-03-03 DIAGNOSIS — G8929 Other chronic pain: Secondary | ICD-10-CM | POA: Diagnosis not present

## 2020-03-03 DIAGNOSIS — N183 Chronic kidney disease, stage 3 unspecified: Secondary | ICD-10-CM | POA: Diagnosis not present

## 2020-03-03 DIAGNOSIS — I129 Hypertensive chronic kidney disease with stage 1 through stage 4 chronic kidney disease, or unspecified chronic kidney disease: Secondary | ICD-10-CM | POA: Diagnosis not present

## 2020-03-11 DIAGNOSIS — D631 Anemia in chronic kidney disease: Secondary | ICD-10-CM | POA: Diagnosis not present

## 2020-03-11 DIAGNOSIS — N183 Chronic kidney disease, stage 3 unspecified: Secondary | ICD-10-CM | POA: Diagnosis not present

## 2020-03-21 DIAGNOSIS — M5136 Other intervertebral disc degeneration, lumbar region: Secondary | ICD-10-CM | POA: Diagnosis not present

## 2020-03-21 DIAGNOSIS — F1120 Opioid dependence, uncomplicated: Secondary | ICD-10-CM | POA: Diagnosis not present

## 2020-03-21 DIAGNOSIS — Z79891 Long term (current) use of opiate analgesic: Secondary | ICD-10-CM | POA: Diagnosis not present

## 2020-03-21 DIAGNOSIS — Z981 Arthrodesis status: Secondary | ICD-10-CM | POA: Diagnosis not present

## 2020-03-21 DIAGNOSIS — G894 Chronic pain syndrome: Secondary | ICD-10-CM | POA: Diagnosis not present

## 2020-03-21 DIAGNOSIS — M47816 Spondylosis without myelopathy or radiculopathy, lumbar region: Secondary | ICD-10-CM | POA: Diagnosis not present

## 2020-03-21 DIAGNOSIS — M5030 Other cervical disc degeneration, unspecified cervical region: Secondary | ICD-10-CM | POA: Diagnosis not present

## 2020-03-21 DIAGNOSIS — E669 Obesity, unspecified: Secondary | ICD-10-CM | POA: Diagnosis not present

## 2020-03-21 DIAGNOSIS — Z9889 Other specified postprocedural states: Secondary | ICD-10-CM | POA: Diagnosis not present

## 2020-03-25 DIAGNOSIS — D631 Anemia in chronic kidney disease: Secondary | ICD-10-CM | POA: Diagnosis not present

## 2020-03-25 DIAGNOSIS — N183 Chronic kidney disease, stage 3 unspecified: Secondary | ICD-10-CM | POA: Diagnosis not present

## 2020-04-07 DIAGNOSIS — D638 Anemia in other chronic diseases classified elsewhere: Secondary | ICD-10-CM | POA: Diagnosis not present

## 2020-04-07 DIAGNOSIS — N184 Chronic kidney disease, stage 4 (severe): Secondary | ICD-10-CM | POA: Diagnosis not present

## 2020-04-07 DIAGNOSIS — E785 Hyperlipidemia, unspecified: Secondary | ICD-10-CM | POA: Diagnosis not present

## 2020-04-07 DIAGNOSIS — E1142 Type 2 diabetes mellitus with diabetic polyneuropathy: Secondary | ICD-10-CM | POA: Diagnosis not present

## 2020-04-07 DIAGNOSIS — I129 Hypertensive chronic kidney disease with stage 1 through stage 4 chronic kidney disease, or unspecified chronic kidney disease: Secondary | ICD-10-CM | POA: Diagnosis not present

## 2020-04-07 DIAGNOSIS — E039 Hypothyroidism, unspecified: Secondary | ICD-10-CM | POA: Diagnosis not present

## 2020-04-07 DIAGNOSIS — E1165 Type 2 diabetes mellitus with hyperglycemia: Secondary | ICD-10-CM | POA: Diagnosis not present

## 2020-04-07 DIAGNOSIS — I4891 Unspecified atrial fibrillation: Secondary | ICD-10-CM | POA: Diagnosis not present

## 2020-04-08 DIAGNOSIS — N183 Chronic kidney disease, stage 3 unspecified: Secondary | ICD-10-CM | POA: Diagnosis not present

## 2020-04-08 DIAGNOSIS — D631 Anemia in chronic kidney disease: Secondary | ICD-10-CM | POA: Diagnosis not present

## 2020-04-18 DIAGNOSIS — Z79891 Long term (current) use of opiate analgesic: Secondary | ICD-10-CM | POA: Diagnosis not present

## 2020-04-18 DIAGNOSIS — F1120 Opioid dependence, uncomplicated: Secondary | ICD-10-CM | POA: Diagnosis not present

## 2020-04-18 DIAGNOSIS — G894 Chronic pain syndrome: Secondary | ICD-10-CM | POA: Diagnosis not present

## 2020-04-18 DIAGNOSIS — M5030 Other cervical disc degeneration, unspecified cervical region: Secondary | ICD-10-CM | POA: Diagnosis not present

## 2020-04-18 DIAGNOSIS — Z981 Arthrodesis status: Secondary | ICD-10-CM | POA: Diagnosis not present

## 2020-04-18 DIAGNOSIS — E669 Obesity, unspecified: Secondary | ICD-10-CM | POA: Diagnosis not present

## 2020-04-18 DIAGNOSIS — M47816 Spondylosis without myelopathy or radiculopathy, lumbar region: Secondary | ICD-10-CM | POA: Diagnosis not present

## 2020-04-18 DIAGNOSIS — Z9889 Other specified postprocedural states: Secondary | ICD-10-CM | POA: Diagnosis not present

## 2020-04-18 DIAGNOSIS — M545 Low back pain: Secondary | ICD-10-CM | POA: Diagnosis not present

## 2020-04-18 DIAGNOSIS — M542 Cervicalgia: Secondary | ICD-10-CM | POA: Diagnosis not present

## 2020-04-18 DIAGNOSIS — M5136 Other intervertebral disc degeneration, lumbar region: Secondary | ICD-10-CM | POA: Diagnosis not present

## 2020-04-19 ENCOUNTER — Encounter: Payer: Self-pay | Admitting: Cardiology

## 2020-04-19 ENCOUNTER — Other Ambulatory Visit: Payer: Self-pay

## 2020-04-19 ENCOUNTER — Ambulatory Visit: Payer: PPO | Admitting: Cardiology

## 2020-04-19 VITALS — BP 110/72 | HR 54 | Ht 62.0 in | Wt 170.0 lb

## 2020-04-19 DIAGNOSIS — I480 Paroxysmal atrial fibrillation: Secondary | ICD-10-CM

## 2020-04-19 DIAGNOSIS — Z794 Long term (current) use of insulin: Secondary | ICD-10-CM

## 2020-04-19 DIAGNOSIS — E1129 Type 2 diabetes mellitus with other diabetic kidney complication: Secondary | ICD-10-CM | POA: Diagnosis not present

## 2020-04-19 DIAGNOSIS — E782 Mixed hyperlipidemia: Secondary | ICD-10-CM

## 2020-04-19 DIAGNOSIS — R809 Proteinuria, unspecified: Secondary | ICD-10-CM | POA: Diagnosis not present

## 2020-04-19 DIAGNOSIS — I482 Chronic atrial fibrillation, unspecified: Secondary | ICD-10-CM | POA: Diagnosis not present

## 2020-04-19 DIAGNOSIS — I10 Essential (primary) hypertension: Secondary | ICD-10-CM | POA: Diagnosis not present

## 2020-04-19 HISTORY — DX: Paroxysmal atrial fibrillation: I48.0

## 2020-04-19 NOTE — Progress Notes (Signed)
Cardiology Office Note:    Date:  04/19/2020   ID:  Robin Brewer, DOB Jan 26, 1946, MRN [id number]  PCP:  Nicoletta Dress, MD  Cardiologist:  Jenne Campus, MD    Referring MD: Nicoletta Dress, MD   No chief complaint on file. Complaint bilateral  History of Present Illness:    Robin Brewer is a 74 y.o. female past medical history significant for atrial fibrillation which was diagnosed permanent however today she is in sinus rhythm, some of them with paroxysmal atrial fibrillation, anticoagulated, COPD, essential hypertension, diabetes, peripheral vascular disease, chronic kidney failure with creatinine between 3 and 4.  She comes today 2 months of follow-up.  Cardiac wise seems to be doing fine, however, a few months ago she ended going to the hospital after she sustained a fall.  She said when her sugar goes down she starts shaking and is exactly what happened she had up falling down and hitting her head on the counter.  She required multiple stitches multiple test has been done likely there was no permanent injury.  There was no intracranial bleed in spite of the fact she is anticoagulated. Cardiac wise seems to be doing fine complain of being tired and exhausted but no chest pain tightness squeezing pressure burning chest  Past Medical History:  Diagnosis Date  . Anxiety   . Anxiety state, unspecified 08/17/2013  . Arthritis   . Atrial fibrillation ([place])   . Bronchitis    hx of   . Chronic pain syndrome 08/17/2013  . Depression   . Diabetes ([place]) 08/17/2013  . Diabetes mellitus without complication ([place])   . HTN (hypertension) 08/17/2013  . Hyperlipidemia   . Hypertension   . Hypothyroidism   . Right carotid bruit   . S/P total hip arthroplasty 08/17/2013   Right. 08/12/13  . Shortness of breath    with exertion   . Unspecified hypothyroidism 08/17/2013    Past Surgical History:  Procedure Laterality Date  . ABDOMINAL HYSTERECTOMY    . BACK SURGERY    . NECK SURGERY     . TOTAL HIP ARTHROPLASTY Right 08/12/2013   Procedure: RIGHT TOTAL HIP ARTHROPLASTY ANTERIOR APPROACH;  Surgeon: [name], MD;  Location: [facility];  Service: Orthopedics;  Laterality: Right;    Current Medications: Current Meds  Medication Sig  . albuterol (PROAIR HFA) 108 (90 BASE) MCG/ACT inhaler Inhale 2 puffs into the lungs every 6 (six) hours as needed for wheezing.  Marland Kitchen amLODipine (NORVASC) 2.5 MG tablet Take 1 tablet by mouth daily.   . calcitRIOL (ROCALTROL) 0.25 MCG capsule Take 0.25 mcg by mouth 2 (two) times daily.  Marland Kitchen ELIQUIS 5 MG TABS tablet Take 1 tablet by mouth 2 (two) times daily.  . furosemide (LASIX) 80 MG tablet Take 160 mg by mouth daily.  . insulin aspart (NOVOLOG) 100 UNIT/ML injection Inject 12 Units into the skin 3 (three) times daily with meals.   Marland Kitchen labetalol (NORMODYNE) 300 MG tablet Take 1 tablet by mouth 2 (two) times daily.  Marland Kitchen LEVEMIR FLEXTOUCH 100 UNIT/ML FlexPen SMARTSIG:65 Unit(s) SUB-Q Every Night  . levothyroxine (SYNTHROID, LEVOTHROID) 50 MCG tablet Take 50 mcg by mouth daily before breakfast.  . mirtazapine (REMERON) 7.5 MG tablet Take 7.5 mg by mouth at bedtime.  Marland Kitchen omega-3 acid ethyl esters (LOVAZA) 1 g capsule Take by mouth 2 (two) times daily.  Marland Kitchen omeprazole (PRILOSEC) 20 MG capsule Take 1 capsule by mouth daily.  Marland Kitchen oxyCODONE-acetaminophen (PERCOCET/ROXICET) 5-325 MG tablet  Take 1 tablet by mouth 2 (two) times daily as needed.  . ranolazine (RANEXA) 500 MG 12 hr tablet Take 1 tablet by mouth 2 (two) times daily.  . rosuvastatin (CRESTOR) 40 MG tablet Take 40 mg by mouth every evening.   . sertraline (ZOLOFT) 100 MG tablet Take 100 mg by mouth every evening.     Allergies:   Aspirin and Niacin and related   Social History   Socioeconomic History  . Marital status: Married    Spouse name: Not on file  . Number of children: Not on file  . Years of education: Not on file  . Highest education level: Not on file  Occupational History  . Not on  file  Tobacco Use  . Smoking status: Former Smoker    Packs/day: 0.25    Types: Cigarettes  . Smokeless tobacco: Never Used  Substance and Sexual Activity  . Alcohol use: No  . Drug use: No  . Sexual activity: Not Currently  Other Topics Concern  . Not on file  Social History Narrative  . Not on file   Social Determinants of Health   Financial Resource Strain:   . Difficulty of Paying Living Expenses:   Food Insecurity:   . Worried About Charity fundraiser in the Last Year:   . Arboriculturist in the Last Year:   Transportation Needs:   . Film/video editor (Medical):   Marland Kitchen Lack of Transportation (Non-Medical):   Physical Activity:   . Days of Exercise per Week:   . Minutes of Exercise per Session:   Stress:   . Feeling of Stress :   Social Connections:   . Frequency of Communication with Friends and Family:   . Frequency of Social Gatherings with Friends and Family:   . Attends Religious Services:   . Active Member of Clubs or Organizations:   . Attends Archivist Meetings:   Marland Kitchen Marital Status:      Family History: The patient's family history includes Heart disease in her mother; Transient ischemic attack in her mother. ROS:   Please see the history of present illness.    All 14 point review of systems negative except as described per history of present illness  EKGs/Labs/Other Studies Reviewed:    I did review emergency room record for this visit Recent Labs: No results found for requested labs within last 8760 hours.  Recent Lipid Panel No results found for: CHOL, TRIG, HDL, CHOLHDL, VLDL, LDLCALC, LDLDIRECT  Physical Exam:    VS:  BP 110/72   Pulse (!) 54   Ht $R'5\' 2"'fF$  (1.575 m)   Wt 170 lb (77.1 kg)   SpO2 (!) 54%   BMI 31.09 kg/m     Wt Readings from Last 3 Encounters:  04/19/20 170 lb (77.1 kg)  12/29/19 171 lb (77.6 kg)  09/16/19 172 lb (78 kg)     GEN:  Well nourished, well developed in no acute distress HEENT: Normal NECK: No  JVD; soft carotid bruit on the right side LYMPHATICS: No lymphadenopathy CARDIAC: RRR, no murmurs, no rubs, no gallops RESPIRATORY:  Clear to auscultation without rales, wheezing or rhonchi  ABDOMEN: Soft, non-tender, non-distended MUSCULOSKELETAL:  No edema; No deformity  SKIN: Warm and dry LOWER EXTREMITIES: no swelling NEUROLOGIC:  Alert and oriented x 3 PSYCHIATRIC:  Normal affect   ASSESSMENT:    1. Atrial fibrillation, chronic ([place])   2. Essential hypertension   3. Type 2 diabetes mellitus with  microalbuminuria, with long-term current use of insulin ([place])   4. Mixed hyperlipidemia   5. Paroxysmal atrial fibrillation ([facility])    PLAN:    In order of problems listed above:  1. Paroxysmal atrial fibrillation.  EKG today shows sinus bradycardia rate 59, nonspecific ST segment changes.  Continue anticoagulation continue rest of the medication. 2. Essential hypertension blood pressure today is 110/72.  We will continue present management controlled 3. Dyslipidemia follow-up her K p.m. showing any LDL of 66 and HDL of 41 this is from 22 April.  She is on high intensity statin in form of Crestor 40 mg daily which I will continue. 4. Cardiovascular disease: I will schedule her to have carotic ultrasound.  Also echocardiogram will be done to check left ventricle ejection fraction.   Medication Adjustments/Labs and Tests Ordered: Current medicines are reviewed at length with the patient today.  Concerns regarding medicines are outlined above.  No orders of the defined types were placed in this encounter.  Medication changes: No orders of the defined types were placed in this encounter.   Signed, Park Liter, MD, [facility] 04/19/2020 1:45 PM    [place]

## 2020-04-19 NOTE — Patient Instructions (Signed)
Medication Instructions:  Your physician recommends that you continue on your current medications as directed. Please refer to the Current Medication list given to you today.  *If you need a refill on your cardiac medications before your next appointment, please call your pharmacy*   Lab Work: None.  If you have labs (blood work) drawn today and your tests are completely normal, you will receive your results only by: Marland Kitchen MyChart Message (if you have MyChart) OR . A paper copy in the mail If you have any lab test that is abnormal or we need to change your treatment, we will call you to review the results.   Testing/Procedures: Your physician has requested that you have a carotid duplex. This test is an ultrasound of the carotid arteries in your neck. It looks at blood flow through these arteries that supply the brain with blood. Allow one hour for this exam. There are no restrictions or special instructions.  Your physician has requested that you have an echocardiogram. Echocardiography is a painless test that uses sound waves to create images of your heart. It provides your doctor with information about the size and shape of your heart and how well your heart's chambers and valves are working. This procedure takes approximately one hour. There are no restrictions for this procedure.     Follow-Up: At [facility], you and your health needs are our priority.  As part of our continuing mission to provide you with exceptional heart care, we have created designated Provider Care Teams.  These Care Teams include your primary Cardiologist (physician) and Advanced Practice Providers (APPs -  Physician Assistants and Nurse Practitioners) who all work together to provide you with the care you need, when you need it.  We recommend signing up for the patient portal called "MyChart".  Sign up information is provided on this After Visit Summary.  MyChart is used to connect with patients for Virtual  Visits (Telemedicine).  Patients are able to view lab/test results, encounter notes, upcoming appointments, etc.  Non-urgent messages can be sent to your provider as well.   To learn more about what you can do with MyChart, go to [email or web address].    Your next appointment:   5 month(s)  The format for your next appointment:   In Person  Provider:   Jenne Campus, MD   Other Instructions   Echocardiogram An echocardiogram is a procedure that uses painless sound waves (ultrasound) to produce an image of the heart. Images from an echocardiogram can provide important information about:  Signs of coronary artery disease (CAD).  Aneurysm detection. An aneurysm is a weak or damaged part of an artery wall that bulges out from the normal force of blood pumping through the body.  Heart size and shape. Changes in the size or shape of the heart can be associated with certain conditions, including heart failure, aneurysm, and CAD.  Heart muscle function.  Heart valve function.  Signs of a past heart attack.  Fluid buildup around the heart.  Thickening of the heart muscle.  A tumor or infectious growth around the heart valves. Tell a health care provider about:  Any allergies you have.  All medicines you are taking, including vitamins, herbs, eye drops, creams, and over-the-counter medicines.  Any blood disorders you have.  Any surgeries you have had.  Any medical conditions you have.  Whether you are pregnant or may be pregnant. What are the risks? Generally, this is a safe procedure. However, problems may occur, including:  Allergic reaction to dye (contrast) that may be used during the procedure. What happens before the procedure? No specific preparation is needed. You may eat and drink normally. What happens during the procedure?   An IV tube may be inserted into one of your veins.  You may receive contrast through this tube. A contrast is an injection that  improves the quality of the pictures from your heart.  A gel will be applied to your chest.  A wand-like tool (transducer) will be moved over your chest. The gel will help to transmit the sound waves from the transducer.  The sound waves will harmlessly bounce off of your heart to allow the heart images to be captured in real-time motion. The images will be recorded on a computer. The procedure may vary among health care providers and hospitals. What happens after the procedure?  You may return to your normal, everyday life, including diet, activities, and medicines, unless your health care provider tells you not to do that. Summary  An echocardiogram is a procedure that uses painless sound waves (ultrasound) to produce an image of the heart.  Images from an echocardiogram can provide important information about the size and shape of your heart, heart muscle function, heart valve function, and fluid buildup around your heart.  You do not need to do anything to prepare before this procedure. You may eat and drink normally.  After the echocardiogram is completed, you may return to your normal, everyday life, unless your health care provider tells you not to do that. This information is not intended to replace advice given to you by your health care provider. Make sure you discuss any questions you have with your health care provider. Document Revised: 03/26/2019 Document Reviewed: 01/05/2017 Elsevier Patient Education  [place].

## 2020-04-22 DIAGNOSIS — N183 Chronic kidney disease, stage 3 unspecified: Secondary | ICD-10-CM | POA: Diagnosis not present

## 2020-04-22 DIAGNOSIS — D631 Anemia in chronic kidney disease: Secondary | ICD-10-CM | POA: Diagnosis not present

## 2020-05-06 ENCOUNTER — Ambulatory Visit (INDEPENDENT_AMBULATORY_CARE_PROVIDER_SITE_OTHER): Payer: PPO

## 2020-05-06 ENCOUNTER — Other Ambulatory Visit: Payer: Self-pay

## 2020-05-06 DIAGNOSIS — I480 Paroxysmal atrial fibrillation: Secondary | ICD-10-CM | POA: Diagnosis not present

## 2020-05-06 DIAGNOSIS — I482 Chronic atrial fibrillation, unspecified: Secondary | ICD-10-CM | POA: Diagnosis not present

## 2020-05-06 DIAGNOSIS — I10 Essential (primary) hypertension: Secondary | ICD-10-CM

## 2020-05-06 DIAGNOSIS — Z794 Long term (current) use of insulin: Secondary | ICD-10-CM | POA: Diagnosis not present

## 2020-05-06 DIAGNOSIS — E782 Mixed hyperlipidemia: Secondary | ICD-10-CM

## 2020-05-06 DIAGNOSIS — R809 Proteinuria, unspecified: Secondary | ICD-10-CM | POA: Diagnosis not present

## 2020-05-06 DIAGNOSIS — E1129 Type 2 diabetes mellitus with other diabetic kidney complication: Secondary | ICD-10-CM | POA: Diagnosis not present

## 2020-05-06 DIAGNOSIS — R0989 Other specified symptoms and signs involving the circulatory and respiratory systems: Secondary | ICD-10-CM

## 2020-05-06 NOTE — Progress Notes (Signed)
Carotid duplex exam performed  Jimmy Casey Maxfield RDCS, RVT 

## 2020-05-06 NOTE — Progress Notes (Signed)
Complete echocardiogram has been performed.  Jimmy Amoura Ransier RDCS, VT

## 2020-05-09 DIAGNOSIS — D631 Anemia in chronic kidney disease: Secondary | ICD-10-CM | POA: Diagnosis not present

## 2020-05-09 DIAGNOSIS — N183 Chronic kidney disease, stage 3 unspecified: Secondary | ICD-10-CM | POA: Diagnosis not present

## 2020-05-10 ENCOUNTER — Telehealth: Payer: Self-pay | Admitting: Cardiology

## 2020-05-10 NOTE — Telephone Encounter (Signed)
Patient's husband is calling to follow up in regards to patient's echocardiogram results. Please call to discuss.

## 2020-05-10 NOTE — Telephone Encounter (Signed)
Called and spoke to patients husband per dpr. Advised him of results of echocardiogram and carotid ultrasound. He verbally understood. No further questions.

## 2020-05-11 DIAGNOSIS — I129 Hypertensive chronic kidney disease with stage 1 through stage 4 chronic kidney disease, or unspecified chronic kidney disease: Secondary | ICD-10-CM | POA: Diagnosis not present

## 2020-05-11 DIAGNOSIS — N2581 Secondary hyperparathyroidism of renal origin: Secondary | ICD-10-CM | POA: Diagnosis not present

## 2020-05-11 DIAGNOSIS — G8929 Other chronic pain: Secondary | ICD-10-CM | POA: Diagnosis not present

## 2020-05-11 DIAGNOSIS — N183 Chronic kidney disease, stage 3 unspecified: Secondary | ICD-10-CM | POA: Diagnosis not present

## 2020-05-11 DIAGNOSIS — D631 Anemia in chronic kidney disease: Secondary | ICD-10-CM | POA: Diagnosis not present

## 2020-05-23 DIAGNOSIS — M47816 Spondylosis without myelopathy or radiculopathy, lumbar region: Secondary | ICD-10-CM | POA: Diagnosis not present

## 2020-05-23 DIAGNOSIS — Z9889 Other specified postprocedural states: Secondary | ICD-10-CM | POA: Diagnosis not present

## 2020-05-23 DIAGNOSIS — M5030 Other cervical disc degeneration, unspecified cervical region: Secondary | ICD-10-CM | POA: Diagnosis not present

## 2020-05-23 DIAGNOSIS — F1120 Opioid dependence, uncomplicated: Secondary | ICD-10-CM | POA: Diagnosis not present

## 2020-05-23 DIAGNOSIS — M5136 Other intervertebral disc degeneration, lumbar region: Secondary | ICD-10-CM | POA: Diagnosis not present

## 2020-05-23 DIAGNOSIS — E669 Obesity, unspecified: Secondary | ICD-10-CM | POA: Diagnosis not present

## 2020-05-23 DIAGNOSIS — M545 Low back pain: Secondary | ICD-10-CM | POA: Diagnosis not present

## 2020-05-23 DIAGNOSIS — Z79891 Long term (current) use of opiate analgesic: Secondary | ICD-10-CM | POA: Diagnosis not present

## 2020-05-23 DIAGNOSIS — G894 Chronic pain syndrome: Secondary | ICD-10-CM | POA: Diagnosis not present

## 2020-05-23 DIAGNOSIS — Z981 Arthrodesis status: Secondary | ICD-10-CM | POA: Diagnosis not present

## 2020-05-23 DIAGNOSIS — M542 Cervicalgia: Secondary | ICD-10-CM | POA: Diagnosis not present

## 2020-05-24 DIAGNOSIS — D631 Anemia in chronic kidney disease: Secondary | ICD-10-CM | POA: Diagnosis not present

## 2020-05-24 DIAGNOSIS — N183 Chronic kidney disease, stage 3 unspecified: Secondary | ICD-10-CM | POA: Diagnosis not present

## 2020-06-07 DIAGNOSIS — N183 Chronic kidney disease, stage 3 unspecified: Secondary | ICD-10-CM | POA: Diagnosis not present

## 2020-06-07 DIAGNOSIS — D631 Anemia in chronic kidney disease: Secondary | ICD-10-CM | POA: Diagnosis not present

## 2020-06-16 DIAGNOSIS — E669 Obesity, unspecified: Secondary | ICD-10-CM | POA: Diagnosis not present

## 2020-06-16 DIAGNOSIS — M419 Scoliosis, unspecified: Secondary | ICD-10-CM | POA: Diagnosis not present

## 2020-06-16 DIAGNOSIS — Z9889 Other specified postprocedural states: Secondary | ICD-10-CM | POA: Diagnosis not present

## 2020-06-16 DIAGNOSIS — M5136 Other intervertebral disc degeneration, lumbar region: Secondary | ICD-10-CM | POA: Diagnosis not present

## 2020-06-16 DIAGNOSIS — M47816 Spondylosis without myelopathy or radiculopathy, lumbar region: Secondary | ICD-10-CM | POA: Diagnosis not present

## 2020-06-16 DIAGNOSIS — Z79891 Long term (current) use of opiate analgesic: Secondary | ICD-10-CM | POA: Diagnosis not present

## 2020-06-16 DIAGNOSIS — Z1389 Encounter for screening for other disorder: Secondary | ICD-10-CM | POA: Diagnosis not present

## 2020-06-16 DIAGNOSIS — G894 Chronic pain syndrome: Secondary | ICD-10-CM | POA: Diagnosis not present

## 2020-06-21 DIAGNOSIS — D631 Anemia in chronic kidney disease: Secondary | ICD-10-CM | POA: Diagnosis not present

## 2020-06-21 DIAGNOSIS — N183 Chronic kidney disease, stage 3 unspecified: Secondary | ICD-10-CM | POA: Diagnosis not present

## 2020-06-28 ENCOUNTER — Other Ambulatory Visit: Payer: Self-pay

## 2020-06-28 DIAGNOSIS — N179 Acute kidney failure, unspecified: Secondary | ICD-10-CM

## 2020-06-29 DIAGNOSIS — E1165 Type 2 diabetes mellitus with hyperglycemia: Secondary | ICD-10-CM | POA: Diagnosis not present

## 2020-06-29 DIAGNOSIS — N184 Chronic kidney disease, stage 4 (severe): Secondary | ICD-10-CM | POA: Diagnosis not present

## 2020-06-29 DIAGNOSIS — D638 Anemia in other chronic diseases classified elsewhere: Secondary | ICD-10-CM | POA: Diagnosis not present

## 2020-06-29 DIAGNOSIS — I129 Hypertensive chronic kidney disease with stage 1 through stage 4 chronic kidney disease, or unspecified chronic kidney disease: Secondary | ICD-10-CM | POA: Diagnosis not present

## 2020-06-29 DIAGNOSIS — E1142 Type 2 diabetes mellitus with diabetic polyneuropathy: Secondary | ICD-10-CM | POA: Diagnosis not present

## 2020-06-29 DIAGNOSIS — E039 Hypothyroidism, unspecified: Secondary | ICD-10-CM | POA: Diagnosis not present

## 2020-06-29 DIAGNOSIS — I4891 Unspecified atrial fibrillation: Secondary | ICD-10-CM | POA: Diagnosis not present

## 2020-06-29 DIAGNOSIS — E785 Hyperlipidemia, unspecified: Secondary | ICD-10-CM | POA: Diagnosis not present

## 2020-07-04 DIAGNOSIS — N183 Chronic kidney disease, stage 3 unspecified: Secondary | ICD-10-CM | POA: Diagnosis not present

## 2020-07-04 DIAGNOSIS — D631 Anemia in chronic kidney disease: Secondary | ICD-10-CM | POA: Diagnosis not present

## 2020-07-05 ENCOUNTER — Ambulatory Visit: Payer: PPO | Admitting: Vascular Surgery

## 2020-07-05 ENCOUNTER — Other Ambulatory Visit: Payer: Self-pay

## 2020-07-05 ENCOUNTER — Ambulatory Visit (HOSPITAL_COMMUNITY)
Admission: RE | Admit: 2020-07-05 | Discharge: 2020-07-05 | Disposition: A | Discharge status: Home / Self Care | Payer: PPO | Source: Ambulatory Visit | Attending: Surgery | Admitting: Surgery

## 2020-07-05 ENCOUNTER — Ambulatory Visit (INDEPENDENT_AMBULATORY_CARE_PROVIDER_SITE_OTHER)
Admission: RE | Admit: 2020-07-05 | Discharge: 2020-07-05 | Disposition: A | Discharge status: Home / Self Care | Payer: PPO | Source: Ambulatory Visit | Attending: Surgery | Admitting: Surgery

## 2020-07-05 ENCOUNTER — Encounter: Payer: Self-pay | Admitting: Vascular Surgery

## 2020-07-05 DIAGNOSIS — N179 Acute kidney failure, unspecified: Secondary | ICD-10-CM

## 2020-07-05 DIAGNOSIS — N185 Chronic kidney disease, stage 5: Secondary | ICD-10-CM

## 2020-07-05 DIAGNOSIS — I120 Hypertensive chronic kidney disease with stage 5 chronic kidney disease or end stage renal disease: Secondary | ICD-10-CM

## 2020-07-05 HISTORY — DX: Hypertensive chronic kidney disease with stage 5 chronic kidney disease or end stage renal disease: I12.0

## 2020-07-05 NOTE — Progress Notes (Signed)
Patient name: Robin Brewer MRN: [id number] DOB: 08-Feb-1946 Sex: female  REASON FOR CONSULT: Permanent dialysis access evaluation  HPI: Robin Brewer is a 74 y.o. female, with history of hypertension, diabetes, stage V CKD, COPD, atrial fibrillation on Eliquis that presents for evaluation of permanent dialysis access.  Patient has not started dialysis at this time.  Referral states place AVF now and delay AVG.  She has been under the care of Dr. Moshe Cipro.  She is right-handed.  No chest wall implants.  No previous access in the past.  No upper extremity numbness or tingling at this time.  Past Medical History:  Diagnosis Date  . Anxiety   . Anxiety state, unspecified 08/17/2013  . Arthritis   . Atrial fibrillation ([place])   . Bronchitis    hx of   . Chronic pain syndrome 08/17/2013  . Depression   . Diabetes ([place]) 08/17/2013  . Diabetes mellitus without complication ([place])   . HTN (hypertension) 08/17/2013  . Hyperlipidemia   . Hypertension   . Hypothyroidism   . Right carotid bruit   . S/P total hip arthroplasty 08/17/2013   Right. 08/12/13  . Shortness of breath    with exertion   . Unspecified hypothyroidism 08/17/2013    Past Surgical History:  Procedure Laterality Date  . ABDOMINAL HYSTERECTOMY    . BACK SURGERY    . NECK SURGERY    . TOTAL HIP ARTHROPLASTY Right 08/12/2013   Procedure: RIGHT TOTAL HIP ARTHROPLASTY ANTERIOR APPROACH;  Surgeon: [name], MD;  Location: [facility];  Service: Orthopedics;  Laterality: Right;    Family History  Problem Relation Age of Onset  . Transient ischemic attack Mother   . Heart disease Mother     SOCIAL HISTORY: Social History   Socioeconomic History  . Marital status: Married    Spouse name: Not on file  . Number of children: Not on file  . Years of education: Not on file  . Highest education level: Not on file  Occupational History  . Not on file  Tobacco Use  . Smoking status: Former Smoker    Packs/day: 0.25     Types: Cigarettes  . Smokeless tobacco: Never Used  Vaping Use  . Vaping Use: Never used  Substance and Sexual Activity  . Alcohol use: No  . Drug use: No  . Sexual activity: Not Currently  Other Topics Concern  . Not on file  Social History Narrative  . Not on file   Social Determinants of Health   Financial Resource Strain:   . Difficulty of Paying Living Expenses:   Food Insecurity:   . Worried About Charity fundraiser in the Last Year:   . Arboriculturist in the Last Year:   Transportation Needs:   . Film/video editor (Medical):   Marland Kitchen Lack of Transportation (Non-Medical):   Physical Activity:   . Days of Exercise per Week:   . Minutes of Exercise per Session:   Stress:   . Feeling of Stress :   Social Connections:   . Frequency of Communication with Friends and Family:   . Frequency of Social Gatherings with Friends and Family:   . Attends Religious Services:   . Active Member of Clubs or Organizations:   . Attends Archivist Meetings:   Marland Kitchen Marital Status:   Intimate Partner Violence:   . Fear of Current or Ex-Partner:   . Emotionally Abused:   Marland Kitchen Physically Abused:   .  Sexually Abused:     Allergies  Allergen Reactions  . Aspirin     Upset stomach   . Niacin And Related Hives    Current Outpatient Medications  Medication Sig Dispense Refill  . albuterol (PROAIR HFA) 108 (90 BASE) MCG/ACT inhaler Inhale 2 puffs into the lungs every 6 (six) hours as needed for wheezing.    Marland Kitchen amLODipine (NORVASC) 2.5 MG tablet Take 1 tablet by mouth daily.   6  . calcitRIOL (ROCALTROL) 0.25 MCG capsule Take 0.25 mcg by mouth 2 (two) times daily.    Marland Kitchen ELIQUIS 5 MG TABS tablet Take 1 tablet by mouth 2 (two) times daily.  12  . furosemide (LASIX) 80 MG tablet Take 160 mg by mouth daily.    . insulin aspart (NOVOLOG) 100 UNIT/ML injection Inject 12 Units into the skin 3 (three) times daily with meals.     Marland Kitchen labetalol (NORMODYNE) 300 MG tablet Take 1 tablet by mouth 2  (two) times daily.  12  . LEVEMIR FLEXTOUCH 100 UNIT/ML FlexPen 70 Units.     Marland Kitchen levothyroxine (SYNTHROID, LEVOTHROID) 50 MCG tablet Take 50 mcg by mouth daily before breakfast.    . mirtazapine (REMERON) 7.5 MG tablet Take 7.5 mg by mouth at bedtime.    Marland Kitchen omega-3 acid ethyl esters (LOVAZA) 1 g capsule Take by mouth 2 (two) times daily.    Marland Kitchen omeprazole (PRILOSEC) 20 MG capsule Take 1 capsule by mouth daily.    Marland Kitchen oxyCODONE-acetaminophen (PERCOCET/ROXICET) 5-325 MG tablet Take 1 tablet by mouth 2 (two) times daily as needed.    . ranolazine (RANEXA) 500 MG 12 hr tablet Take 1 tablet by mouth 2 (two) times daily.    . rosuvastatin (CRESTOR) 40 MG tablet Take 40 mg by mouth every evening.     . sertraline (ZOLOFT) 100 MG tablet Take 100 mg by mouth every evening.     No current facility-administered medications for this visit.    REVIEW OF SYSTEMS:  $RemoveB'[X]'rZKTkeqw$  denotes positive finding, $RemoveBeforeDEI'[ ]'AssmmUxolRivCEtz$  denotes negative finding Cardiac  Comments:  Chest pain or chest pressure:    Shortness of breath upon exertion:    Short of breath when lying flat:    Irregular heart rhythm:        Vascular    Pain in calf, thigh, or hip brought on by ambulation:    Pain in feet at night that wakes you up from your sleep:     Blood clot in your veins:    Leg swelling:         Pulmonary    Oxygen at home:    Productive cough:     Wheezing:         Neurologic    Sudden weakness in arms or legs:     Sudden numbness in arms or legs:     Sudden onset of difficulty speaking or slurred speech:    Temporary loss of vision in one eye:     Problems with dizziness:         Gastrointestinal    Blood in stool:     Vomited blood:         Genitourinary    Burning when urinating:     Blood in urine:        Psychiatric    Major depression:         Hematologic    Bleeding problems:    Problems with blood clotting too easily:  Skin    Rashes or ulcers:        Constitutional    Fever or chills:      PHYSICAL  EXAM: Vitals:   07/05/20 1356  BP: (!) 160/77  Pulse: (!) 59  Resp: 16  Temp: (!) 94.6 F (34.8 C)  TempSrc: Temporal  SpO2: 100%  Weight: 169 lb (76.7 kg)  Height: $Remove'5\' 2"'wtwemYQ$  (1.575 m)    GENERAL: The patient is a well-nourished female, in no acute distress. The vital signs are documented above. VASCULAR:  Palpable radial and brachial pulses bilateral upper extremities PULMONARY: There is good air exchange bilaterally without wheezing or rales. ABDOMEN: Soft and non-tender with normal pitched bowel sounds.  MUSCULOSKELETAL: There are no major deformities or cyanosis. NEUROLOGIC: No focal weakness or paresthesias are detected. SKIN: There are no ulcers or rashes noted. PSYCHIATRIC: The patient has a normal affect.  DATA:   Upper extremity vein mapping shows no usable surface veins in the left arm with the basilic vein emptying into the brachial vein early.  Looks like she has a usable basilic vein in the right arm.  Upper extremity arterial duplex shows triphasic waveforms in bilateral upper extremities  Assessment/Plan:  74 year old female with stage V CKD secondary to DM and HTN that presents for permanent dialysis access evaluation.  Discussed plans for placement in the nondominant arm which would be her left arm.  Unfortunately she has no usable surface veins in the left arm based on vein mapping.  I did use ultrasound in the office and looked at her basilic vein given there was a note of it emptying into the brachial vein early and it looked larger higher in the arm, but this does not appear usable given it does immediately empty into brachial vein above elbow and no usable distance to create a fistula.  Discussed option of left arm AV graft and per [facility] referral they have asked [place] to wait on AV graft at this time which I explained in detail to the patient as far as higher risk of infection and poorer long-term durability.  Discussed another option would be to use her right  arm which is her dominant arm and she does have a basilic vein that looks usable in the right arm.  She seems like she is having a hard time grasping the concept of dialysis and ultimately has an appointment with Dr. Moshe Cipro later this week.  I discussed that maybe she talk to Dr. Ronald Lobo in detail about her options further.  Whenever she calls back or [facility] calls back we will get her scheduled accordingly.   [name], MD [facility] Office: [phone]

## 2020-07-07 DIAGNOSIS — G8929 Other chronic pain: Secondary | ICD-10-CM | POA: Diagnosis not present

## 2020-07-07 DIAGNOSIS — N2581 Secondary hyperparathyroidism of renal origin: Secondary | ICD-10-CM | POA: Diagnosis not present

## 2020-07-07 DIAGNOSIS — I129 Hypertensive chronic kidney disease with stage 1 through stage 4 chronic kidney disease, or unspecified chronic kidney disease: Secondary | ICD-10-CM | POA: Diagnosis not present

## 2020-07-07 DIAGNOSIS — D631 Anemia in chronic kidney disease: Secondary | ICD-10-CM | POA: Diagnosis not present

## 2020-07-07 DIAGNOSIS — N183 Chronic kidney disease, stage 3 unspecified: Secondary | ICD-10-CM | POA: Diagnosis not present

## 2020-07-13 ENCOUNTER — Other Ambulatory Visit: Payer: Self-pay

## 2020-07-14 DIAGNOSIS — Z1389 Encounter for screening for other disorder: Secondary | ICD-10-CM | POA: Diagnosis not present

## 2020-07-14 DIAGNOSIS — M47816 Spondylosis without myelopathy or radiculopathy, lumbar region: Secondary | ICD-10-CM | POA: Diagnosis not present

## 2020-07-14 DIAGNOSIS — Z79891 Long term (current) use of opiate analgesic: Secondary | ICD-10-CM | POA: Diagnosis not present

## 2020-07-14 DIAGNOSIS — G894 Chronic pain syndrome: Secondary | ICD-10-CM | POA: Diagnosis not present

## 2020-07-14 DIAGNOSIS — M5136 Other intervertebral disc degeneration, lumbar region: Secondary | ICD-10-CM | POA: Diagnosis not present

## 2020-07-14 DIAGNOSIS — Z9889 Other specified postprocedural states: Secondary | ICD-10-CM | POA: Diagnosis not present

## 2020-07-14 DIAGNOSIS — M419 Scoliosis, unspecified: Secondary | ICD-10-CM | POA: Diagnosis not present

## 2020-07-18 DIAGNOSIS — D631 Anemia in chronic kidney disease: Secondary | ICD-10-CM | POA: Diagnosis not present

## 2020-07-18 DIAGNOSIS — N183 Chronic kidney disease, stage 3 unspecified: Secondary | ICD-10-CM | POA: Diagnosis not present

## 2020-08-02 DIAGNOSIS — N183 Chronic kidney disease, stage 3 unspecified: Secondary | ICD-10-CM | POA: Diagnosis not present

## 2020-08-02 DIAGNOSIS — D631 Anemia in chronic kidney disease: Secondary | ICD-10-CM | POA: Diagnosis not present

## 2020-08-05 ENCOUNTER — Telehealth: Payer: Self-pay

## 2020-08-05 NOTE — Telephone Encounter (Signed)
Patient's husband called stating that he was told that his wife could have her COVID testing completed in [place] and they would have to bring in the results. I spoke with Darrell Jewel to get clarification on the COVID testing protocol prior to surgery. She stated that there are only 2 COVID testing site that the patient will be able to use and if needed the COVID test could be rescheduled. We looked into the patient's chart and she did not have a COVID test scheduled. She then instructed that I give the message to Raynald Kemp, LPN to call patient's husband. Message was given to [place] and I advised Coralyn Mark (Patient's husband) that he should be receiving a call to discuss. Coralyn Mark voiced his understanding.

## 2020-08-08 ENCOUNTER — Other Ambulatory Visit (HOSPITAL_COMMUNITY)
Admission: RE | Admit: 2020-08-08 | Discharge: 2020-08-08 | Disposition: A | Discharge status: Home / Self Care | Payer: PPO | Source: Ambulatory Visit | Attending: Vascular Surgery | Admitting: Vascular Surgery

## 2020-08-08 DIAGNOSIS — Z20822 Contact with and (suspected) exposure to covid-19: Secondary | ICD-10-CM | POA: Insufficient documentation

## 2020-08-08 DIAGNOSIS — Z01812 Encounter for preprocedural laboratory examination: Secondary | ICD-10-CM | POA: Diagnosis not present

## 2020-08-08 LAB — SARS CORONAVIRUS 2 (TAT 6-24 HRS): SARS Coronavirus 2: NEGATIVE

## 2020-08-10 ENCOUNTER — Encounter (HOSPITAL_COMMUNITY): Payer: Self-pay | Admitting: Vascular Surgery

## 2020-08-10 ENCOUNTER — Ambulatory Visit (HOSPITAL_COMMUNITY): Payer: PPO | Admitting: Physician Assistant

## 2020-08-10 ENCOUNTER — Other Ambulatory Visit: Payer: Self-pay

## 2020-08-10 NOTE — Progress Notes (Signed)
SDW-pre-op call completed by both pt and pt spouse, Coralyn Mark ( per verbal request by pt) . Pt denies any acute SOB. Pt denies chest pain. Pt stated that she is under the care of Dr. Delena Bali, PCP and Dr. Agustin Cree, Cardiology. Pt denies having a chest x ray in the last year. Pt stated that last dose of Eliquis was Sunday evening as instructed. Pt made aware to stop taking vitamins, fish oil and herbal medications. Do not take any NSAIDs ie: Ibuprofen, Advil, Naproxen (Aleve), Motrin, BC and Goody Powder. Pt stated that fasting CBG ranges between 70-80's. Pt spouse made aware to have pt take 50% oh Levemir insulin at HS (35 units). Spouse made aware to have pt check CBG every 2 hours prior to arrival to hospital on DOS. Spouse made aware to treat a CBG < 70 with4 ounces of apple  juice, wait 15 minutes after intervention to recheck CBG, if CBG remains < 70, call Short Stay unit to speak with a nurse. Spouse reminded to have pt quarantine. Spouse verbalized understanding of all pre-op instructions.

## 2020-08-11 ENCOUNTER — Encounter (HOSPITAL_COMMUNITY)
Admission: RE | Disposition: A | Discharge status: Home / Self Care | Payer: Self-pay | Source: Home / Self Care | Attending: Vascular Surgery

## 2020-08-11 ENCOUNTER — Encounter (HOSPITAL_COMMUNITY): Payer: Self-pay | Admitting: Vascular Surgery

## 2020-08-11 ENCOUNTER — Ambulatory Visit (HOSPITAL_COMMUNITY)
Admission: RE | Admit: 2020-08-11 | Discharge: 2020-08-11 | Disposition: A | Discharge status: Home / Self Care | Payer: PPO | Attending: Vascular Surgery | Admitting: Vascular Surgery

## 2020-08-11 ENCOUNTER — Other Ambulatory Visit: Payer: Self-pay

## 2020-08-11 DIAGNOSIS — Z87891 Personal history of nicotine dependence: Secondary | ICD-10-CM | POA: Diagnosis not present

## 2020-08-11 DIAGNOSIS — N184 Chronic kidney disease, stage 4 (severe): Secondary | ICD-10-CM | POA: Diagnosis not present

## 2020-08-11 DIAGNOSIS — Z794 Long term (current) use of insulin: Secondary | ICD-10-CM | POA: Diagnosis not present

## 2020-08-11 DIAGNOSIS — E1122 Type 2 diabetes mellitus with diabetic chronic kidney disease: Secondary | ICD-10-CM | POA: Diagnosis not present

## 2020-08-11 DIAGNOSIS — I4819 Other persistent atrial fibrillation: Secondary | ICD-10-CM | POA: Diagnosis not present

## 2020-08-11 DIAGNOSIS — Z7901 Long term (current) use of anticoagulants: Secondary | ICD-10-CM | POA: Insufficient documentation

## 2020-08-11 DIAGNOSIS — Z79899 Other long term (current) drug therapy: Secondary | ICD-10-CM | POA: Diagnosis not present

## 2020-08-11 DIAGNOSIS — G894 Chronic pain syndrome: Secondary | ICD-10-CM | POA: Insufficient documentation

## 2020-08-11 DIAGNOSIS — I129 Hypertensive chronic kidney disease with stage 1 through stage 4 chronic kidney disease, or unspecified chronic kidney disease: Secondary | ICD-10-CM | POA: Insufficient documentation

## 2020-08-11 DIAGNOSIS — Z539 Procedure and treatment not carried out, unspecified reason: Secondary | ICD-10-CM | POA: Diagnosis not present

## 2020-08-11 DIAGNOSIS — E785 Hyperlipidemia, unspecified: Secondary | ICD-10-CM | POA: Insufficient documentation

## 2020-08-11 DIAGNOSIS — F329 Major depressive disorder, single episode, unspecified: Secondary | ICD-10-CM | POA: Insufficient documentation

## 2020-08-11 DIAGNOSIS — J449 Chronic obstructive pulmonary disease, unspecified: Secondary | ICD-10-CM | POA: Insufficient documentation

## 2020-08-11 DIAGNOSIS — E039 Hypothyroidism, unspecified: Secondary | ICD-10-CM | POA: Insufficient documentation

## 2020-08-11 DIAGNOSIS — Z79890 Hormone replacement therapy (postmenopausal): Secondary | ICD-10-CM | POA: Diagnosis not present

## 2020-08-11 DIAGNOSIS — F419 Anxiety disorder, unspecified: Secondary | ICD-10-CM | POA: Insufficient documentation

## 2020-08-11 HISTORY — DX: Anemia, unspecified: D64.9

## 2020-08-11 HISTORY — DX: Headache, unspecified: R51.9

## 2020-08-11 HISTORY — DX: Unspecified cataract: H26.9

## 2020-08-11 HISTORY — DX: Chronic kidney disease, unspecified: N18.9

## 2020-08-11 HISTORY — DX: Chronic obstructive pulmonary disease, unspecified: J44.9

## 2020-08-11 HISTORY — DX: Presence of spectacles and contact lenses: Z97.3

## 2020-08-11 LAB — POCT I-STAT, CHEM 8
BUN: 43 mg/dL — ABNORMAL HIGH (ref 8–23)
Calcium, Ion: 1.24 mmol/L (ref 1.15–1.40)
Chloride: 106 mmol/L (ref 98–111)
Creatinine, Ser: 4.2 mg/dL — ABNORMAL HIGH (ref 0.44–1.00)
Glucose, Bld: 190 mg/dL — ABNORMAL HIGH (ref 70–99)
HCT: 31 % — ABNORMAL LOW (ref 36.0–46.0)
Hemoglobin: 10.5 g/dL — ABNORMAL LOW (ref 12.0–15.0)
Potassium: 4.3 mmol/L (ref 3.5–5.1)
Sodium: 143 mmol/L (ref 135–145)
TCO2: 24 mmol/L (ref 22–32)

## 2020-08-11 LAB — GLUCOSE, CAPILLARY: Glucose-Capillary: 190 mg/dL — ABNORMAL HIGH (ref 70–99)

## 2020-08-11 SURGERY — CANCELLED PROCEDURE
Laterality: Bilateral

## 2020-08-11 MED ORDER — ORAL CARE MOUTH RINSE: 15.0000 mL | Freq: Once | OROMUCOSAL | Status: DC

## 2020-08-11 MED ORDER — LIDOCAINE-EPINEPHRINE 1 %-1:100000 IJ SOLN
INTRAMUSCULAR | Status: AC
  Filled 2020-08-11: qty 1

## 2020-08-11 MED ORDER — HYDRALAZINE HCL 20 MG/ML IJ SOLN
INTRAMUSCULAR | Status: AC
  Filled 2020-08-11: qty 1

## 2020-08-11 MED ORDER — LIDOCAINE 2% (20 MG/ML) 5 ML SYRINGE
INTRAMUSCULAR | Status: AC
  Filled 2020-08-11: qty 5

## 2020-08-11 MED ORDER — FENTANYL CITRATE (PF) 250 MCG/5ML IJ SOLN
INTRAMUSCULAR | Status: AC
  Filled 2020-08-11: qty 5

## 2020-08-11 MED ORDER — CHLORHEXIDINE GLUCONATE 4 % EX LIQD: 60.0000 mL | Freq: Once | CUTANEOUS | Status: DC

## 2020-08-11 MED ORDER — HYDRALAZINE HCL 20 MG/ML IJ SOLN
INTRAMUSCULAR | Status: AC | PRN
  Administered 2020-08-11: 10 mg via INTRAVENOUS

## 2020-08-11 MED ORDER — SODIUM CHLORIDE 0.9 % IV SOLN
INTRAVENOUS | Status: DC | PRN
  Administered 2020-08-11: 500 mL

## 2020-08-11 MED ORDER — CHLORHEXIDINE GLUCONATE 0.12 % MT SOLN: 15.0000 mL | Freq: Once | OROMUCOSAL | Status: DC

## 2020-08-11 MED ORDER — CHLORHEXIDINE GLUCONATE 0.12 % MT SOLN
OROMUCOSAL | Status: AC
  Filled 2020-08-11: qty 15

## 2020-08-11 MED ORDER — LIDOCAINE-EPINEPHRINE 1 %-1:100000 IJ SOLN
INTRAMUSCULAR | Status: DC | PRN
  Administered 2020-08-11: 20 mL

## 2020-08-11 MED ORDER — LACTATED RINGERS IV SOLN: INTRAVENOUS | Status: DC

## 2020-08-11 MED ORDER — SODIUM CHLORIDE 0.9 % IV SOLN
INTRAVENOUS | Status: AC
  Filled 2020-08-11: qty 1.2

## 2020-08-11 MED ORDER — CEFAZOLIN SODIUM-DEXTROSE 2-4 GM/100ML-% IV SOLN
2.0000 g | INTRAVENOUS | Status: DC
  Filled 2020-08-11: qty 100

## 2020-08-11 MED ORDER — SODIUM CHLORIDE 0.9 % IV SOLN: INTRAVENOUS | Status: DC

## 2020-08-11 NOTE — H&P (Signed)
HPI: Robin Brewer is a 74 y.o. female, with history of hypertension, diabetes, stage V CKD, COPD, atrial fibrillation on Eliquis that presents for evaluation of permanent dialysis access.  Patient has not started dialysis at this time.  Referral states place AVF now and delay AVG.  She has been under the care of Dr. Kathrene Bongo.  She is right-handed.  No chest wall implants.  No previous access in the past.  No upper extremity numbness or tingling at this time.      Past Medical History:  Diagnosis Date  . Anxiety   . Anxiety state, unspecified 08/17/2013  . Arthritis   . Atrial fibrillation ([facility])   . Bronchitis    hx of   . Chronic pain syndrome 08/17/2013  . Depression   . Diabetes ([facility]) 08/17/2013  . Diabetes mellitus without complication ([facility])   . HTN (hypertension) 08/17/2013  . Hyperlipidemia   . Hypertension   . Hypothyroidism   . Right carotid bruit   . S/P total hip arthroplasty 08/17/2013   Right. 08/12/13  . Shortness of breath    with exertion   . Unspecified hypothyroidism 08/17/2013         Past Surgical History:  Procedure Laterality Date  . ABDOMINAL HYSTERECTOMY    . BACK SURGERY    . NECK SURGERY    . TOTAL HIP ARTHROPLASTY Right 08/12/2013   Procedure: RIGHT TOTAL HIP ARTHROPLASTY ANTERIOR APPROACH;  Surgeon: Loanne Drilling, MD;  Location: [facility];  Service: Orthopedics;  Laterality: Right;         Family History  Problem Relation Age of Onset  . Transient ischemic attack Mother   . Heart disease Mother     SOCIAL HISTORY: Social History        Socioeconomic History  . Marital status: Married    Spouse name: Not on file  . Number of children: Not on file  . Years of education: Not on file  . Highest education level: Not on file  Occupational History  . Not on file  Tobacco Use  . Smoking status: Former Smoker    Packs/day: 0.25    Types: Cigarettes  . Smokeless tobacco: Never Used  Vaping Use  .  Vaping Use: Never used  Substance and Sexual Activity  . Alcohol use: No  . Drug use: No  . Sexual activity: Not Currently  Other Topics Concern  . Not on file  Social History Narrative  . Not on file   Social Determinants of Health      Financial Resource Strain:   . Difficulty of Paying Living Expenses:   Food Insecurity:   . Worried About Programme researcher, broadcasting/film/video in the Last Year:   . Barista in the Last Year:   Transportation Needs:   . Freight forwarder (Medical):   Marland Kitchen Lack of Transportation (Non-Medical):   Physical Activity:   . Days of Exercise per Week:   . Minutes of Exercise per Session:   Stress:   . Feeling of Stress :   Social Connections:   . Frequency of Communication with Friends and Family:   . Frequency of Social Gatherings with Friends and Family:   . Attends Religious Services:   . Active Member of Clubs or Organizations:   . Attends Banker Meetings:   Marland Kitchen Marital Status:   Intimate Partner Violence:   . Fear of Current or Ex-Partner:   . Emotionally Abused:   .  Physically Abused:   . Sexually Abused:          Allergies  Allergen Reactions  . Aspirin     Upset stomach   . Niacin And Related Hives          Current Outpatient Medications  Medication Sig Dispense Refill  . albuterol (PROAIR HFA) 108 (90 BASE) MCG/ACT inhaler Inhale 2 puffs into the lungs every 6 (six) hours as needed for wheezing.    Marland Kitchen amLODipine (NORVASC) 2.5 MG tablet Take 1 tablet by mouth daily.   6  . calcitRIOL (ROCALTROL) 0.25 MCG capsule Take 0.25 mcg by mouth 2 (two) times daily.    Marland Kitchen ELIQUIS 5 MG TABS tablet Take 1 tablet by mouth 2 (two) times daily.  12  . furosemide (LASIX) 80 MG tablet Take 160 mg by mouth daily.    . insulin aspart (NOVOLOG) 100 UNIT/ML injection Inject 12 Units into the skin 3 (three) times daily with meals.     Marland Kitchen labetalol (NORMODYNE) 300 MG tablet Take 1 tablet by mouth 2 (two) times daily.  12  .  LEVEMIR FLEXTOUCH 100 UNIT/ML FlexPen 70 Units.     Marland Kitchen levothyroxine (SYNTHROID, LEVOTHROID) 50 MCG tablet Take 50 mcg by mouth daily before breakfast.    . mirtazapine (REMERON) 7.5 MG tablet Take 7.5 mg by mouth at bedtime.    Marland Kitchen omega-3 acid ethyl esters (LOVAZA) 1 g capsule Take by mouth 2 (two) times daily.    Marland Kitchen omeprazole (PRILOSEC) 20 MG capsule Take 1 capsule by mouth daily.    Marland Kitchen oxyCODONE-acetaminophen (PERCOCET/ROXICET) 5-325 MG tablet Take 1 tablet by mouth 2 (two) times daily as needed.    . ranolazine (RANEXA) 500 MG 12 hr tablet Take 1 tablet by mouth 2 (two) times daily.    . rosuvastatin (CRESTOR) 40 MG tablet Take 40 mg by mouth every evening.     . sertraline (ZOLOFT) 100 MG tablet Take 100 mg by mouth every evening.     No current facility-administered medications for this visit.    REVIEW OF SYSTEMS:  $RemoveB'[X]'NHYRgWzb$  denotes positive finding, $RemoveBeforeDEI'[ ]'[id number]$  denotes negative finding Cardiac  Comments:  Chest pain or chest pressure:    Shortness of breath upon exertion:    Short of breath when lying flat:    Irregular heart rhythm:        Vascular    Pain in calf, thigh, or hip brought on by ambulation:    Pain in feet at night that wakes you up from your sleep:     Blood clot in your veins:    Leg swelling:         Pulmonary    Oxygen at home:    Productive cough:     Wheezing:         Neurologic    Sudden weakness in arms or legs:     Sudden numbness in arms or legs:     Sudden onset of difficulty speaking or slurred speech:    Temporary loss of vision in one eye:     Problems with dizziness:         Gastrointestinal    Blood in stool:     Vomited blood:         Genitourinary    Burning when urinating:     Blood in urine:        Psychiatric    Major depression:         Hematologic    Bleeding problems:  Problems with blood clotting too easily:        Skin      Rashes or ulcers:        Constitutional    Fever or chills:      PHYSICAL EXAM:  GENERAL: The patient is a well-nourished female, in no acute distress. The vital signs are documented above. VASCULAR:  Palpable radial and brachial pulses bilateral upper extremities PULMONARY: There is good air exchange bilaterally without wheezing or rales. ABDOMEN: Soft and non-tender with normal pitched bowel sounds.  MUSCULOSKELETAL: There are no major deformities or cyanosis. NEUROLOGIC: No focal weakness or paresthesias are detected. SKIN: There are no ulcers or rashes noted. PSYCHIATRIC: The patient has a normal affect.  DATA:   Upper extremity vein mapping shows no usable surface veins in the left arm with the basilic vein emptying into the brachial vein early.  Looks like she has a usable basilic vein in the right arm.  Upper extremity arterial duplex shows triphasic waveforms in bilateral upper extremities  Assessment/Plan:  74 year old female with stage V CKD secondary to DM and HTN that presents for permanent dialysis access evaluation. Will plan for fistula vs graft in her right arm.  Deni Lefever C. Donzetta Matters, MD [facility] Office: [phone] Pager: [phone]

## 2020-08-11 NOTE — Progress Notes (Signed)
Dr. Ola Spurr and Dr. Donzetta Matters aware of elevated blood pressure.  Hydralazine $RemoveBefo'10mg'dYezrUEoyKF$  administered.  Per Dr. Donzetta Matters, case will be cancelled.

## 2020-08-11 NOTE — Anesthesia Preprocedure Evaluation (Addendum)
Anesthesia Evaluation  Patient identified by MRN, date of birth, ID band Patient awake    Reviewed: Allergy & Precautions, NPO status , Patient's Chart, lab work & pertinent test results  Airway        Dental   Pulmonary COPD, former smoker,           Cardiovascular hypertension, Pt. on medications and Pt. on home beta blockers + Peripheral Vascular Disease  + dysrhythmias   Normal EF. Valves okay   Neuro/Psych    GI/Hepatic negative GI ROS, Neg liver ROS,   Endo/Other  diabetes, Type 2, Insulin DependentHypothyroidism   Renal/GU Renal disease     Musculoskeletal  (+) Arthritis ,   Abdominal   Peds  Hematology  (+) anemia ,   Anesthesia Other Findings   Reproductive/Obstetrics                             Anesthesia Physical Anesthesia Plan  ASA: III  Anesthesia Plan: MAC   Post-op Pain Management:    Induction: Intravenous  PONV Risk Score and Plan: 2 and Propofol infusion, Ondansetron and Treatment may vary due to age or medical condition  Airway Management Planned: Natural Airway and Simple Face Mask  Additional Equipment:   Intra-op Plan:   Post-operative Plan:   Informed Consent: I have reviewed the patients History and Physical, chart, labs and discussed the procedure including the risks, benefits and alternatives for the proposed anesthesia with the patient or authorized representative who has indicated his/her understanding and acceptance.       Plan Discussed with: CRNA  Anesthesia Plan Comments: (Case cancelled 2/2 uncontrolled HTN and cellulitis of operative limb. Discussed with Dr. Carlis Abbott who is speaking with family and plan to get patient back to PCP for better BP control and healing of arm wound prior to OR.)       Anesthesia Quick Evaluation

## 2020-08-11 NOTE — Progress Notes (Signed)
74 year old female that presents for right arm AV fistula.  On arrival today her blood pressure was 862 systolic and after aggressive management for the last 1.5 hours in pre-op she still has 824 systolic pressure.  In discussion with anesthesia and talking to the patient, I have recommended delaying her operation today given there is no emergent need for dialysis and she is chronic kidney disease and not ESRD.  I will send my office a message to get her rescheduled in 2 to 3 weeks.  I asked that she see her PCP to get her blood pressure medicines adjusted.  Not having any chest pain shortness of breath or other signs of end organ damage today.  [name], MD [facility] Office: [place]

## 2020-08-16 ENCOUNTER — Telehealth: Payer: Self-pay

## 2020-08-16 DIAGNOSIS — D631 Anemia in chronic kidney disease: Secondary | ICD-10-CM | POA: Diagnosis not present

## 2020-08-16 DIAGNOSIS — N183 Chronic kidney disease, stage 3 unspecified: Secondary | ICD-10-CM | POA: Diagnosis not present

## 2020-08-16 NOTE — Telephone Encounter (Signed)
Telephone call to pt in attempt to r/s for Rt arm AVF vs Lt arm AVG with Dr. Carlis Abbott after procedure canceled for elevated systolic pressure of 798 and still 102 systolic after aggressive management for 1.5 hours in pre-op. Pt has not followed up with PCP. Spouse states her nephrologist Dr. Moshe Cipro manages her blood pressure and her next appt is on 09/06/20. Pt/spouse was advised to contact provider regarding elevated blood pressure and further recommendations.Voiced understanding and agrees to contact provider office.    Advised will contact back for an update on blood pressure and attempt to reschedule.

## 2020-08-30 ENCOUNTER — Telehealth: Payer: Self-pay

## 2020-08-30 DIAGNOSIS — D631 Anemia in chronic kidney disease: Secondary | ICD-10-CM | POA: Diagnosis not present

## 2020-08-30 DIAGNOSIS — N183 Chronic kidney disease, stage 3 unspecified: Secondary | ICD-10-CM | POA: Diagnosis not present

## 2020-08-30 NOTE — Telephone Encounter (Signed)
Contacted pt to follow up on blood pressure control and attempt to schedule for Rt arm AVF with Dr. Carlis Abbott. Pt/husband reports nephrologist increased Amlodipine to 5 mg daily with BP readings ranging  from 130-150/60s. They would prefer to contact office back when ready to schedule due to the increased Covid cases at this time.

## 2020-08-31 DIAGNOSIS — G894 Chronic pain syndrome: Secondary | ICD-10-CM | POA: Diagnosis not present

## 2020-08-31 DIAGNOSIS — Z9889 Other specified postprocedural states: Secondary | ICD-10-CM | POA: Diagnosis not present

## 2020-08-31 DIAGNOSIS — M47816 Spondylosis without myelopathy or radiculopathy, lumbar region: Secondary | ICD-10-CM | POA: Diagnosis not present

## 2020-08-31 DIAGNOSIS — Z1389 Encounter for screening for other disorder: Secondary | ICD-10-CM | POA: Diagnosis not present

## 2020-08-31 DIAGNOSIS — M5136 Other intervertebral disc degeneration, lumbar region: Secondary | ICD-10-CM | POA: Diagnosis not present

## 2020-08-31 DIAGNOSIS — M419 Scoliosis, unspecified: Secondary | ICD-10-CM | POA: Diagnosis not present

## 2020-08-31 DIAGNOSIS — Z79891 Long term (current) use of opiate analgesic: Secondary | ICD-10-CM | POA: Diagnosis not present

## 2020-09-06 DIAGNOSIS — N183 Chronic kidney disease, stage 3 unspecified: Secondary | ICD-10-CM | POA: Diagnosis not present

## 2020-09-06 DIAGNOSIS — G8929 Other chronic pain: Secondary | ICD-10-CM | POA: Diagnosis not present

## 2020-09-06 DIAGNOSIS — I129 Hypertensive chronic kidney disease with stage 1 through stage 4 chronic kidney disease, or unspecified chronic kidney disease: Secondary | ICD-10-CM | POA: Diagnosis not present

## 2020-09-06 DIAGNOSIS — D631 Anemia in chronic kidney disease: Secondary | ICD-10-CM | POA: Diagnosis not present

## 2020-09-06 DIAGNOSIS — N2581 Secondary hyperparathyroidism of renal origin: Secondary | ICD-10-CM | POA: Diagnosis not present

## 2020-09-12 DIAGNOSIS — D631 Anemia in chronic kidney disease: Secondary | ICD-10-CM | POA: Diagnosis not present

## 2020-09-12 DIAGNOSIS — N189 Chronic kidney disease, unspecified: Secondary | ICD-10-CM | POA: Diagnosis not present

## 2020-09-13 DIAGNOSIS — D631 Anemia in chronic kidney disease: Secondary | ICD-10-CM | POA: Diagnosis not present

## 2020-09-13 DIAGNOSIS — N183 Chronic kidney disease, stage 3 unspecified: Secondary | ICD-10-CM | POA: Diagnosis not present

## 2020-09-19 ENCOUNTER — Other Ambulatory Visit: Payer: Self-pay

## 2020-09-19 ENCOUNTER — Ambulatory Visit (INDEPENDENT_AMBULATORY_CARE_PROVIDER_SITE_OTHER): Payer: PPO | Admitting: Cardiology

## 2020-09-19 ENCOUNTER — Encounter: Payer: Self-pay | Admitting: Cardiology

## 2020-09-19 VITALS — BP 174/72 | HR 62 | Ht 62.0 in | Wt 170.8 lb

## 2020-09-19 DIAGNOSIS — I480 Paroxysmal atrial fibrillation: Secondary | ICD-10-CM

## 2020-09-19 DIAGNOSIS — I10 Essential (primary) hypertension: Secondary | ICD-10-CM

## 2020-09-19 DIAGNOSIS — Z794 Long term (current) use of insulin: Secondary | ICD-10-CM

## 2020-09-19 DIAGNOSIS — R809 Proteinuria, unspecified: Secondary | ICD-10-CM

## 2020-09-19 DIAGNOSIS — E1129 Type 2 diabetes mellitus with other diabetic kidney complication: Secondary | ICD-10-CM

## 2020-09-19 DIAGNOSIS — R0989 Other specified symptoms and signs involving the circulatory and respiratory systems: Secondary | ICD-10-CM | POA: Diagnosis not present

## 2020-09-19 DIAGNOSIS — E782 Mixed hyperlipidemia: Secondary | ICD-10-CM | POA: Diagnosis not present

## 2020-09-19 NOTE — Patient Instructions (Signed)
Medication Instructions:  Your physician recommends that you continue on your current medications as directed. Please refer to the Current Medication list given to you today.  *If you need a refill on your cardiac medications before your next appointment, please call your pharmacy*   Lab Work: Nonw.  If you have labs (blood work) drawn today and your tests are completely normal, you will receive your results only by: Marland Kitchen MyChart Message (if you have MyChart) OR . A paper copy in the mail If you have any lab test that is abnormal or we need to change your treatment, we will call you to review the results.   Testing/Procedures: None.    Follow-Up: At [facility], you and your health needs are our priority.  As part of our continuing mission to provide you with exceptional heart care, we have created designated Provider Care Teams.  These Care Teams include your primary Cardiologist (physician) and Advanced Practice Providers (APPs -  Physician Assistants and Nurse Practitioners) who all work together to provide you with the care you need, when you need it.  We recommend signing up for the patient portal called "MyChart".  Sign up information is provided on this After Visit Summary.  MyChart is used to connect with patients for Virtual Visits (Telemedicine).  Patients are able to view lab/test results, encounter notes, upcoming appointments, etc.  Non-urgent messages can be sent to your provider as well.   To learn more about what you can do with MyChart, go to [email or web address].    Your next appointment:   5 month(s)  The format for your next appointment:   In Person  Provider:   Jenne Campus, MD   Other Instructions

## 2020-09-19 NOTE — Progress Notes (Signed)
Cardiology Office Note:    Date:  09/19/2020   ID:  Robin Brewer, DOB [birth year], MRN [id number]  PCP:  Nicoletta Dress, MD  Cardiologist:  Jenne Campus, MD    Referring MD: Nicoletta Dress, MD   No chief complaint on file. I am doing fine  History of Present Illness:    Robin Brewer is a 74 y.o. female with past medical history significant for paroxysmal atrial fibrillation, anticoagulated, COPD, essential hypertension, diabetes, peripheral vascular disease in form of carotic arterial disease which is noncritical, kidney failure.  She comes today 2 months for follow-up.  She was scheduled to have AV fistula to be done in the right arm by vascular surgeon for dialysis however when she went there her blood pressure was very high.  After that she did see nephrologist who increased dose of her Norvasc.  Now she takes 5 mg today in the office her blood pressure is 174/72 however she tells me that she check at home is always 130/80.  Denies have any cardiac complaint, no chest pain tightness squeezing pressure burning chest.  Her heart rate is regular look like she is in sinus rhythm.  Past Medical History:  Diagnosis Date   Anemia    Anxiety    Anxiety state, unspecified 08/17/2013   Arthritis    Atrial fibrillation (HCC)    Bronchitis    hx of    Chronic kidney disease    Chronic pain syndrome 08/17/2013   COPD (chronic obstructive pulmonary disease) ([place])    Depression    Diabetes ([place]) 08/17/2013   Diabetes mellitus without complication (HCC)    Early cataracts, bilateral    Headache    HTN (hypertension) 08/17/2013   Hyperlipidemia    Hypertension    Hypothyroidism    Right carotid bruit    S/P total hip arthroplasty 08/17/2013   Right. 08/12/13   Shortness of breath    with exertion    Unspecified hypothyroidism 08/17/2013   Wears glasses     Past Surgical History:  Procedure Laterality Date   ABDOMINAL HYSTERECTOMY     BACK SURGERY      CHOLECYSTECTOMY     COLONOSCOPY     NECK SURGERY     TOTAL HIP ARTHROPLASTY Right 08/12/2013   Procedure: RIGHT TOTAL HIP ARTHROPLASTY ANTERIOR APPROACH;  Surgeon: [name], MD;  Location: [facility];  Service: Orthopedics;  Laterality: Right;    Current Medications: Current Meds  Medication Sig   albuterol (PROAIR HFA) 108 (90 BASE) MCG/ACT inhaler Inhale 2 puffs into the lungs every 6 (six) hours as needed for wheezing.   amLODipine (NORVASC) 5 MG tablet Take 5 mg by mouth daily.   calcitRIOL (ROCALTROL) 0.25 MCG capsule Take 0.25 mcg by mouth 2 (two) times daily.   ELIQUIS 5 MG TABS tablet Take 5 mg by mouth 2 (two) times daily.    furosemide (LASIX) 80 MG tablet Take 160 mg by mouth daily. And 80 mg in the evening   insulin aspart (NOVOLOG) 100 UNIT/ML injection Inject 15 Units into the skin 3 (three) times daily before meals.    labetalol (NORMODYNE) 300 MG tablet Take 300 mg by mouth 2 (two) times daily.    LEVEMIR FLEXTOUCH 100 UNIT/ML FlexPen Inject 70 Units into the skin at bedtime.    levothyroxine (SYNTHROID, LEVOTHROID) 50 MCG tablet Take 50 mcg by mouth daily before breakfast.   mirtazapine (REMERON) 15 MG tablet Take 15 mg by mouth at bedtime.  omega-3 acid ethyl esters (LOVAZA) 1 g capsule Take 1 g by mouth 2 (two) times daily.    omeprazole (PRILOSEC) 20 MG capsule Take 20 mg by mouth daily.    oxyCODONE-acetaminophen (PERCOCET/ROXICET) 5-325 MG tablet Take 1 tablet by mouth in the morning and at bedtime.    ranolazine (RANEXA) 500 MG 12 hr tablet Take 500 mg by mouth 2 (two) times daily.    rosuvastatin (CRESTOR) 40 MG tablet Take 40 mg by mouth every evening.    sertraline (ZOLOFT) 100 MG tablet Take 100 mg by mouth every evening.     Allergies:   Aspirin and Niacin and related   Social History   Socioeconomic History   Marital status: Married    Spouse name: Not on file   Number of children: Not on file   Years of education: Not on file     Highest education level: Not on file  Occupational History   Not on file  Tobacco Use   Smoking status: Former Smoker    Packs/day: 0.25    Types: Cigarettes   Smokeless tobacco: Never Used  Vaping Use   Vaping Use: Never used  Substance and Sexual Activity   Alcohol use: No   Drug use: No   Sexual activity: Not Currently  Other Topics Concern   Not on file  Social History Narrative   Not on file   Social Determinants of Health   Financial Resource Strain:    Difficulty of Paying Living Expenses: Not on file  Food Insecurity:    Worried About [place] in the Last Year: Not on file   [place] in the Last Year: Not on file  Transportation Needs:    Lack of Transportation (Medical): Not on file   Lack of Transportation (Non-Medical): Not on file  Physical Activity:    Days of Exercise per Week: Not on file   Minutes of Exercise per Session: Not on file  Stress:    Feeling of Stress : Not on file  Social Connections:    Frequency of Communication with Friends and Family: Not on file   Frequency of Social Gatherings with Friends and Family: Not on file   Attends Religious Services: Not on file   Active Member of Clubs or Organizations: Not on file   Attends Archivist Meetings: Not on file   Marital Status: Not on file     Family History: The patient's family history includes Heart disease in her mother; Transient ischemic attack in her mother. ROS:   Please see the history of present illness.    All 14 point review of systems negative except as described per history of present illness  EKGs/Labs/Other Studies Reviewed:      Recent Labs: 08/11/2020: BUN 43; Creatinine, Ser 4.20; Hemoglobin 10.5; Potassium 4.3; Sodium 143  Recent Lipid Panel No results found for: CHOL, TRIG, HDL, CHOLHDL, VLDL, LDLCALC, LDLDIRECT  Physical Exam:    VS:  BP (!) 174/72    Pulse 62    Ht $R'5\' 2"'Sx$  (1.575 m)    Wt 170 lb 12.8 oz (77.5  kg)    SpO2 96%    BMI 31.24 kg/m     Wt Readings from Last 3 Encounters:  09/19/20 170 lb 12.8 oz (77.5 kg)  08/11/20 170 lb (77.1 kg)  07/05/20 169 lb (76.7 kg)     GEN:  Well nourished, well developed in no acute distress HEENT: Normal NECK: No JVD; No  carotid bruits LYMPHATICS: No lymphadenopathy CARDIAC: RRR, no murmurs, no rubs, no gallops RESPIRATORY:  Clear to auscultation without rales, wheezing or rhonchi  ABDOMEN: Soft, non-tender, non-distended MUSCULOSKELETAL:  No edema; No deformity  SKIN: Warm and dry LOWER EXTREMITIES: no swelling NEUROLOGIC:  Alert and oriented x 3 PSYCHIATRIC:  Normal affect   ASSESSMENT:    1. Paroxysmal atrial fibrillation (HCC)   2. Essential hypertension   3. Mixed hyperlipidemia   4. Type 2 diabetes mellitus with microalbuminuria, with long-term current use of insulin (HCC)   5. Right carotid bruit    PLAN:    In order of problems listed above:  1. Paroxysmal atrial fibrillation we will do EKG to confirm the rhythm she is on Eliquis which I will continue. 2. Essential hypertension blood pressure elevated today but she tells me at home it is 130/70.  I asked her to keep monitoring her blood pressure at home and let me know if blood pressure will be persistently more than 140/90.  If she will go for surgery and blood pressure will be elevated clonidine 0.5 mg on as-needed basis can be used to bring it down, 3. Mixed dyslipidemia: She is on Crestor 40 which I will continue.  I did review her K PN I do have data from 06/29/2020 showing LDL of 70 HDL of 37.  We will continue that management. 4. Type 2 diabetes followed by antimedicine team.  Last hemoglobin A1c I have a new K PN is 7.7% which is from 06/29/2020.  Need to be better she need to be closer to 7 or even less than that. 5. Right carotic bruit today I do not hear it.  Last carotic ultrasound showed noncritical stenosis.   Medication Adjustments/Labs and Tests Ordered: Current  medicines are reviewed at length with the patient today.  Concerns regarding medicines are outlined above.  No orders of the defined types were placed in this encounter.  Medication changes: No orders of the defined types were placed in this encounter.   Signed, Park Liter, MD, [facility] 09/19/2020 1:11 PM    [place]

## 2020-09-19 NOTE — Addendum Note (Signed)
Addended by: Senaida Ores on: 09/19/2020 01:27 PM   Modules accepted: Orders

## 2020-09-20 DIAGNOSIS — N189 Chronic kidney disease, unspecified: Secondary | ICD-10-CM | POA: Diagnosis not present

## 2020-09-20 DIAGNOSIS — D631 Anemia in chronic kidney disease: Secondary | ICD-10-CM | POA: Diagnosis not present

## 2020-09-26 DIAGNOSIS — M419 Scoliosis, unspecified: Secondary | ICD-10-CM | POA: Diagnosis not present

## 2020-09-26 DIAGNOSIS — Z1389 Encounter for screening for other disorder: Secondary | ICD-10-CM | POA: Diagnosis not present

## 2020-09-26 DIAGNOSIS — Z79891 Long term (current) use of opiate analgesic: Secondary | ICD-10-CM | POA: Diagnosis not present

## 2020-09-26 DIAGNOSIS — M47816 Spondylosis without myelopathy or radiculopathy, lumbar region: Secondary | ICD-10-CM | POA: Diagnosis not present

## 2020-09-26 DIAGNOSIS — G894 Chronic pain syndrome: Secondary | ICD-10-CM | POA: Diagnosis not present

## 2020-09-26 DIAGNOSIS — M5136 Other intervertebral disc degeneration, lumbar region: Secondary | ICD-10-CM | POA: Diagnosis not present

## 2020-09-26 DIAGNOSIS — Z9889 Other specified postprocedural states: Secondary | ICD-10-CM | POA: Diagnosis not present

## 2020-09-27 DIAGNOSIS — D631 Anemia in chronic kidney disease: Secondary | ICD-10-CM | POA: Diagnosis not present

## 2020-09-27 DIAGNOSIS — N183 Chronic kidney disease, stage 3 unspecified: Secondary | ICD-10-CM | POA: Diagnosis not present

## 2020-09-30 DIAGNOSIS — E039 Hypothyroidism, unspecified: Secondary | ICD-10-CM | POA: Diagnosis not present

## 2020-09-30 DIAGNOSIS — F3341 Major depressive disorder, recurrent, in partial remission: Secondary | ICD-10-CM | POA: Diagnosis not present

## 2020-09-30 DIAGNOSIS — E785 Hyperlipidemia, unspecified: Secondary | ICD-10-CM | POA: Diagnosis not present

## 2020-09-30 DIAGNOSIS — N184 Chronic kidney disease, stage 4 (severe): Secondary | ICD-10-CM | POA: Diagnosis not present

## 2020-09-30 DIAGNOSIS — D638 Anemia in other chronic diseases classified elsewhere: Secondary | ICD-10-CM | POA: Diagnosis not present

## 2020-09-30 DIAGNOSIS — E1165 Type 2 diabetes mellitus with hyperglycemia: Secondary | ICD-10-CM | POA: Diagnosis not present

## 2020-09-30 DIAGNOSIS — I129 Hypertensive chronic kidney disease with stage 1 through stage 4 chronic kidney disease, or unspecified chronic kidney disease: Secondary | ICD-10-CM | POA: Diagnosis not present

## 2020-09-30 DIAGNOSIS — E1142 Type 2 diabetes mellitus with diabetic polyneuropathy: Secondary | ICD-10-CM | POA: Diagnosis not present

## 2020-09-30 DIAGNOSIS — I4891 Unspecified atrial fibrillation: Secondary | ICD-10-CM | POA: Diagnosis not present

## 2020-10-05 ENCOUNTER — Other Ambulatory Visit: Payer: Self-pay

## 2020-10-11 DIAGNOSIS — D631 Anemia in chronic kidney disease: Secondary | ICD-10-CM | POA: Diagnosis not present

## 2020-10-11 DIAGNOSIS — N183 Chronic kidney disease, stage 3 unspecified: Secondary | ICD-10-CM | POA: Diagnosis not present

## 2020-10-17 ENCOUNTER — Other Ambulatory Visit (HOSPITAL_COMMUNITY)
Admission: RE | Admit: 2020-10-17 | Discharge: 2020-10-17 | Disposition: A | Discharge status: Home / Self Care | Payer: PPO | Source: Ambulatory Visit | Attending: Vascular Surgery | Admitting: Vascular Surgery

## 2020-10-17 DIAGNOSIS — Z01812 Encounter for preprocedural laboratory examination: Secondary | ICD-10-CM | POA: Diagnosis not present

## 2020-10-17 DIAGNOSIS — Z20822 Contact with and (suspected) exposure to covid-19: Secondary | ICD-10-CM | POA: Insufficient documentation

## 2020-10-17 LAB — SARS CORONAVIRUS 2 (TAT 6-24 HRS): SARS Coronavirus 2: NEGATIVE

## 2020-10-19 ENCOUNTER — Other Ambulatory Visit: Payer: Self-pay

## 2020-10-19 ENCOUNTER — Encounter (HOSPITAL_COMMUNITY): Payer: Self-pay | Admitting: Vascular Surgery

## 2020-10-19 NOTE — Progress Notes (Signed)
Spoke with Husband Robin Brewer [phone] for PAT information/instructions for DOS.  Husband Robin Brewer states patient does not have any SOB, fever, cough or chest pain.  PCP - Dr Nelda Bucks Cardiologist - Dr Jenne Campus Nephrology - Dr Corliss Parish  Chest x-ray - n/a EKG - 09/19/20 Stress Test - 01/17/17 (CE) ECHO - 05/06/20 Cardiac Cath - n/a  Fasting Blood Sugar - 80-130s Checks Blood Sugar 3  times a day  . THE NIGHT BEFORE SURGERY, take 35 Units Levemir Insulin.     . THE MORNING OF SURGERY, do not take Novolog Insulin unless your CBG is greater than 220 mg/dL.  Then you may take  of your sliding scale (correction) dose of insulin.  . If your blood sugar is less than 70 mg/dL, you will need to treat for low blood sugar:  o Treat a low blood sugar (less than 70 mg/dL) with  cup of clear juice (cranberry or apple), 4 glucose tablets, OR glucose gel. o Recheck blood sugar in 15 minutes after treatment (to make sure it is greater than 70 mg/dL). If your blood sugar is not greater than 70 mg/dL on recheck, call [phone] for further instructions.  Blood Thinner Instructions:  Last dose was on 10/16/20.  Anesthesia review: Yes  STOP now taking any Aspirin (unless otherwise instructed by your surgeon), Aleve, Naproxen, Ibuprofen, Motrin, Advil, Goody's, BC's, all herbal medications, fish oil, and all vitamins.   Coronavirus Screening Covid test on 10/17/20 was negative  Husband Robin Brewer verbalized understanding of instructions that were given to via phone.

## 2020-10-19 NOTE — Progress Notes (Addendum)
Anesthesia Chart Review: Same day workup  Was originally scheduled to have this procedure 08/11/2020 was markedly hypertensive on arrival to PAT.  Despite this intervention, she remained prohibitively hypertensive.  She was discharged for follow-up with nephrology and cardiology for management.  She was seen by nephrologist Dr. Moshe Cipro and her note indicated the pt was fluid overloaded and lasix was increased from $RemoveBefore'80mg'fIFiZpnWZgFfA$  BID to $Remo'160mg'ddldu$  AM and $Remo'80mg'pUfvq$  PM.   Follows with cardiology for history of paroxysmal atrial fibrillation on Eliquis, HTN, carotid disease.  Per cardiology notes she did have a stress test 01/17/2017 that was negative.  Last seen by Dr. Agustin Cree 09/19/2020.  Discussed her recent episode of hypertension that precluded surgery.  Per note, "She was scheduled to have AV fistula to be done in the right arm by vascular surgeon for dialysis however when she went there her blood pressure was very high.  After that she did see nephrologist who increased dose of her Norvasc.  Now she takes 5 mg today in the office her blood pressure is 174/72 however she tells me that she check at home is always 130/80.  Denies have any cardiac complaint, no chest pain tightness squeezing pressure burning chest.  Her heart rate is regular look like she is in sinus rhythm."  He advised patient to monitor blood pressure at home and call back if persistently over 140/90.  He also stated his blood pressure elevated at time of surgery clonidine 0.5 mg as needed can be used to bring it down.  Per Dr. Wendy Poet note, pt's last A1c was 7.7 on 06/29/20.  We will need day of surgery labs and evaluation.  EKG 09/19/2020: Sinus bradycardia.  58.  Septal infarct, age undetermined.  ST and T wave abnormality, consider lateral ischemia.  Unchanged from previous.  TTE 05/06/2020: 1. Left ventricular ejection fraction, by estimation, is 60 to 65%. The  left ventricle has normal function. The left ventricle has no regional  wall  motion abnormalities. There is moderate left ventricular hypertrophy.  Left ventricular diastolic  parameters are consistent with Grade II diastolic dysfunction  (pseudonormalization).  2. Right ventricular systolic function is normal. The right ventricular  size is normal. There is normal pulmonary artery systolic pressure.  3. Left atrial size was mild to moderately dilated.   Carotid duplex 05/06/2020: Summary:  Right Carotid: Velocities in the right ICA are consistent with a 40-59% stenosis. The ECA appears <50% stenosed.  Left Carotid: Velocities in the left ICA are consistent with a 40-59% stenosis. The ECA appears <50% stenosed.  Vertebrals: Bilateral vertebral arteries demonstrate antegrade flow.  Subclavians: Normal flow hemodynamics were seen in bilateral subclavian arteries.    Wynonia Musty [facility] Short Stay Center/Anesthesiology Phone [phone] 10/19/2020 10:47 AM

## 2020-10-19 NOTE — Anesthesia Preprocedure Evaluation (Addendum)
Anesthesia Evaluation  Patient identified by MRN, date of birth, ID band Patient awake    Reviewed: Allergy & Precautions, NPO status , Patient's Chart, lab work & pertinent test results  Airway Mallampati: III  TM Distance: >3 FB Neck ROM: Full    Dental  (+) Chipped,    Pulmonary shortness of breath and with exertion, COPD,  COPD inhaler, former smoker,    Pulmonary exam normal breath sounds clear to auscultation       Cardiovascular hypertension, Pt. on medications and Pt. on home beta blockers +CHF  Normal cardiovascular exam+ dysrhythmias Atrial Fibrillation  Rhythm:Regular Rate:Normal  ECG: SB, rate 58  ECHO: 1. Left ventricular ejection fraction, by estimation, is 60 to 65%. The left ventricle has normal function. The left ventricle has no regional wall motion abnormalities. There is moderate left ventricular hypertrophy.  Left ventricular diastolic parameters are consistent with Grade II diastolic dysfunction (pseudonormalization).  2. Right ventricular systolic function is normal. The right ventricular size is normal. There is normal pulmonary artery systolic pressure.  3. Left atrial size was mild to moderately dilated.   Neuro/Psych  Headaches, PSYCHIATRIC DISORDERS Anxiety Depression    GI/Hepatic Neg liver ROS, GERD  Medicated and Controlled,  Endo/Other  diabetes, Insulin DependentHypothyroidism   Renal/GU ESRFRenal disease     Musculoskeletal  (+) Arthritis , Chronic pain syndrome   Abdominal (+) + obese,   Peds  Hematology HLD   Anesthesia Other Findings CHRONIC KIDNEY DISEASE STAGE V  Reproductive/Obstetrics                           Anesthesia Physical Anesthesia Plan  ASA: III  Anesthesia Plan: General   Post-op Pain Management:    Induction: Intravenous  PONV Risk Score and Plan: 3 and Ondansetron, Dexamethasone and Treatment may vary due to age or medical  condition  Airway Management Planned: LMA  Additional Equipment:   Intra-op Plan:   Post-operative Plan: Extubation in OR  Informed Consent: I have reviewed the patients History and Physical, chart, labs and discussed the procedure including the risks, benefits and alternatives for the proposed anesthesia with the patient or authorized representative who has indicated his/her understanding and acceptance.     Dental advisory given  Plan Discussed with: CRNA  Anesthesia Plan Comments: (PAT note by Karoline Caldwell, PA-C:  Was originally scheduled to have this procedure 08/11/2020 was markedly hypertensive on arrival to PAT.  Despite this intervention, she remained prohibitively hypertensive.  She was discharged for follow-up with nephrology and cardiology for management.  She was seen by nephrologist Dr. Moshe Cipro and her note indicated the pt was fluid overloaded and lasix was increased from $RemoveBefore'80mg'srAPfrSPEukeg$  BID to $Remo'160mg'OPkit$  AM and $Remo'80mg'cNDUw$  PM.   Follows with cardiology for history of paroxysmal atrial fibrillation on Eliquis, HTN, carotid disease.  Per cardiology notes she did have a stress test 01/17/2017 that was negative.  Last seen by Dr. Agustin Cree 09/19/2020.  Discussed her recent episode of hypertension that precluded surgery.  Per note, "She was scheduled to have AV fistula to be done in the right arm by vascular surgeon for dialysis however when she went there her blood pressure was very high.  After that she did see nephrologist who increased dose of her Norvasc.  Now she takes 5 mg today in the office her blood pressure is 174/72 however she tells me that she check at home is always 130/80.  Denies have any cardiac complaint, no chest  pain tightness squeezing pressure burning chest.  Her heart rate is regular look like she is in sinus rhythm."  He advised patient to monitor blood pressure at home and call back if persistently over 140/90.  He also stated his blood pressure elevated at time of surgery  clonidine 0.5 mg as needed can be used to bring it down.  Per Dr. Wendy Poet note, pt's last A1c was 7.7 on 06/29/20.  We will need day of surgery labs and evaluation.  EKG 09/19/2020: Sinus bradycardia.  58.  Septal infarct, age undetermined.  ST and T wave abnormality, consider lateral ischemia.  Unchanged from previous.  TTE 05/06/2020: 1. Left ventricular ejection fraction, by estimation, is 60 to 65%. The  left ventricle has normal function. The left ventricle has no regional  wall motion abnormalities. There is moderate left ventricular hypertrophy.  Left ventricular diastolic  parameters are consistent with Grade II diastolic dysfunction  (pseudonormalization).  2. Right ventricular systolic function is normal. The right ventricular  size is normal. There is normal pulmonary artery systolic pressure.  3. Left atrial size was mild to moderately dilated.   Carotid duplex 05/06/2020: Summary:  Right Carotid: Velocities in the right ICA are consistent with a 40-59% stenosis. The ECA appears <50% stenosed.  Left Carotid: Velocities in the left ICA are consistent with a 40-59% stenosis. The ECA appears <50% stenosed.  Vertebrals: Bilateral vertebral arteries demonstrate antegrade flow.  Subclavians: Normal flow hemodynamics were seen in bilateral subclavian arteries.  )     Anesthesia Quick Evaluation

## 2020-10-20 ENCOUNTER — Ambulatory Visit (HOSPITAL_COMMUNITY): Payer: PPO | Admitting: Physician Assistant

## 2020-10-20 ENCOUNTER — Encounter (HOSPITAL_COMMUNITY)
Admission: RE | Disposition: A | Discharge status: Home / Self Care | Payer: Self-pay | Source: Home / Self Care | Attending: Vascular Surgery

## 2020-10-20 ENCOUNTER — Ambulatory Visit (HOSPITAL_COMMUNITY)
Admission: RE | Admit: 2020-10-20 | Discharge: 2020-10-20 | Disposition: A | Discharge status: Home / Self Care | Payer: PPO | Attending: Vascular Surgery | Admitting: Vascular Surgery

## 2020-10-20 DIAGNOSIS — I509 Heart failure, unspecified: Secondary | ICD-10-CM | POA: Diagnosis not present

## 2020-10-20 DIAGNOSIS — Z87891 Personal history of nicotine dependence: Secondary | ICD-10-CM | POA: Insufficient documentation

## 2020-10-20 DIAGNOSIS — I120 Hypertensive chronic kidney disease with stage 5 chronic kidney disease or end stage renal disease: Secondary | ICD-10-CM | POA: Insufficient documentation

## 2020-10-20 DIAGNOSIS — N185 Chronic kidney disease, stage 5: Secondary | ICD-10-CM | POA: Diagnosis not present

## 2020-10-20 DIAGNOSIS — E1122 Type 2 diabetes mellitus with diabetic chronic kidney disease: Secondary | ICD-10-CM | POA: Diagnosis not present

## 2020-10-20 DIAGNOSIS — Z7901 Long term (current) use of anticoagulants: Secondary | ICD-10-CM | POA: Diagnosis not present

## 2020-10-20 DIAGNOSIS — J449 Chronic obstructive pulmonary disease, unspecified: Secondary | ICD-10-CM | POA: Insufficient documentation

## 2020-10-20 DIAGNOSIS — Z886 Allergy status to analgesic agent status: Secondary | ICD-10-CM | POA: Insufficient documentation

## 2020-10-20 DIAGNOSIS — I4891 Unspecified atrial fibrillation: Secondary | ICD-10-CM | POA: Diagnosis not present

## 2020-10-20 DIAGNOSIS — N186 End stage renal disease: Secondary | ICD-10-CM | POA: Diagnosis not present

## 2020-10-20 DIAGNOSIS — Z992 Dependence on renal dialysis: Secondary | ICD-10-CM | POA: Diagnosis not present

## 2020-10-20 DIAGNOSIS — I132 Hypertensive heart and chronic kidney disease with heart failure and with stage 5 chronic kidney disease, or end stage renal disease: Secondary | ICD-10-CM | POA: Diagnosis not present

## 2020-10-20 HISTORY — DX: Gastro-esophageal reflux disease without esophagitis: K21.9

## 2020-10-20 HISTORY — DX: Heart failure, unspecified: I50.9

## 2020-10-20 HISTORY — DX: Cardiac arrhythmia, unspecified: I49.9

## 2020-10-20 HISTORY — PX: AV FISTULA PLACEMENT: SHX1204

## 2020-10-20 LAB — POCT I-STAT, CHEM 8
BUN: 53 mg/dL — ABNORMAL HIGH (ref 8–23)
Calcium, Ion: 1.07 mmol/L — ABNORMAL LOW (ref 1.15–1.40)
Chloride: 105 mmol/L (ref 98–111)
Creatinine, Ser: 5.3 mg/dL — ABNORMAL HIGH (ref 0.44–1.00)
Glucose, Bld: 142 mg/dL — ABNORMAL HIGH (ref 70–99)
HCT: 34 % — ABNORMAL LOW (ref 36.0–46.0)
Hemoglobin: 11.6 g/dL — ABNORMAL LOW (ref 12.0–15.0)
Potassium: 3.7 mmol/L (ref 3.5–5.1)
Sodium: 141 mmol/L (ref 135–145)
TCO2: 22 mmol/L (ref 22–32)

## 2020-10-20 LAB — GLUCOSE, CAPILLARY
Glucose-Capillary: 132 mg/dL — ABNORMAL HIGH (ref 70–99)
Glucose-Capillary: 146 mg/dL — ABNORMAL HIGH (ref 70–99)

## 2020-10-20 SURGERY — ARTERIOVENOUS (AV) FISTULA CREATION
Anesthesia: General | Site: Arm Upper | Laterality: Right

## 2020-10-20 MED ORDER — CEFAZOLIN SODIUM-DEXTROSE 2-4 GM/100ML-% IV SOLN
2.0000 g | INTRAVENOUS | Status: AC
  Administered 2020-10-20: 2 g via INTRAVENOUS
  Filled 2020-10-20: qty 100

## 2020-10-20 MED ORDER — LIDOCAINE HCL (CARDIAC) PF 100 MG/5ML IV SOSY
PREFILLED_SYRINGE | INTRAVENOUS | Status: DC | PRN
  Administered 2020-10-20: 100 mg via INTRATRACHEAL

## 2020-10-20 MED ORDER — FENTANYL CITRATE (PF) 250 MCG/5ML IJ SOLN
INTRAMUSCULAR | Status: DC | PRN
  Administered 2020-10-20: 50 ug via INTRAVENOUS

## 2020-10-20 MED ORDER — CHLORHEXIDINE GLUCONATE 0.12 % MT SOLN: 15.0000 mL | Freq: Once | OROMUCOSAL | Status: AC

## 2020-10-20 MED ORDER — ORAL CARE MOUTH RINSE: 15.0000 mL | Freq: Once | OROMUCOSAL | Status: AC

## 2020-10-20 MED ORDER — LIDOCAINE 2% (20 MG/ML) 5 ML SYRINGE
INTRAMUSCULAR | Status: AC
  Filled 2020-10-20: qty 5

## 2020-10-20 MED ORDER — SODIUM CHLORIDE 0.9 % IV SOLN
INTRAVENOUS | Status: AC
  Filled 2020-10-20: qty 1.2

## 2020-10-20 MED ORDER — LIDOCAINE HCL (PF) 1 % IJ SOLN
INTRAMUSCULAR | Status: AC
  Filled 2020-10-20: qty 30

## 2020-10-20 MED ORDER — LIDOCAINE HCL 1 % IJ SOLN
INTRAMUSCULAR | Status: DC | PRN
  Administered 2020-10-20: 7 mL

## 2020-10-20 MED ORDER — ONDANSETRON HCL 4 MG/2ML IJ SOLN
INTRAMUSCULAR | Status: DC | PRN
  Administered 2020-10-20: 4 mg via INTRAVENOUS

## 2020-10-20 MED ORDER — 0.9 % SODIUM CHLORIDE (POUR BTL) OPTIME
TOPICAL | Status: DC | PRN
  Administered 2020-10-20: 1000 mL

## 2020-10-20 MED ORDER — SODIUM CHLORIDE 0.9 % IV SOLN: INTRAVENOUS | Status: DC | PRN

## 2020-10-20 MED ORDER — SODIUM CHLORIDE 0.9 % IV SOLN: INTRAVENOUS | Status: DC

## 2020-10-20 MED ORDER — PROPOFOL 500 MG/50ML IV EMUL
INTRAVENOUS | Status: DC | PRN
  Administered 2020-10-20: 50 ug/kg/min via INTRAVENOUS

## 2020-10-20 MED ORDER — CHLORHEXIDINE GLUCONATE 0.12 % MT SOLN
OROMUCOSAL | Status: AC
  Administered 2020-10-20: 15 mL via OROMUCOSAL
  Filled 2020-10-20: qty 15

## 2020-10-20 MED ORDER — ONDANSETRON HCL 4 MG/2ML IJ SOLN
INTRAMUSCULAR | Status: AC
  Filled 2020-10-20: qty 2

## 2020-10-20 MED ORDER — CHLORHEXIDINE GLUCONATE 4 % EX LIQD: 60.0000 mL | Freq: Once | CUTANEOUS | Status: DC

## 2020-10-20 MED ORDER — PAPAVERINE HCL 30 MG/ML IJ SOLN
INTRAMUSCULAR | Status: AC
  Filled 2020-10-20: qty 2

## 2020-10-20 MED ORDER — FENTANYL CITRATE (PF) 100 MCG/2ML IJ SOLN: 25.0000 ug | INTRAMUSCULAR | Status: DC | PRN

## 2020-10-20 MED ORDER — ONDANSETRON HCL 4 MG/2ML IJ SOLN: 4.0000 mg | Freq: Once | INTRAMUSCULAR | Status: DC | PRN

## 2020-10-20 MED ORDER — ACETAMINOPHEN 10 MG/ML IV SOLN: 1000.0000 mg | Freq: Once | INTRAVENOUS | Status: DC | PRN

## 2020-10-20 MED ORDER — MIDAZOLAM HCL 2 MG/2ML IJ SOLN
INTRAMUSCULAR | Status: DC | PRN
  Administered 2020-10-20: 2 mg via INTRAVENOUS

## 2020-10-20 MED ORDER — HEPARIN SODIUM (PORCINE) 1000 UNIT/ML IJ SOLN
INTRAMUSCULAR | Status: DC | PRN
  Administered 2020-10-20: 3000 [IU] via INTRAVENOUS

## 2020-10-20 MED ORDER — PROPOFOL 10 MG/ML IV BOLUS
INTRAVENOUS | Status: AC
  Filled 2020-10-20: qty 20

## 2020-10-20 MED ORDER — OXYCODONE-ACETAMINOPHEN 5-325 MG PO TABS: 1.0000 | ORAL_TABLET | Freq: Four times a day (QID) | ORAL | 0 refills | Status: AC | PRN

## 2020-10-20 MED ORDER — FENTANYL CITRATE (PF) 250 MCG/5ML IJ SOLN
INTRAMUSCULAR | Status: AC
  Filled 2020-10-20: qty 5

## 2020-10-20 MED ORDER — SODIUM CHLORIDE 0.9 % IV SOLN
INTRAVENOUS | Status: DC | PRN
  Administered 2020-10-20: 500 mL

## 2020-10-20 SURGICAL SUPPLY — 34 items
ARMBAND PINK RESTRICT EXTREMIT (MISCELLANEOUS) ×4 IMPLANT
BLADE CLIPPER SURG (BLADE) ×2 IMPLANT
CANISTER SUCT 3000ML PPV (MISCELLANEOUS) ×2 IMPLANT
CLIP VESOCCLUDE MED 6/CT (CLIP) ×2 IMPLANT
CLIP VESOCCLUDE SM WIDE 6/CT (CLIP) ×2 IMPLANT
COVER PROBE W GEL 5X96 (DRAPES) ×2 IMPLANT
COVER WAND RF STERILE (DRAPES) ×2 IMPLANT
DECANTER SPIKE VIAL GLASS SM (MISCELLANEOUS) ×2 IMPLANT
DERMABOND ADVANCED (GAUZE/BANDAGES/DRESSINGS) ×1
DERMABOND ADVANCED .7 DNX12 (GAUZE/BANDAGES/DRESSINGS) ×1 IMPLANT
ELECT REM PT RETURN 9FT ADLT (ELECTROSURGICAL) ×2
ELECTRODE REM PT RTRN 9FT ADLT (ELECTROSURGICAL) ×1 IMPLANT
GLOVE BIO SURGEON STRL SZ7.5 (GLOVE) ×2 IMPLANT
GLOVE BIOGEL PI IND STRL 8 (GLOVE) ×1 IMPLANT
GLOVE BIOGEL PI INDICATOR 8 (GLOVE) ×1
GOWN STRL REUS W/ TWL LRG LVL3 (GOWN DISPOSABLE) ×2 IMPLANT
GOWN STRL REUS W/ TWL XL LVL3 (GOWN DISPOSABLE) ×2 IMPLANT
GOWN STRL REUS W/TWL LRG LVL3 (GOWN DISPOSABLE) ×4
GOWN STRL REUS W/TWL XL LVL3 (GOWN DISPOSABLE) ×4
HEMOSTAT SPONGE AVITENE ULTRA (HEMOSTASIS) IMPLANT
KIT BASIN OR (CUSTOM PROCEDURE TRAY) ×2 IMPLANT
KIT TURNOVER KIT B (KITS) ×2 IMPLANT
NS IRRIG 1000ML POUR BTL (IV SOLUTION) ×2 IMPLANT
PACK CV ACCESS (CUSTOM PROCEDURE TRAY) ×2 IMPLANT
PAD ARMBOARD 7.5X6 YLW CONV (MISCELLANEOUS) ×4 IMPLANT
SUT MNCRL AB 4-0 PS2 18 (SUTURE) ×2 IMPLANT
SUT PROLENE 6 0 BV (SUTURE) ×2 IMPLANT
SUT PROLENE 7 0 BV 1 (SUTURE) IMPLANT
SUT VIC AB 3-0 SH 27 (SUTURE) ×2
SUT VIC AB 3-0 SH 27X BRD (SUTURE) ×1 IMPLANT
SYR BULB IRRIG 60ML STRL (SYRINGE) ×2 IMPLANT
TOWEL GREEN STERILE (TOWEL DISPOSABLE) ×2 IMPLANT
UNDERPAD 30X36 HEAVY ABSORB (UNDERPADS AND DIAPERS) ×2 IMPLANT
WATER STERILE IRR 1000ML POUR (IV SOLUTION) ×2 IMPLANT

## 2020-10-20 NOTE — Op Note (Addendum)
OPERATIVE NOTE   PROCEDURE: right first stage basilic vein transposition (brachiobasilic arteriovenous fistula) placement  PRE-OPERATIVE DIAGNOSIS: Stage V CKD  POST-OPERATIVE DIAGNOSIS: same  SURGEON: [name], MD  ASSISTANT(S): [name], PA  ANESTHESIA: MAC  ESTIMATED BLOOD LOSS: Minimal  FINDING(S): 1.  Basilic vein: 2.5 mm, acceptable 2.  Brachial artery: 3.5 - 4 mm, atherosclerotic disease evident 3.  Venous outflow: palpable thrill  4.  Radial flow: palpable radial pulse  SPECIMEN(S):  none  INDICATIONS:   Robin Brewer is a 74 y.o. female who presents with stage 5 CKD and need for permanent hemodialysis access.  The patient is scheduled for right first stage basilic vein transposition.  The patient is aware the risks include but are not limited to: bleeding, infection, steal syndrome, nerve damage, ischemic monomelic neuropathy, failure to mature, and need for additional procedures.  The patient is aware of the risks of the procedure and elects to proceed forward.  An assistant was needed for exposure and to expedite the case.   DESCRIPTION: After full informed written consent was obtained from the patient, the patient was brought back to the operating room and placed supine upon the operating table.  Prior to induction, the patient received IV antibiotics.   After obtaining adequate anesthesia, the patient was then prepped and draped in the standard fashion for a right arm access procedure.  I turned my attention first to identifying the patient's basilic vein and brachial artery.    Using SonoSite guidance, the location of these vessels were marked out on the skin.   At this point, I injected local anesthetic to obtain a field block of the antecubitum.  In total, I injected about 10 mL of 1% lidocaine without epinephrine.  I made a transverse incision at the level of the antecubitum and dissected through the subcutaneous tissue and fascia to gain  exposure of the brachial artery.  This was noted to be 3.5 mm in diameter externally.  This was dissected out proximally and distally and controlled with vessel loops .  I then dissected out the basilic vein.  This was noted to be 2.5 mm in diameter externally.  I thought this was worth trying before placing a graft given she is not on dialysis yet.  The distal segment of the vein was ligated with a  2-0 silk, and the vein was transected.  The proximal segment was interrogated with serial dilators.  The vein accepted up to a 4 mm dilator without any difficulty.  I then instilled the heparinized saline into the vein and clamped it.  At this point, I reset my exposure of the brachial artery.  The patient was given 3000 units IV heparin.  I then placed the artery under tension proximally and distally.  I made an arteriotomy with a #11 blade, and then I extended the arteriotomy with a Potts scissor.  I injected heparinized saline proximal and distal to this arteriotomy.  The vein was then sewn to the artery in an end-to-side configuration with a running stitch of 6-0 Prolene.  Prior to completing this anastomosis, I allowed the vein and artery to backbleed.  There was no evidence of clot from any vessels.  I completed the anastomosis in the usual fashion and then released all vessel loops and clamps.    There was a palpable thrill in the venous outflow, and there was a palpable radial pulse.  At this point, I irrigated out the surgical wound.  There was  no further active bleeding.  The subcutaneous tissue was reapproximated with a running stitch of 3-0 Vicryl.  The skin was then reapproximated with a running subcuticular stitch of 4-0 Monocryl.  The skin was then cleaned, dried, and reinforced with Dermabond.  The patient tolerated this procedure well.   COMPLICATIONS: None  CONDITION: Stable  [name] MD [facility] Office: [place]   10/20/2020, 1:31 PM

## 2020-10-20 NOTE — Discharge Instructions (Signed)
° °  [facility] [place] ° °Discharge Instructions ° °AV Fistula or Graft Surgery for Dialysis Access ° °Please refer to the following instructions for your post-procedure care. Your surgeon or physician assistant will discuss any changes with you. ° °Activity ° °You may drive the day following your surgery, if you are comfortable and no longer taking prescription pain medication. Resume full activity as the soreness in your incision resolves. ° °Bathing/Showering ° °You may shower after you go home. Keep your incision dry for 48 hours. Do not soak in a bathtub, hot tub, or swim until the incision heals completely. You may not shower if you have a hemodialysis catheter. ° °Incision Care ° °Clean your incision with mild soap and water after 48 hours. Pat the area dry with a clean towel. You do not need a bandage unless otherwise instructed. Do not apply any ointments or creams to your incision. You may have skin glue on your incision. Do not peel it off. It will come off on its own in about one week. Your arm may swell a bit after surgery. To reduce swelling use pillows to elevate your arm so it is above your heart. Your doctor will tell you if you need to lightly wrap your arm with an ACE bandage. ° °Diet ° °Resume your normal diet. There are not special food restrictions following this procedure. In order to heal from your surgery, it is CRITICAL to get adequate nutrition. Your body requires vitamins, minerals, and protein. Vegetables are the best source of vitamins and minerals. Vegetables also provide the perfect balance of protein. Processed food has little nutritional value, so try to avoid this. ° °Medications ° °Resume taking all of your medications. If your incision is causing pain, you may take over-the counter pain relievers such as acetaminophen (Tylenol). If you were prescribed a stronger pain medication, please be aware these medications can cause nausea and constipation. Prevent  nausea by taking the medication with a snack or meal. Avoid constipation by drinking plenty of fluids and eating foods with high amount of fiber, such as fruits, vegetables, and grains. Do not take Tylenol if you are taking prescription pain medications. ° ° ° ° °Follow up °Your surgeon may want to see you in the office following your access surgery. If so, this will be arranged at the time of your surgery. ° °Please call us immediately for any of the following conditions: ° °Increased pain, redness, drainage (pus) from your incision site °Fever of 101 degrees or higher °Severe or worsening pain at your incision site °Hand pain or numbness. ° °Reduce your risk of vascular disease: ° °Stop smoking. If you would like help, call [facility] at 1-800-QUIT-NOW (1-800-784-8669) or [facility] at 336-586-4000 ° °Manage your cholesterol °Maintain a desired weight °Control your diabetes °Keep your blood pressure down ° °Dialysis ° °It will take several weeks to several months for your new dialysis access to be ready for use. Your surgeon will determine when it is OK to use it. Your nephrologist will continue to direct your dialysis. You can continue to use your Permcath until your new access is ready for use. ° °If you have any questions, please call the office at [phone]. ° °

## 2020-10-20 NOTE — Anesthesia Postprocedure Evaluation (Signed)
Anesthesia Post Note  Patient: Robin Brewer  Procedure(s) Performed: RIGHT ARM ARTERIOVENOUS (AV) FISTULA CREATION (Right Arm Upper)     Patient location during evaluation: PACU Anesthesia Type: MAC Level of consciousness: awake Pain management: pain level controlled Vital Signs Assessment: post-procedure vital signs reviewed and stable Respiratory status: spontaneous breathing, nonlabored ventilation, respiratory function stable and patient connected to nasal cannula oxygen Cardiovascular status: stable and blood pressure returned to baseline Postop Assessment: no apparent nausea or vomiting Anesthetic complications: no   No complications documented.  Last Vitals:  Vitals:   10/20/20 1350 10/20/20 1408  BP: (!) 152/79 (!) 146/53  Pulse: 62 (!) 53  Resp: 16 14  Temp: (!) 36.3 C   SpO2: 97% 97%    Last Pain:  Vitals:   10/20/20 1003  TempSrc:   PainSc: 0-No pain                 Abby Stines P Lanorris Kalisz

## 2020-10-20 NOTE — Transfer of Care (Signed)
Immediate Anesthesia Transfer of Care Note  Patient: Robin Brewer  Procedure(s) Performed: RIGHT ARM ARTERIOVENOUS (AV) FISTULA CREATION (Right Arm Upper)  Patient Location: PACU  Anesthesia Type:MAC  Level of Consciousness: awake, oriented and patient cooperative  Airway & Oxygen Therapy: Patient Spontanous Breathing  Post-op Assessment: Report given to RN, Post -op Vital signs reviewed and stable and Patient moving all extremities X 4  Post vital signs: Reviewed and stable  Last Vitals:  Vitals Value Taken Time  BP 152/79 10/20/20 1351  Temp    Pulse 58 10/20/20 1354  Resp 17 10/20/20 1354  SpO2 97 % 10/20/20 1354  Vitals shown include unvalidated device data.  Last Pain:  Vitals:   10/20/20 1003  TempSrc:   PainSc: 0-No pain      Patients Stated Pain Goal: 2 (29/56/21 3086)  Complications: No complications documented.

## 2020-10-20 NOTE — H&P (Signed)
History and Physical Interval Note:  10/20/2020 11:52 AM  Robin Brewer  has presented today for surgery, with the diagnosis of CHRONIC KIDNEY DISEASE STAGE V.  The various methods of treatment have been discussed with the patient and family. After consideration of risks, benefits and other options for treatment, the patient has consented to  Procedure(s): RIGHT ARM ARTERIOVENOUS (AV) FISTULA CREATION (Right) as a surgical intervention.  The patient's history has been reviewed, patient examined, no change in status, stable for surgery.  I have reviewed the patient's chart and labs.  Questions were answered to the patient's satisfaction.    Right arm AVF vs graft  Robin Brewer  Patient name: Robin Brewer         MRN: [id number]        DOB: Sep 19, 1946          Sex: female  REASON FOR CONSULT: Permanent dialysis access evaluation  HPI: Robin Brewer is a 74 y.o. female, with history of hypertension, diabetes, stage V CKD, COPD, atrial fibrillation on Eliquis that presents for evaluation of permanent dialysis access.  Patient has not started dialysis at this time.  Referral states place AVF now and delay AVG.  She has been under the care of Robin Brewer.  She is right-handed.  No chest wall implants.  No previous access in the past.  No upper extremity numbness or tingling at this time.      Past Medical History:  Diagnosis Date  . Anxiety   . Anxiety state, unspecified 08/17/2013  . Arthritis   . Atrial fibrillation ([place])   . Bronchitis    hx of   . Chronic pain syndrome 08/17/2013  . Depression   . Diabetes ([place]) 08/17/2013  . Diabetes mellitus without complication ([place])   . HTN (hypertension) 08/17/2013  . Hyperlipidemia   . Hypertension   . Hypothyroidism   . Right carotid bruit   . S/P total hip arthroplasty 08/17/2013   Right. 08/12/13  . Shortness of breath    with exertion   . Unspecified hypothyroidism 08/17/2013         Past Surgical History:   Procedure Laterality Date  . ABDOMINAL HYSTERECTOMY    . BACK SURGERY    . NECK SURGERY    . TOTAL HIP ARTHROPLASTY Right 08/12/2013   Procedure: RIGHT TOTAL HIP ARTHROPLASTY ANTERIOR APPROACH;  Surgeon: [name], MD;  Location: [facility];  Service: Orthopedics;  Laterality: Right;         Family History  Problem Relation Age of Onset  . Transient ischemic attack Mother   . Heart disease Mother     SOCIAL HISTORY: Social History        Socioeconomic History  . Marital status: Married    Spouse name: Not on file  . Number of children: Not on file  . Years of education: Not on file  . Highest education level: Not on file  Occupational History  . Not on file  Tobacco Use  . Smoking status: Former Smoker    Packs/day: 0.25    Types: Cigarettes  . Smokeless tobacco: Never Used  Vaping Use  . Vaping Use: Never used  Substance and Sexual Activity  . Alcohol use: No  . Drug use: No  . Sexual activity: Not Currently  Other Topics Concern  . Not on file  Social History Narrative  . Not on file   Social Determinants of Health      Financial Resource Strain:   .  Difficulty of Paying Living Expenses:   Food Insecurity:   . Worried About Charity fundraiser in the Last Year:   . Arboriculturist in the Last Year:   Transportation Needs:   . Film/video editor (Medical):   Marland Kitchen Lack of Transportation (Non-Medical):   Physical Activity:   . Days of Exercise per Week:   . Minutes of Exercise per Session:   Stress:   . Feeling of Stress :   Social Connections:   . Frequency of Communication with Friends and Family:   . Frequency of Social Gatherings with Friends and Family:   . Attends Religious Services:   . Active Member of Clubs or Organizations:   . Attends Archivist Meetings:   Marland Kitchen Marital Status:   Intimate Partner Violence:   . Fear of Current or Ex-Partner:   . Emotionally Abused:   Marland Kitchen Physically Abused:   . Sexually  Abused:          Allergies  Allergen Reactions  . Aspirin     Upset stomach   . Niacin And Related Hives          Current Outpatient Medications  Medication Sig Dispense Refill  . albuterol (PROAIR HFA) 108 (90 BASE) MCG/ACT inhaler Inhale 2 puffs into the lungs every 6 (six) hours as needed for wheezing.    Marland Kitchen amLODipine (NORVASC) 2.5 MG tablet Take 1 tablet by mouth daily.   6  . calcitRIOL (ROCALTROL) 0.25 MCG capsule Take 0.25 mcg by mouth 2 (two) times daily.    Marland Kitchen ELIQUIS 5 MG TABS tablet Take 1 tablet by mouth 2 (two) times daily.  12  . furosemide (LASIX) 80 MG tablet Take 160 mg by mouth daily.    . insulin aspart (NOVOLOG) 100 UNIT/ML injection Inject 12 Units into the skin 3 (three) times daily with meals.     Marland Kitchen labetalol (NORMODYNE) 300 MG tablet Take 1 tablet by mouth 2 (two) times daily.  12  . LEVEMIR FLEXTOUCH 100 UNIT/ML FlexPen 70 Units.     Marland Kitchen levothyroxine (SYNTHROID, LEVOTHROID) 50 MCG tablet Take 50 mcg by mouth daily before breakfast.    . mirtazapine (REMERON) 7.5 MG tablet Take 7.5 mg by mouth at bedtime.    Marland Kitchen omega-3 acid ethyl esters (LOVAZA) 1 g capsule Take by mouth 2 (two) times daily.    Marland Kitchen omeprazole (PRILOSEC) 20 MG capsule Take 1 capsule by mouth daily.    Marland Kitchen oxyCODONE-acetaminophen (PERCOCET/ROXICET) 5-325 MG tablet Take 1 tablet by mouth 2 (two) times daily as needed.    . ranolazine (RANEXA) 500 MG 12 hr tablet Take 1 tablet by mouth 2 (two) times daily.    . rosuvastatin (CRESTOR) 40 MG tablet Take 40 mg by mouth every evening.     . sertraline (ZOLOFT) 100 MG tablet Take 100 mg by mouth every evening.     No current facility-administered medications for this visit.    REVIEW OF SYSTEMS:  $RemoveB'[X]'KNlBzVtd$  denotes positive finding, $RemoveBeforeDEI'[ ]'dsvQFRnBdvPHhkEi$  denotes negative finding Cardiac  Comments:  Chest pain or chest pressure:    Shortness of breath upon exertion:    Short of breath when lying flat:    Irregular heart rhythm:         Vascular    Pain in calf, thigh, or hip brought on by ambulation:    Pain in feet at night that wakes you up from your sleep:     Blood clot in your veins:  Leg swelling:         Pulmonary    Oxygen at home:    Productive cough:     Wheezing:         Neurologic    Sudden weakness in arms or legs:     Sudden numbness in arms or legs:     Sudden onset of difficulty speaking or slurred speech:    Temporary loss of vision in one eye:     Problems with dizziness:         Gastrointestinal    Blood in stool:     Vomited blood:         Genitourinary    Burning when urinating:     Blood in urine:        Psychiatric    Major depression:         Hematologic    Bleeding problems:    Problems with blood clotting too easily:        Skin    Rashes or ulcers:        Constitutional    Fever or chills:      PHYSICAL EXAM:    Vitals:   07/05/20 1356  BP: (!) 160/77  Pulse: (!) 59  Resp: 16  Temp: (!) 94.6 F (34.8 C)  TempSrc: Temporal  SpO2: 100%  Weight: 169 lb (76.7 kg)  Height: $Remove'5\' 2"'QFqDzde$  (1.575 m)    GENERAL: The patient is a well-nourished female, in no acute distress. The vital signs are documented above. VASCULAR:  Palpable radial and brachial pulses bilateral upper extremities PULMONARY: There is good air exchange bilaterally without wheezing or rales. ABDOMEN: Soft and non-tender with normal pitched bowel sounds.  MUSCULOSKELETAL: There are no major deformities or cyanosis. NEUROLOGIC: No focal weakness or paresthesias are detected. SKIN: There are no ulcers or rashes noted. PSYCHIATRIC: The patient has a normal affect.  DATA:   Upper extremity vein mapping shows no usable surface veins in the left arm with the basilic vein emptying into the brachial vein early.  Looks like she has a usable basilic vein in the right arm.  Upper extremity arterial duplex shows  triphasic waveforms in bilateral upper extremities  Assessment/Plan:  74 year old female with stage V CKD secondary to DM and HTN that presents for permanent dialysis access evaluation.  Discussed plans for placement in the nondominant arm which would be her left arm.  Unfortunately she has no usable surface veins in the left arm based on vein mapping.  I did use ultrasound in the office and looked at her basilic vein given there was a note of it emptying into the brachial vein early and it looked larger higher in the arm, but this does not appear usable given it does immediately empty into brachial vein above elbow and no usable distance to create a fistula.  Discussed option of left arm AV graft and per [facility] referral they have asked [place] to wait on AV graft at this time which I explained in detail to the patient as far as higher risk of infection and poorer long-term durability.  Discussed another option would be to use her right arm which is her dominant arm and she does have a basilic vein that looks usable in the right arm.  She seems like she is having a hard time grasping the concept of dialysis and ultimately has an appointment with Robin Brewer later this week.  I discussed that maybe she talk to Dr. Ronald Lobo in detail  about her options further.  Whenever she calls back or [facility] calls back we will get her scheduled accordingly.   Robin Heck, MD [facility] [place] Office: [phone]

## 2020-10-21 ENCOUNTER — Encounter (HOSPITAL_COMMUNITY): Payer: Self-pay | Admitting: Vascular Surgery

## 2020-10-25 DIAGNOSIS — D631 Anemia in chronic kidney disease: Secondary | ICD-10-CM | POA: Diagnosis not present

## 2020-10-25 DIAGNOSIS — N183 Chronic kidney disease, stage 3 unspecified: Secondary | ICD-10-CM | POA: Diagnosis not present

## 2020-10-31 DIAGNOSIS — G894 Chronic pain syndrome: Secondary | ICD-10-CM | POA: Diagnosis not present

## 2020-10-31 DIAGNOSIS — Z9889 Other specified postprocedural states: Secondary | ICD-10-CM | POA: Diagnosis not present

## 2020-10-31 DIAGNOSIS — M47816 Spondylosis without myelopathy or radiculopathy, lumbar region: Secondary | ICD-10-CM | POA: Diagnosis not present

## 2020-10-31 DIAGNOSIS — Z79891 Long term (current) use of opiate analgesic: Secondary | ICD-10-CM | POA: Diagnosis not present

## 2020-10-31 DIAGNOSIS — M5136 Other intervertebral disc degeneration, lumbar region: Secondary | ICD-10-CM | POA: Diagnosis not present

## 2020-10-31 DIAGNOSIS — M419 Scoliosis, unspecified: Secondary | ICD-10-CM | POA: Diagnosis not present

## 2020-11-07 DIAGNOSIS — N183 Chronic kidney disease, stage 3 unspecified: Secondary | ICD-10-CM | POA: Diagnosis not present

## 2020-11-07 DIAGNOSIS — G8929 Other chronic pain: Secondary | ICD-10-CM | POA: Diagnosis not present

## 2020-11-07 DIAGNOSIS — N2581 Secondary hyperparathyroidism of renal origin: Secondary | ICD-10-CM | POA: Diagnosis not present

## 2020-11-07 DIAGNOSIS — I129 Hypertensive chronic kidney disease with stage 1 through stage 4 chronic kidney disease, or unspecified chronic kidney disease: Secondary | ICD-10-CM | POA: Diagnosis not present

## 2020-11-07 DIAGNOSIS — D631 Anemia in chronic kidney disease: Secondary | ICD-10-CM | POA: Diagnosis not present

## 2020-11-08 DIAGNOSIS — M47812 Spondylosis without myelopathy or radiculopathy, cervical region: Secondary | ICD-10-CM | POA: Diagnosis not present

## 2020-11-08 DIAGNOSIS — S0990XA Unspecified injury of head, initial encounter: Secondary | ICD-10-CM | POA: Diagnosis not present

## 2020-11-08 DIAGNOSIS — S60511A Abrasion of right hand, initial encounter: Secondary | ICD-10-CM | POA: Diagnosis not present

## 2020-11-08 DIAGNOSIS — Z043 Encounter for examination and observation following other accident: Secondary | ICD-10-CM | POA: Diagnosis not present

## 2020-11-08 DIAGNOSIS — I10 Essential (primary) hypertension: Secondary | ICD-10-CM | POA: Diagnosis not present

## 2020-11-08 DIAGNOSIS — S0011XA Contusion of right eyelid and periocular area, initial encounter: Secondary | ICD-10-CM | POA: Diagnosis not present

## 2020-11-08 DIAGNOSIS — M19011 Primary osteoarthritis, right shoulder: Secondary | ICD-10-CM | POA: Diagnosis not present

## 2020-11-08 DIAGNOSIS — R52 Pain, unspecified: Secondary | ICD-10-CM | POA: Diagnosis not present

## 2020-11-08 DIAGNOSIS — S62309A Unspecified fracture of unspecified metacarpal bone, initial encounter for closed fracture: Secondary | ICD-10-CM | POA: Diagnosis not present

## 2020-11-08 DIAGNOSIS — S62202A Unspecified fracture of first metacarpal bone, left hand, initial encounter for closed fracture: Secondary | ICD-10-CM | POA: Diagnosis not present

## 2020-11-08 DIAGNOSIS — M25511 Pain in right shoulder: Secondary | ICD-10-CM | POA: Diagnosis not present

## 2020-11-08 DIAGNOSIS — M7989 Other specified soft tissue disorders: Secondary | ICD-10-CM | POA: Diagnosis not present

## 2020-11-08 DIAGNOSIS — R609 Edema, unspecified: Secondary | ICD-10-CM | POA: Diagnosis not present

## 2020-11-08 DIAGNOSIS — W19XXXA Unspecified fall, initial encounter: Secondary | ICD-10-CM | POA: Diagnosis not present

## 2020-11-08 DIAGNOSIS — M19041 Primary osteoarthritis, right hand: Secondary | ICD-10-CM | POA: Diagnosis not present

## 2020-11-16 DIAGNOSIS — S62515A Nondisplaced fracture of proximal phalanx of left thumb, initial encounter for closed fracture: Secondary | ICD-10-CM | POA: Diagnosis not present

## 2020-11-16 DIAGNOSIS — M19042 Primary osteoarthritis, left hand: Secondary | ICD-10-CM | POA: Diagnosis not present

## 2020-11-16 DIAGNOSIS — S60519A Abrasion of unspecified hand, initial encounter: Secondary | ICD-10-CM | POA: Diagnosis not present

## 2020-11-22 DIAGNOSIS — N183 Chronic kidney disease, stage 3 unspecified: Secondary | ICD-10-CM | POA: Diagnosis not present

## 2020-11-22 DIAGNOSIS — D631 Anemia in chronic kidney disease: Secondary | ICD-10-CM | POA: Diagnosis not present

## 2020-11-23 ENCOUNTER — Other Ambulatory Visit: Payer: Self-pay

## 2020-11-23 DIAGNOSIS — I120 Hypertensive chronic kidney disease with stage 5 chronic kidney disease or end stage renal disease: Secondary | ICD-10-CM

## 2020-11-23 DIAGNOSIS — M19042 Primary osteoarthritis, left hand: Secondary | ICD-10-CM | POA: Diagnosis not present

## 2020-11-23 DIAGNOSIS — S62515A Nondisplaced fracture of proximal phalanx of left thumb, initial encounter for closed fracture: Secondary | ICD-10-CM | POA: Diagnosis not present

## 2020-11-23 DIAGNOSIS — S60519A Abrasion of unspecified hand, initial encounter: Secondary | ICD-10-CM | POA: Diagnosis not present

## 2020-11-28 DIAGNOSIS — M419 Scoliosis, unspecified: Secondary | ICD-10-CM | POA: Diagnosis not present

## 2020-11-28 DIAGNOSIS — Z9889 Other specified postprocedural states: Secondary | ICD-10-CM | POA: Diagnosis not present

## 2020-11-28 DIAGNOSIS — Z79891 Long term (current) use of opiate analgesic: Secondary | ICD-10-CM | POA: Diagnosis not present

## 2020-11-28 DIAGNOSIS — M47816 Spondylosis without myelopathy or radiculopathy, lumbar region: Secondary | ICD-10-CM | POA: Diagnosis not present

## 2020-11-28 DIAGNOSIS — M5136 Other intervertebral disc degeneration, lumbar region: Secondary | ICD-10-CM | POA: Diagnosis not present

## 2020-11-28 DIAGNOSIS — G894 Chronic pain syndrome: Secondary | ICD-10-CM | POA: Diagnosis not present

## 2020-12-02 ENCOUNTER — Ambulatory Visit (HOSPITAL_COMMUNITY)
Admission: RE | Admit: 2020-12-02 | Discharge: 2020-12-02 | Disposition: A | Discharge status: Home / Self Care | Payer: PPO | Source: Ambulatory Visit | Attending: Vascular Surgery | Admitting: Vascular Surgery

## 2020-12-02 ENCOUNTER — Ambulatory Visit (INDEPENDENT_AMBULATORY_CARE_PROVIDER_SITE_OTHER): Payer: Self-pay | Admitting: Physician Assistant

## 2020-12-02 ENCOUNTER — Other Ambulatory Visit: Payer: Self-pay

## 2020-12-02 VITALS — BP 144/69 | HR 54 | Temp 98.0°F | Resp 20 | Ht 62.0 in | Wt 167.6 lb

## 2020-12-02 DIAGNOSIS — I120 Hypertensive chronic kidney disease with stage 5 chronic kidney disease or end stage renal disease: Secondary | ICD-10-CM | POA: Diagnosis not present

## 2020-12-02 DIAGNOSIS — N185 Chronic kidney disease, stage 5: Secondary | ICD-10-CM

## 2020-12-02 NOTE — Progress Notes (Signed)
POST OPERATIVE OFFICE NOTE    CC:  F/u for surgery  HPI:  This is a 74 y.o. female who is s/p right 1st stage basilic vein transposition on 10/20/2020 by Dr. Carlis Abbott.   She has hx of hypertension, diabetes, stage V CKD, COPD,atrial fibrillation on Eliquis.  She is right hand dominant.   Pt states she does not have pain/numbness in right hand.  Her left wrist is splinted after a recent fall.    Dr. Moshe Cipro is her nephrologist.   The pt is not on dialysis.   Allergies  Allergen Reactions  . Aspirin     Upset stomach   . Niacin And Related Hives    Current Outpatient Medications  Medication Sig Dispense Refill  . albuterol (PROAIR HFA) 108 (90 BASE) MCG/ACT inhaler Inhale 2 puffs into the lungs every 6 (six) hours as needed for wheezing.    Marland Kitchen amLODipine (NORVASC) 5 MG tablet Take 5 mg by mouth daily.    . calcitRIOL (ROCALTROL) 0.25 MCG capsule Take 0.25 mcg by mouth 2 (two) times daily.    . cetirizine (ZYRTEC) 10 MG tablet Take 10 mg by mouth at bedtime.    Marland Kitchen ELIQUIS 5 MG TABS tablet Take 5 mg by mouth 2 (two) times daily.   12  . furosemide (LASIX) 80 MG tablet Take 240 mg by mouth daily.     . insulin aspart (NOVOLOG) 100 UNIT/ML injection Inject 15 Units into the skin 3 (three) times daily before meals.     Marland Kitchen labetalol (NORMODYNE) 300 MG tablet Take 300 mg by mouth 2 (two) times daily.   12  . LEVEMIR FLEXTOUCH 100 UNIT/ML FlexPen Inject 70 Units into the skin at bedtime.     Marland Kitchen levothyroxine (SYNTHROID, LEVOTHROID) 50 MCG tablet Take 50 mcg by mouth daily before breakfast.    . mirtazapine (REMERON) 7.5 MG tablet Take 7.5 mg by mouth at bedtime.     Marland Kitchen omega-3 acid ethyl esters (LOVAZA) 1 g capsule Take 1 g by mouth 2 (two) times daily.     Marland Kitchen omeprazole (PRILOSEC) 20 MG capsule Take 20 mg by mouth daily.     Marland Kitchen oxyCODONE-acetaminophen (PERCOCET/ROXICET) 5-325 MG tablet Take 1 tablet by mouth every 6 (six) hours as needed for severe pain. 10 tablet 0  . ranolazine (RANEXA)  500 MG 12 hr tablet Take 500 mg by mouth 2 (two) times daily.     . rosuvastatin (CRESTOR) 40 MG tablet Take 40 mg by mouth every evening.     . sertraline (ZOLOFT) 100 MG tablet Take 100 mg by mouth every evening.     No current facility-administered medications for this visit.   Facility-Administered Medications Ordered in Other Visits  Medication Dose Route Frequency Provider Last Rate Last Admin  . hydrALAZINE (APRESOLINE) injection   Intravenous Anesthesia Intra-op Genelle Bal, CRNA   10 mg at 08/11/20 1400     ROS:  See HPI  Physical Exam:  Today's Vitals   12/02/20 1329  BP: (!) 144/69  Pulse: (!) 54  Resp: 20  Temp: 98 F (36.7 C)  TempSrc: Temporal  SpO2: 99%  Weight: 167 lb 9.6 oz (76 kg)  Height: $Remove'5\' 2"'oTayIjH$  (1.575 m)  PainSc: 0-No pain   Body mass index is 30.65 kg/m.   Incision:  Healed nicely Extremities:   There is a palpable right radial pulse.   Motor and sensory are in tact.   There is a thrill/bruit present.  The fistula is easily  palpable   Dialysis Duplex on 12/02/2020: Diameter:  0.29-0.65cm Depth:  0.88-2.59cm   Assessment/Plan:  This is a 74 y.o. female who is s/p:  right 1st stage basilic vein transposition on 10/20/2020 by Dr. Carlis Abbott.    -the pt does not have evidence of steal.  She has a stenotic area in the mid upper arm of the fistula.  She is not yet on dialysis.  Discussed with Dr. Oneida Alar and will bring pt back in 4-6 weeks with repeat u/s.  She has an appt with Dr. Moshe Cipro at the end of Januray.  We will try to bring her back the same day to save her a trip as she lives in [place].   -discussed fistulas do not always mature as we would like and need additional work as well as they do not last forever and will eventually need new access.   -discussed 2nd stage BVT procedure as well -she and her family member expressed understanding.     Leontine Locket, [facility] [phone]  Clinic MD:  Donzetta Matters

## 2020-12-06 ENCOUNTER — Other Ambulatory Visit: Payer: Self-pay

## 2020-12-06 DIAGNOSIS — D631 Anemia in chronic kidney disease: Secondary | ICD-10-CM | POA: Diagnosis not present

## 2020-12-06 DIAGNOSIS — N183 Chronic kidney disease, stage 3 unspecified: Secondary | ICD-10-CM | POA: Diagnosis not present

## 2020-12-06 DIAGNOSIS — I120 Hypertensive chronic kidney disease with stage 5 chronic kidney disease or end stage renal disease: Secondary | ICD-10-CM

## 2020-12-20 DIAGNOSIS — D631 Anemia in chronic kidney disease: Secondary | ICD-10-CM | POA: Diagnosis not present

## 2020-12-20 DIAGNOSIS — N183 Chronic kidney disease, stage 3 unspecified: Secondary | ICD-10-CM | POA: Diagnosis not present

## 2020-12-21 DIAGNOSIS — S62515A Nondisplaced fracture of proximal phalanx of left thumb, initial encounter for closed fracture: Secondary | ICD-10-CM | POA: Diagnosis not present

## 2020-12-26 DIAGNOSIS — G894 Chronic pain syndrome: Secondary | ICD-10-CM | POA: Diagnosis not present

## 2020-12-26 DIAGNOSIS — M47816 Spondylosis without myelopathy or radiculopathy, lumbar region: Secondary | ICD-10-CM | POA: Diagnosis not present

## 2020-12-26 DIAGNOSIS — Z9889 Other specified postprocedural states: Secondary | ICD-10-CM | POA: Diagnosis not present

## 2020-12-26 DIAGNOSIS — Z79891 Long term (current) use of opiate analgesic: Secondary | ICD-10-CM | POA: Diagnosis not present

## 2020-12-26 DIAGNOSIS — M419 Scoliosis, unspecified: Secondary | ICD-10-CM | POA: Diagnosis not present

## 2020-12-26 DIAGNOSIS — M5136 Other intervertebral disc degeneration, lumbar region: Secondary | ICD-10-CM | POA: Diagnosis not present

## 2021-01-03 DIAGNOSIS — N183 Chronic kidney disease, stage 3 unspecified: Secondary | ICD-10-CM | POA: Diagnosis not present

## 2021-01-03 DIAGNOSIS — D631 Anemia in chronic kidney disease: Secondary | ICD-10-CM | POA: Diagnosis not present

## 2021-01-05 DIAGNOSIS — D638 Anemia in other chronic diseases classified elsewhere: Secondary | ICD-10-CM | POA: Diagnosis not present

## 2021-01-05 DIAGNOSIS — E039 Hypothyroidism, unspecified: Secondary | ICD-10-CM | POA: Diagnosis not present

## 2021-01-05 DIAGNOSIS — N185 Chronic kidney disease, stage 5: Secondary | ICD-10-CM | POA: Diagnosis not present

## 2021-01-05 DIAGNOSIS — I120 Hypertensive chronic kidney disease with stage 5 chronic kidney disease or end stage renal disease: Secondary | ICD-10-CM | POA: Diagnosis not present

## 2021-01-05 DIAGNOSIS — E1165 Type 2 diabetes mellitus with hyperglycemia: Secondary | ICD-10-CM | POA: Diagnosis not present

## 2021-01-05 DIAGNOSIS — F3341 Major depressive disorder, recurrent, in partial remission: Secondary | ICD-10-CM | POA: Diagnosis not present

## 2021-01-05 DIAGNOSIS — E785 Hyperlipidemia, unspecified: Secondary | ICD-10-CM | POA: Diagnosis not present

## 2021-01-05 DIAGNOSIS — E1142 Type 2 diabetes mellitus with diabetic polyneuropathy: Secondary | ICD-10-CM | POA: Diagnosis not present

## 2021-01-05 DIAGNOSIS — I4891 Unspecified atrial fibrillation: Secondary | ICD-10-CM | POA: Diagnosis not present

## 2021-01-09 DIAGNOSIS — Z0000 Encounter for general adult medical examination without abnormal findings: Secondary | ICD-10-CM | POA: Diagnosis not present

## 2021-01-09 DIAGNOSIS — Z1331 Encounter for screening for depression: Secondary | ICD-10-CM | POA: Diagnosis not present

## 2021-01-09 DIAGNOSIS — E785 Hyperlipidemia, unspecified: Secondary | ICD-10-CM | POA: Diagnosis not present

## 2021-01-09 DIAGNOSIS — Z9181 History of falling: Secondary | ICD-10-CM | POA: Diagnosis not present

## 2021-01-09 DIAGNOSIS — E669 Obesity, unspecified: Secondary | ICD-10-CM | POA: Diagnosis not present

## 2021-01-12 ENCOUNTER — Ambulatory Visit (HOSPITAL_COMMUNITY)
Admission: RE | Admit: 2021-01-12 | Discharge: 2021-01-12 | Disposition: A | Discharge status: Home / Self Care | Payer: PPO | Source: Ambulatory Visit | Attending: Physician Assistant | Admitting: Physician Assistant

## 2021-01-12 ENCOUNTER — Other Ambulatory Visit: Payer: Self-pay

## 2021-01-12 ENCOUNTER — Ambulatory Visit: Payer: Self-pay | Admitting: Physician Assistant

## 2021-01-12 VITALS — BP 176/70 | HR 56 | Temp 97.6°F | Resp 20 | Ht 62.0 in | Wt 168.6 lb

## 2021-01-12 DIAGNOSIS — I129 Hypertensive chronic kidney disease with stage 1 through stage 4 chronic kidney disease, or unspecified chronic kidney disease: Secondary | ICD-10-CM | POA: Diagnosis not present

## 2021-01-12 DIAGNOSIS — N185 Chronic kidney disease, stage 5: Secondary | ICD-10-CM

## 2021-01-12 DIAGNOSIS — I120 Hypertensive chronic kidney disease with stage 5 chronic kidney disease or end stage renal disease: Secondary | ICD-10-CM | POA: Diagnosis not present

## 2021-01-12 DIAGNOSIS — N183 Chronic kidney disease, stage 3 unspecified: Secondary | ICD-10-CM | POA: Diagnosis not present

## 2021-01-12 DIAGNOSIS — G8929 Other chronic pain: Secondary | ICD-10-CM | POA: Diagnosis not present

## 2021-01-12 DIAGNOSIS — D631 Anemia in chronic kidney disease: Secondary | ICD-10-CM | POA: Diagnosis not present

## 2021-01-12 DIAGNOSIS — N2581 Secondary hyperparathyroidism of renal origin: Secondary | ICD-10-CM | POA: Diagnosis not present

## 2021-01-12 NOTE — Progress Notes (Signed)
POST OPERATIVE OFFICE NOTE    CC:  F/u for surgery  HPI:  This is a 75 y.o. female who is s/p right 1st stage BVT on 10/20/2020 by Dr. Carlis Abbott.  She was seen on 12/02/2020 and at that time, her fistula was not maturing and had a stenotic area.  She was not yet on HD and she was brought back for another duplex to check the maturation.     Pt states she does not have pain/numbness in the right hand.    She saw Dr. Moshe Cipro this morning and she doesn't see her again for 3 months and her CKD is stable at this point.     Allergies  Allergen Reactions  . Aspirin     Upset stomach   . Niacin And Related Hives    Current Outpatient Medications  Medication Sig Dispense Refill  . albuterol (VENTOLIN HFA) 108 (90 Base) MCG/ACT inhaler Inhale 2 puffs into the lungs every 6 (six) hours as needed for wheezing.    Marland Kitchen amLODipine (NORVASC) 5 MG tablet Take 5 mg by mouth daily.    . calcitRIOL (ROCALTROL) 0.25 MCG capsule Take 0.25 mcg by mouth 2 (two) times daily.    . cetirizine (ZYRTEC) 10 MG tablet Take 10 mg by mouth at bedtime.    Marland Kitchen ELIQUIS 5 MG TABS tablet Take 5 mg by mouth 2 (two) times daily.   12  . furosemide (LASIX) 80 MG tablet Take 240 mg by mouth daily.     . insulin aspart (NOVOLOG) 100 UNIT/ML injection Inject 15 Units into the skin 3 (three) times daily before meals.     Marland Kitchen labetalol (NORMODYNE) 300 MG tablet Take 300 mg by mouth 2 (two) times daily.   12  . LEVEMIR FLEXTOUCH 100 UNIT/ML FlexPen Inject 70 Units into the skin at bedtime.     Marland Kitchen levothyroxine (SYNTHROID, LEVOTHROID) 50 MCG tablet Take 50 mcg by mouth daily before breakfast.    . omega-3 acid ethyl esters (LOVAZA) 1 g capsule Take 1 g by mouth 2 (two) times daily.     Marland Kitchen omeprazole (PRILOSEC) 20 MG capsule Take 20 mg by mouth daily.     Marland Kitchen oxyCODONE-acetaminophen (PERCOCET/ROXICET) 5-325 MG tablet Take 1 tablet by mouth every 6 (six) hours as needed for severe pain. 10 tablet 0  . ranolazine (RANEXA) 500 MG 12 hr  tablet Take 500 mg by mouth 2 (two) times daily.     . rosuvastatin (CRESTOR) 40 MG tablet Take 40 mg by mouth every evening.     . sertraline (ZOLOFT) 100 MG tablet Take 100 mg by mouth every evening.    Marland Kitchen FREESTYLE LITE test strip 4 (four) times daily.    . Lancets (FREESTYLE) lancets 1 each 2 (two) times daily.    . mirtazapine (REMERON) 15 MG tablet Take 15 mg by mouth at bedtime.     No current facility-administered medications for this visit.   Facility-Administered Medications Ordered in Other Visits  Medication Dose Route Frequency Provider Last Rate Last Admin  . hydrALAZINE (APRESOLINE) injection   Intravenous Anesthesia Intra-op Genelle Bal, CRNA   10 mg at 08/11/20 1400     ROS:  See HPI  Physical Exam:  Today's Vitals   01/12/21 1107  BP: (!) 176/70  Pulse: (!) 56  Resp: 20  Temp: 97.6 F (36.4 C)  TempSrc: Temporal  SpO2: 99%  Weight: 168 lb 9.6 oz (76.5 kg)  Height: $Remove'5\' 2"'QTJgGPD$  (1.575 m)   Body  mass index is 30.84 kg/m.   Incision:  healed Extremities:   There is a palpable right radial pulse.   Motor and sensory are in tact.   There is a thrill/bruit present.  The fistula/graft is easily palpable   Dialysis Duplex on 01/12/2021: +------------+----------+-------------+----------+--------+  OUTFLOW VEINPSV (cm/s)Diameter (cm)Depth (cm)Describe  +------------+----------+-------------+----------+--------+  Prox UA     209    0.77     2.30        +------------+----------+-------------+----------+--------+  Mid UA     132    0.69     2.69        +------------+----------+-------------+----------+--------+  Dist UA     291    0.52     1.60        +------------+----------+-------------+----------+--------+  AC Fossa    652    0.45     0.78  stenotic  +------------+----------+-------------+----------+--------+    Assessment/Plan:  This is a 74 y.o. female who is  s/p: 1st stage BVT on 10/20/2020 by Dr. Carlis Abbott.  -the pt does not have evidence of steal.  She continues to have a stenotic area but the fistula is maturing.  I again discussed with Dr. Oneida Alar and we will have pt return in 6-8 weeks to repeat duplex.  The pt is not yet on HD and does not follow up with Dr. Moshe Cipro for 3 months.   -pt and her husband are in agreement with this plan.   -I did discuss that access for dialysis does not last forever and may need additional procedures.  She expressed understanding.     Leontine Locket, [facility] [phone]  Clinic MD:  Oneida Alar

## 2021-01-16 ENCOUNTER — Other Ambulatory Visit: Payer: Self-pay

## 2021-01-16 DIAGNOSIS — I120 Hypertensive chronic kidney disease with stage 5 chronic kidney disease or end stage renal disease: Secondary | ICD-10-CM

## 2021-01-16 DIAGNOSIS — N185 Chronic kidney disease, stage 5: Secondary | ICD-10-CM

## 2021-01-17 DIAGNOSIS — D631 Anemia in chronic kidney disease: Secondary | ICD-10-CM | POA: Diagnosis not present

## 2021-01-17 DIAGNOSIS — N185 Chronic kidney disease, stage 5: Secondary | ICD-10-CM | POA: Diagnosis not present

## 2021-01-18 DIAGNOSIS — S62515D Nondisplaced fracture of proximal phalanx of left thumb, subsequent encounter for fracture with routine healing: Secondary | ICD-10-CM | POA: Diagnosis not present

## 2021-01-18 DIAGNOSIS — S60519A Abrasion of unspecified hand, initial encounter: Secondary | ICD-10-CM | POA: Diagnosis not present

## 2021-01-23 DIAGNOSIS — M419 Scoliosis, unspecified: Secondary | ICD-10-CM | POA: Diagnosis not present

## 2021-01-23 DIAGNOSIS — Z79891 Long term (current) use of opiate analgesic: Secondary | ICD-10-CM | POA: Diagnosis not present

## 2021-01-23 DIAGNOSIS — G894 Chronic pain syndrome: Secondary | ICD-10-CM | POA: Diagnosis not present

## 2021-01-23 DIAGNOSIS — Z9889 Other specified postprocedural states: Secondary | ICD-10-CM | POA: Diagnosis not present

## 2021-01-23 DIAGNOSIS — M47816 Spondylosis without myelopathy or radiculopathy, lumbar region: Secondary | ICD-10-CM | POA: Diagnosis not present

## 2021-01-23 DIAGNOSIS — M5136 Other intervertebral disc degeneration, lumbar region: Secondary | ICD-10-CM | POA: Diagnosis not present

## 2021-02-02 DIAGNOSIS — N185 Chronic kidney disease, stage 5: Secondary | ICD-10-CM | POA: Diagnosis not present

## 2021-02-02 DIAGNOSIS — D631 Anemia in chronic kidney disease: Secondary | ICD-10-CM | POA: Diagnosis not present

## 2021-02-08 DIAGNOSIS — M85852 Other specified disorders of bone density and structure, left thigh: Secondary | ICD-10-CM | POA: Diagnosis not present

## 2021-02-08 DIAGNOSIS — N959 Unspecified menopausal and perimenopausal disorder: Secondary | ICD-10-CM | POA: Diagnosis not present

## 2021-02-08 DIAGNOSIS — Z1231 Encounter for screening mammogram for malignant neoplasm of breast: Secondary | ICD-10-CM | POA: Diagnosis not present

## 2021-02-09 DIAGNOSIS — K219 Gastro-esophageal reflux disease without esophagitis: Secondary | ICD-10-CM | POA: Insufficient documentation

## 2021-02-09 DIAGNOSIS — N189 Chronic kidney disease, unspecified: Secondary | ICD-10-CM | POA: Insufficient documentation

## 2021-02-09 DIAGNOSIS — I4891 Unspecified atrial fibrillation: Secondary | ICD-10-CM | POA: Insufficient documentation

## 2021-02-09 DIAGNOSIS — J449 Chronic obstructive pulmonary disease, unspecified: Secondary | ICD-10-CM | POA: Insufficient documentation

## 2021-02-09 DIAGNOSIS — R0602 Shortness of breath: Secondary | ICD-10-CM | POA: Insufficient documentation

## 2021-02-09 DIAGNOSIS — J40 Bronchitis, not specified as acute or chronic: Secondary | ICD-10-CM | POA: Insufficient documentation

## 2021-02-09 DIAGNOSIS — R519 Headache, unspecified: Secondary | ICD-10-CM | POA: Insufficient documentation

## 2021-02-09 DIAGNOSIS — E119 Type 2 diabetes mellitus without complications: Secondary | ICD-10-CM | POA: Insufficient documentation

## 2021-02-09 DIAGNOSIS — I509 Heart failure, unspecified: Secondary | ICD-10-CM | POA: Insufficient documentation

## 2021-02-09 DIAGNOSIS — Z973 Presence of spectacles and contact lenses: Secondary | ICD-10-CM | POA: Insufficient documentation

## 2021-02-09 DIAGNOSIS — I10 Essential (primary) hypertension: Secondary | ICD-10-CM | POA: Insufficient documentation

## 2021-02-09 DIAGNOSIS — D649 Anemia, unspecified: Secondary | ICD-10-CM | POA: Insufficient documentation

## 2021-02-09 DIAGNOSIS — E785 Hyperlipidemia, unspecified: Secondary | ICD-10-CM | POA: Insufficient documentation

## 2021-02-09 DIAGNOSIS — M1990 Unspecified osteoarthritis, unspecified site: Secondary | ICD-10-CM | POA: Insufficient documentation

## 2021-02-09 DIAGNOSIS — F419 Anxiety disorder, unspecified: Secondary | ICD-10-CM | POA: Insufficient documentation

## 2021-02-09 DIAGNOSIS — I499 Cardiac arrhythmia, unspecified: Secondary | ICD-10-CM | POA: Insufficient documentation

## 2021-02-09 DIAGNOSIS — I4819 Other persistent atrial fibrillation: Secondary | ICD-10-CM | POA: Insufficient documentation

## 2021-02-09 DIAGNOSIS — H269 Unspecified cataract: Secondary | ICD-10-CM | POA: Insufficient documentation

## 2021-02-14 DIAGNOSIS — D631 Anemia in chronic kidney disease: Secondary | ICD-10-CM | POA: Diagnosis not present

## 2021-02-14 DIAGNOSIS — N185 Chronic kidney disease, stage 5: Secondary | ICD-10-CM | POA: Diagnosis not present

## 2021-02-17 ENCOUNTER — Encounter: Payer: Self-pay | Admitting: Cardiology

## 2021-02-17 ENCOUNTER — Other Ambulatory Visit: Payer: Self-pay

## 2021-02-17 ENCOUNTER — Ambulatory Visit: Payer: PPO | Admitting: Cardiology

## 2021-02-17 VITALS — BP 154/60 | HR 54 | Ht 62.0 in | Wt 170.0 lb

## 2021-02-17 DIAGNOSIS — I10 Essential (primary) hypertension: Secondary | ICD-10-CM | POA: Diagnosis not present

## 2021-02-17 DIAGNOSIS — N185 Chronic kidney disease, stage 5: Secondary | ICD-10-CM | POA: Diagnosis not present

## 2021-02-17 DIAGNOSIS — I120 Hypertensive chronic kidney disease with stage 5 chronic kidney disease or end stage renal disease: Secondary | ICD-10-CM

## 2021-02-17 DIAGNOSIS — I5032 Chronic diastolic (congestive) heart failure: Secondary | ICD-10-CM | POA: Diagnosis not present

## 2021-02-17 DIAGNOSIS — I480 Paroxysmal atrial fibrillation: Secondary | ICD-10-CM

## 2021-02-17 NOTE — Progress Notes (Signed)
Cardiology Office Note:    Date:  02/17/2021   ID:  Robin Brewer, DOB 03-13-1946, MRN [id number]  PCP:  Nicoletta Dress, MD  Cardiologist:  Jenne Campus, MD    Referring MD: Nicoletta Dress, MD   Chief Complaint  Patient presents with  . Follow-up  Doing fine but a weak and tired  History of Present Illness:    Robin Brewer is a 75 y.o. female   with past medical history significant for paroxysmal atrial fibrillation, anticoagulated, COPD, essential hypertension, diabetes, peripheral vascular disease in form of carotic arterial disease which is noncritical, kidney failure She comes today to my office for f follow-up overall she is doing well she complained of having weakness fatigue and tiredness but not up no palpitations no chest pain tightness squeezing pressure burning chest.  Past Medical History:  Diagnosis Date  . Acute encephalopathy 08/17/2013  . Anemia   . Anxiety   . Anxiety state, unspecified 08/17/2013  . ARF (acute renal failure) ([place]) 08/17/2013  . Arthritis   . Atrial fibrillation ([place])   . Bronchitis    hx of   . CHF (congestive heart failure) ([place])   . Chronic kidney disease   . Chronic pain syndrome 08/17/2013  . CKD stage 5 secondary to hypertension ([place]) 07/05/2020  . COPD (chronic obstructive pulmonary disease) ([place])   . Depression   . Diabetes ([place]) 08/17/2013  . Diabetes mellitus without complication ([place])    type 2  . Dysrhythmia    a-fib  . Early cataracts, bilateral   . Essential hypertension 08/17/2013  . GERD (gastroesophageal reflux disease)   . Headache   . HTN (hypertension) 08/17/2013  . Hyperlipidemia   . Hypertension   . Hypothyroidism   . OA (osteoarthritis) of hip 08/12/2013  . Paroxysmal atrial fibrillation ([place]) 04/19/2020  . Right carotid bruit   . S/P total hip arthroplasty 08/17/2013   Right. 08/12/13  . Shortness of breath    with exertion   . Unspecified hypothyroidism 08/17/2013  . Wears glasses     Past Surgical  History:  Procedure Laterality Date  . ABDOMINAL HYSTERECTOMY    . AV FISTULA PLACEMENT Right 10/20/2020   Procedure: RIGHT ARM ARTERIOVENOUS (AV) FISTULA CREATION;  Surgeon: [name], MD;  Location: [place];  Service: Vascular;  Laterality: Right;  . BACK SURGERY    . CHOLECYSTECTOMY    . COLONOSCOPY    . NECK SURGERY    . TOTAL HIP ARTHROPLASTY Right 08/12/2013   Procedure: RIGHT TOTAL HIP ARTHROPLASTY ANTERIOR APPROACH;  Surgeon: [name], MD;  Location[facility];  Service: Orthopedics;  Laterality: Right;    Current Medications: Current Meds  Medication Sig  . LEVEMIR FLEXTOUCH 100 UNIT/ML FlexPen Inject 65 Units into the skin at bedtime.  Marland Kitchen levothyroxine (SYNTHROID, LEVOTHROID) 50 MCG tablet Take 50 mcg by mouth daily before breakfast.  . mirtazapine (REMERON) 15 MG tablet Take 15 mg by mouth at bedtime.  Marland Kitchen omega-3 acid ethyl esters (LOVAZA) 1 g capsule Take 1 g by mouth 2 (two) times daily.   Marland Kitchen omeprazole (PRILOSEC) 20 MG capsule Take 20 mg by mouth daily.   Marland Kitchen oxyCODONE-acetaminophen (PERCOCET/ROXICET) 5-325 MG tablet Take 1 tablet by mouth every 6 (six) hours as needed for severe pain.  . ranolazine (RANEXA) 500 MG 12 hr tablet Take 500 mg by mouth 2 (two) times daily.   . rosuvastatin (CRESTOR) 40 MG tablet Take 40 mg by mouth every evening.   Marland Kitchen  sertraline (ZOLOFT) 100 MG tablet Take 100 mg by mouth every evening.     Allergies:   Aspirin and Niacin and related   Social History   Socioeconomic History  . Marital status: Married    Spouse name: Not on file  . Number of children: Not on file  . Years of education: Not on file  . Highest education level: Not on file  Occupational History  . Not on file  Tobacco Use  . Smoking status: Former Smoker    Packs/day: 0.25    Types: Cigarettes  . Smokeless tobacco: Never Used  Vaping Use  . Vaping Use: Never used  Substance and Sexual Activity  . Alcohol use: No  . Drug use: No  . Sexual activity: Not  Currently    Birth control/protection: Surgical    Comment: Hysterectomy  Other Topics Concern  . Not on file  Social History Narrative  . Not on file   Social Determinants of Health   Financial Resource Strain: Not on file  Food Insecurity: Not on file  Transportation Needs: Not on file  Physical Activity: Not on file  Stress: Not on file  Social Connections: Not on file     Family History: The patient's family history includes Heart disease in her mother; Transient ischemic attack in her mother. ROS:   Please see the history of present illness.    All 14 point review of systems negative except as described per history of present illness  EKGs/Labs/Other Studies Reviewed:      Recent Labs: 10/20/2020: BUN 53; Creatinine, Ser 5.30; Hemoglobin 11.6; Potassium 3.7; Sodium 141  Recent Lipid Panel No results found for: CHOL, TRIG, HDL, CHOLHDL, VLDL, LDLCALC, LDLDIRECT  Physical Exam:    VS:  BP (!) 154/60 (BP Location: Left Arm, Patient Position: Sitting)   Pulse (!) 54   Ht $R'5\' 2"'SA$  (1.575 m)   Wt 170 lb (77.1 kg)   SpO2 98%   BMI 31.09 kg/m     Wt Readings from Last 3 Encounters:  02/17/21 170 lb (77.1 kg)  01/12/21 168 lb 9.6 oz (76.5 kg)  12/02/20 167 lb 9.6 oz (76 kg)     GEN:  Well nourished, well developed in no acute distress HEENT: Normal NECK: No JVD; No carotid bruits LYMPHATICS: No lymphadenopathy CARDIAC: RRR, no murmurs, no rubs, no gallops RESPIRATORY:  Clear to auscultation without rales, wheezing or rhonchi  ABDOMEN: Soft, non-tender, non-distended MUSCULOSKELETAL:  No edema; No deformity  SKIN: Warm and dry LOWER EXTREMITIES: no swelling NEUROLOGIC:  Alert and oriented x 3 PSYCHIATRIC:  Normal affect   ASSESSMENT:    1. Paroxysmal atrial fibrillation (HCC)   2. Essential hypertension   3. CKD stage 5 secondary to hypertension ([place])   4. Chronic diastolic congestive heart failure ([place])    PLAN:    In order of problems listed  above:  1. Paroxysmal atrial fibrillation denies having a palpitation, she is anticoagulated which I will continue. 2. Essential hypertension blood pressure seems to be well controlled we will continue present medications. 3. Kidney failure she does have AV shunt in her right arm but poorly functioning.  Likely she still does not required dialysis.  She is being followed by nephrologist. 4. Chronic diastolic congestive heart failure seems to be compensated.  I will continue present management.   Medication Adjustments/Labs and Tests Ordered: Current medicines are reviewed at length with the patient today.  Concerns regarding medicines are outlined above.  No orders of the  defined types were placed in this encounter.  Medication changes: No orders of the defined types were placed in this encounter.   Signed, Park Liter, MD, [facility] 02/17/2021 11:39 AM    [place]

## 2021-02-17 NOTE — Patient Instructions (Signed)

## 2021-02-20 DIAGNOSIS — Z9889 Other specified postprocedural states: Secondary | ICD-10-CM | POA: Diagnosis not present

## 2021-02-20 DIAGNOSIS — M5136 Other intervertebral disc degeneration, lumbar region: Secondary | ICD-10-CM | POA: Diagnosis not present

## 2021-02-20 DIAGNOSIS — Z79891 Long term (current) use of opiate analgesic: Secondary | ICD-10-CM | POA: Diagnosis not present

## 2021-02-20 DIAGNOSIS — G894 Chronic pain syndrome: Secondary | ICD-10-CM | POA: Diagnosis not present

## 2021-02-20 DIAGNOSIS — M419 Scoliosis, unspecified: Secondary | ICD-10-CM | POA: Diagnosis not present

## 2021-02-20 DIAGNOSIS — M47816 Spondylosis without myelopathy or radiculopathy, lumbar region: Secondary | ICD-10-CM | POA: Diagnosis not present

## 2021-02-28 DIAGNOSIS — N185 Chronic kidney disease, stage 5: Secondary | ICD-10-CM | POA: Diagnosis not present

## 2021-02-28 DIAGNOSIS — D631 Anemia in chronic kidney disease: Secondary | ICD-10-CM | POA: Diagnosis not present

## 2021-03-06 ENCOUNTER — Ambulatory Visit (HOSPITAL_COMMUNITY): Payer: PPO | Attending: Surgery

## 2021-03-06 ENCOUNTER — Ambulatory Visit: Payer: PPO

## 2021-03-14 DIAGNOSIS — N185 Chronic kidney disease, stage 5: Secondary | ICD-10-CM | POA: Diagnosis not present

## 2021-03-14 DIAGNOSIS — D631 Anemia in chronic kidney disease: Secondary | ICD-10-CM | POA: Diagnosis not present

## 2021-03-27 DIAGNOSIS — Z79891 Long term (current) use of opiate analgesic: Secondary | ICD-10-CM | POA: Diagnosis not present

## 2021-03-27 DIAGNOSIS — Z9889 Other specified postprocedural states: Secondary | ICD-10-CM | POA: Diagnosis not present

## 2021-03-27 DIAGNOSIS — M47816 Spondylosis without myelopathy or radiculopathy, lumbar region: Secondary | ICD-10-CM | POA: Diagnosis not present

## 2021-03-27 DIAGNOSIS — M419 Scoliosis, unspecified: Secondary | ICD-10-CM | POA: Diagnosis not present

## 2021-03-27 DIAGNOSIS — G894 Chronic pain syndrome: Secondary | ICD-10-CM | POA: Diagnosis not present

## 2021-03-27 DIAGNOSIS — M5136 Other intervertebral disc degeneration, lumbar region: Secondary | ICD-10-CM | POA: Diagnosis not present

## 2021-03-29 ENCOUNTER — Ambulatory Visit: Payer: PPO | Admitting: Physician Assistant

## 2021-03-29 ENCOUNTER — Other Ambulatory Visit: Payer: Self-pay

## 2021-03-29 ENCOUNTER — Ambulatory Visit (HOSPITAL_COMMUNITY)
Admission: RE | Admit: 2021-03-29 | Discharge: 2021-03-29 | Disposition: A | Discharge status: Home / Self Care | Payer: PPO | Source: Ambulatory Visit | Attending: Vascular Surgery | Admitting: Vascular Surgery

## 2021-03-29 VITALS — BP 158/67 | HR 59 | Temp 97.6°F | Resp 20 | Ht 62.0 in | Wt 163.5 lb

## 2021-03-29 DIAGNOSIS — N185 Chronic kidney disease, stage 5: Secondary | ICD-10-CM

## 2021-03-29 DIAGNOSIS — I120 Hypertensive chronic kidney disease with stage 5 chronic kidney disease or end stage renal disease: Secondary | ICD-10-CM

## 2021-03-29 NOTE — Progress Notes (Signed)
HISTORY AND PHYSICAL     CC:  dialysis access Requesting Provider:  Nicoletta Dress, MD  HPI: This is a 75 y.o. female here for evaluation oh her hemodialysis access.  Pt has hx of right 1st stage BVT on 10/20/2020 by Dr. Carlis Abbott.  She has been seen a couple of times after surgery with duplex and fistula was not maturing.  Given she was not yet on HD, we did not want to do a fistulogram and give her contrast.  When she came in January, her fistula was maturing but still had an area of stenosis distally.  It was discussed with Dr. Oneida Alar and we decided to bring her back for duplex to re-evaluate.  She is here today for that appt.    She has hx of PAF and on Eliquis.  She has DM, HTN, COPD, CHF.  She is not yet on HD.  She has appt with Dr. Moshe Cipro on 5/3.  The pt is on a statin for cholesterol management.  The pt is not on a daily aspirin.  Other AC:  Eliquis The pt is on CCB, BB for hypertension.  The pt is diabetic.   Tobacco hx:  former  Past Medical History:  Diagnosis Date  . Acute encephalopathy 08/17/2013  . Anemia   . Anxiety   . Anxiety state, unspecified 08/17/2013  . ARF (acute renal failure) ([place]) 08/17/2013  . Arthritis   . Atrial fibrillation ([place])   . Bronchitis    hx of   . CHF (congestive heart failure) ([place])   . Chronic kidney disease   . Chronic pain syndrome 08/17/2013  . CKD stage 5 secondary to hypertension ([place]) 07/05/2020  . COPD (chronic obstructive pulmonary disease) ([place])   . Depression   . Diabetes ([place]) 08/17/2013  . Diabetes mellitus without complication ([place])    type 2  . Dysrhythmia    a-fib  . Early cataracts, bilateral   . Essential hypertension 08/17/2013  . GERD (gastroesophageal reflux disease)   . Headache   . HTN (hypertension) 08/17/2013  . Hyperlipidemia   . Hypertension   . Hypothyroidism   . OA (osteoarthritis) of hip 08/12/2013  . Paroxysmal atrial fibrillation ([place]) 04/19/2020  . Right carotid bruit   . S/P total hip arthroplasty  08/17/2013   Right. 08/12/13  . Shortness of breath    with exertion   . Unspecified hypothyroidism 08/17/2013  . Wears glasses     Past Surgical History:  Procedure Laterality Date  . ABDOMINAL HYSTERECTOMY    . AV FISTULA PLACEMENT Right 10/20/2020   Procedure: RIGHT ARM ARTERIOVENOUS (AV) FISTULA CREATION;  Surgeon: [name], MD;  Location: [place];  Service: Vascular;  Laterality: Right;  . BACK SURGERY    . CHOLECYSTECTOMY    . COLONOSCOPY    . NECK SURGERY    . TOTAL HIP ARTHROPLASTY Right 08/12/2013   Procedure: RIGHT TOTAL HIP ARTHROPLASTY ANTERIOR APPROACH;  Surgeon: [name], MD;  [facility];  Service: Orthopedics;  Laterality: Right;    Allergies  Allergen Reactions  . Aspirin     Upset stomach   . Niacin And Related Hives    Current Outpatient Medications  Medication Sig Dispense Refill  . albuterol (VENTOLIN HFA) 108 (90 Base) MCG/ACT inhaler Inhale 2 puffs into the lungs every 6 (six) hours as needed for wheezing.    Marland Kitchen amLODipine (NORVASC) 5 MG tablet Take 5 mg by mouth daily.    . calcitRIOL (ROCALTROL) 0.25  MCG capsule Take 0.25 mcg by mouth 2 (two) times daily.    . cetirizine (ZYRTEC) 10 MG tablet Take 10 mg by mouth at bedtime.    Marland Kitchen ELIQUIS 5 MG TABS tablet Take 5 mg by mouth 2 (two) times daily.   12  . FREESTYLE LITE test strip 4 (four) times daily.    . furosemide (LASIX) 80 MG tablet Take 240 mg by mouth daily.     . insulin aspart (NOVOLOG) 100 UNIT/ML injection Inject 15 Units into the skin 3 (three) times daily before meals.     Marland Kitchen labetalol (NORMODYNE) 300 MG tablet Take 300 mg by mouth 2 (two) times daily.   12  . Lancets (FREESTYLE) lancets 1 each 2 (two) times daily.    Marland Kitchen LEVEMIR FLEXTOUCH 100 UNIT/ML FlexPen Inject 65 Units into the skin at bedtime.    Marland Kitchen levothyroxine (SYNTHROID, LEVOTHROID) 50 MCG tablet Take 50 mcg by mouth daily before breakfast.    . mirtazapine (REMERON) 15 MG tablet Take 15 mg by mouth at bedtime.    Marland Kitchen  omega-3 acid ethyl esters (LOVAZA) 1 g capsule Take 1 g by mouth 2 (two) times daily.     Marland Kitchen omeprazole (PRILOSEC) 20 MG capsule Take 20 mg by mouth daily.     Marland Kitchen oxyCODONE-acetaminophen (PERCOCET/ROXICET) 5-325 MG tablet Take 1 tablet by mouth every 6 (six) hours as needed for severe pain. 10 tablet 0  . ranolazine (RANEXA) 500 MG 12 hr tablet Take 500 mg by mouth 2 (two) times daily.     . rosuvastatin (CRESTOR) 40 MG tablet Take 40 mg by mouth every evening.     . sertraline (ZOLOFT) 100 MG tablet Take 100 mg by mouth every evening.     No current facility-administered medications for this visit.   Facility-Administered Medications Ordered in Other Visits  Medication Dose Route Frequency Provider Last Rate Last Admin  . hydrALAZINE (APRESOLINE) injection   Intravenous Anesthesia Intra-op Genelle Bal, CRNA   10 mg at 08/11/20 1400    Family History  Problem Relation Age of Onset  . Transient ischemic attack Mother   . Heart disease Mother     Social History   Socioeconomic History  . Marital status: Married    Spouse name: Not on file  . Number of children: Not on file  . Years of education: Not on file  . Highest education level: Not on file  Occupational History  . Not on file  Tobacco Use  . Smoking status: Former Smoker    Packs/day: 0.25    Types: Cigarettes  . Smokeless tobacco: Never Used  Vaping Use  . Vaping Use: Never used  Substance and Sexual Activity  . Alcohol use: No  . Drug use: No  . Sexual activity: Not Currently    Birth control/protection: Surgical    Comment: Hysterectomy  Other Topics Concern  . Not on file  Social History Narrative  . Not on file   Social Determinants of Health   Financial Resource Strain: Not on file  Food Insecurity: Not on file  Transportation Needs: Not on file  Physical Activity: Not on file  Stress: Not on file  Social Connections: Not on file  Intimate Partner Violence: Not on file     ROS: $Rem'[x]'SJIL$   Positive   '[ ]'$  Negative   '[ ]'$  All sytems reviewed and are negative  Cardiac: $RemoveB'[x]'qSjPxdLQ$  PAF  Vascular: $RemoveBe'[]'bftOjIzID$  pain in legs while walking $RemoveBefor'[]'hfpcWQMUgdAf$  pain in feet when lying  flat '[]'$  hx of DVT $Remo'[]'QskFg$  swelling in legs  Pulmonary: $RemoveBef'[x]'xdxqjDGkmI$  COPD  Neurologic: $RemoveBefo'[]'YBPGsnObTTy$  weakness in $RemoveBef'[]'jmzxWGdEbl$  arms $Rem'[]'xjsj$  legs $Rem'[]'zgGM$  numbness in $RemoveBef'[]'rhqbiWQsjS$  arms $Rem'[]'niIE$  legs $Rem'[]'QHFZ$ difficulty speaking or slurred speech  Hematologic: $RemoveBefor'[]'FmeAZOclVpjP$  bleeding problems  GI $R'[x]'lG$  GERD  GU: $Re'[x]'REC$  CKD/renal failure  $Remove'[]'KLSqMPj$  HD-'[]'$  M/W/F $Remo'[]'OLekQ$  T/T/S  Psychiatric: $RemoveBefor'[x]'bxWzTgTcqdkp$  anxiety   Integumentary: $RemoveBeforeD'[]'CAdRtnIxyBgyxL$  rashes $Remov'[]'xenpTz$  ulcers  Constitutional: $RemoveBeforeDE'[]'MCGHYkwxVtqubXQ$  fever $Remo'[]'yOnvL$  chills   PHYSICAL EXAMINATION:  Today's Vitals   03/29/21 1326  BP: (!) 158/67  Pulse: (!) 59  Resp: 20  Temp: 97.6 F (36.4 C)  TempSrc: Temporal  SpO2: 98%  Weight: 163 lb 8 oz (74.2 kg)  Height: $Remove'5\' 2"'XSKORuY$  (1.575 m)   Body mass index is 29.9 kg/m.    General:  WDWN female in NAD Gait: Normal HENT: WNL Pulmonary: normal non-labored breathing , without Rales, rhonchi,  wheezing Cardiac: regular Skin: without rashes Vascular Exam/Pulses:   Right Left  Radial 2+ (normal) 2+ (normal)   Extremities: fistula is pulsatile at antecubital fossa, good thrill more proximally. Musculoskeletal: no muscle wasting or atrophy  Neurologic: A&O X 3; Speech is fluent/normal  Non-Invasive Vascular Imaging:   Upper Extremity Vein Mapping on 03/29/2021: Findings:  +--------------------+----------+-----------------+--------+  AVF         PSV (cm/s)Flow Vol (mL/min)Comments  +--------------------+----------+-----------------+--------+  Native artery inflow  168      474          +--------------------+----------+-----------------+--------+  AVF Anastomosis     239                 +--------------------+----------+-----------------+--------+     +------------+----------+-------------+----------+-------------------------  ----+  OUTFLOW VEINPSV (cm/s)Diameter (cm)Depth (cm)     Describe      +------------+----------+-------------+----------+-------------------------  Prox UA     73    0.55     2.07   post stenotic  turbulence    +------------+----------+-------------+----------+-------------------------  Mid UA     77    0.61    178.00  competing branch and  Post stenotic turbulence  +------------+----------+-------------+----------+-------------------------  Dist UA     612    0.13     1.48    change in Diameter  And stenotic     +------------+----------+-------------+----------+-------------------------  AC Fossa    237    0.42     0.74                +------------+----------+-------------+----------+-------------------------    ASSESSMENT/PLAN: 75 y.o. female with CKD here for evaluation of her hemodialysis access with hx of right 1st stage BVT on 10/20/2020 by Dr. Carlis Abbott.  -we have been following pt with duplex since the fistula was slow to mature.  She did have a stenotic area on previous study but this is worse today.  It is pulsatile at the antecubital fossa but has good thrill more proximally.  I discussed results with Dr. Scot Dock and discussed proceeding with 2nd stage and possibly doing vein patch angioplasty on stenotic area.  I discussed this with pt and her husband and she seems a little hesitant to proceed.  Given this, I will have her return on 5/3 to see Dr. Carlis Abbott.  She lives in [place] and will be seeing Dr. Moshe Cipro that day, so we will have her see Dr. Carlis Abbott as well to decide on how to proceed.  Would not want to proceed with fistulogram at this time as she is not yet on HD.   Pt and her husband are in agreement with this plan.  -pt is on  Eliquis    Leontine Locket, [facility] [phone]  Clinic MD:   Scot Dock

## 2021-04-03 DIAGNOSIS — D631 Anemia in chronic kidney disease: Secondary | ICD-10-CM | POA: Diagnosis not present

## 2021-04-03 DIAGNOSIS — N185 Chronic kidney disease, stage 5: Secondary | ICD-10-CM | POA: Diagnosis not present

## 2021-04-06 DIAGNOSIS — E1142 Type 2 diabetes mellitus with diabetic polyneuropathy: Secondary | ICD-10-CM | POA: Diagnosis not present

## 2021-04-06 DIAGNOSIS — E785 Hyperlipidemia, unspecified: Secondary | ICD-10-CM | POA: Diagnosis not present

## 2021-04-06 DIAGNOSIS — E039 Hypothyroidism, unspecified: Secondary | ICD-10-CM | POA: Diagnosis not present

## 2021-04-06 DIAGNOSIS — Z7689 Persons encountering health services in other specified circumstances: Secondary | ICD-10-CM | POA: Diagnosis not present

## 2021-04-06 DIAGNOSIS — Z6830 Body mass index (BMI) 30.0-30.9, adult: Secondary | ICD-10-CM | POA: Diagnosis not present

## 2021-04-06 DIAGNOSIS — I120 Hypertensive chronic kidney disease with stage 5 chronic kidney disease or end stage renal disease: Secondary | ICD-10-CM | POA: Diagnosis not present

## 2021-04-06 DIAGNOSIS — N185 Chronic kidney disease, stage 5: Secondary | ICD-10-CM | POA: Diagnosis not present

## 2021-04-06 DIAGNOSIS — I4891 Unspecified atrial fibrillation: Secondary | ICD-10-CM | POA: Diagnosis not present

## 2021-04-06 DIAGNOSIS — D638 Anemia in other chronic diseases classified elsewhere: Secondary | ICD-10-CM | POA: Diagnosis not present

## 2021-04-06 DIAGNOSIS — E1165 Type 2 diabetes mellitus with hyperglycemia: Secondary | ICD-10-CM | POA: Diagnosis not present

## 2021-04-06 DIAGNOSIS — F3341 Major depressive disorder, recurrent, in partial remission: Secondary | ICD-10-CM | POA: Diagnosis not present

## 2021-04-18 ENCOUNTER — Encounter: Payer: Self-pay | Admitting: Vascular Surgery

## 2021-04-18 ENCOUNTER — Ambulatory Visit: Payer: PPO | Admitting: Vascular Surgery

## 2021-04-18 ENCOUNTER — Other Ambulatory Visit: Payer: Self-pay

## 2021-04-18 VITALS — BP 169/71 | HR 52 | Temp 97.3°F | Resp 14 | Ht 62.0 in | Wt 161.0 lb

## 2021-04-18 DIAGNOSIS — I120 Hypertensive chronic kidney disease with stage 5 chronic kidney disease or end stage renal disease: Secondary | ICD-10-CM

## 2021-04-18 DIAGNOSIS — G8929 Other chronic pain: Secondary | ICD-10-CM | POA: Diagnosis not present

## 2021-04-18 DIAGNOSIS — I129 Hypertensive chronic kidney disease with stage 1 through stage 4 chronic kidney disease, or unspecified chronic kidney disease: Secondary | ICD-10-CM | POA: Diagnosis not present

## 2021-04-18 DIAGNOSIS — D631 Anemia in chronic kidney disease: Secondary | ICD-10-CM | POA: Diagnosis not present

## 2021-04-18 DIAGNOSIS — N2581 Secondary hyperparathyroidism of renal origin: Secondary | ICD-10-CM | POA: Diagnosis not present

## 2021-04-18 DIAGNOSIS — N185 Chronic kidney disease, stage 5: Secondary | ICD-10-CM | POA: Diagnosis not present

## 2021-04-18 DIAGNOSIS — N183 Chronic kidney disease, stage 3 unspecified: Secondary | ICD-10-CM | POA: Diagnosis not present

## 2021-04-18 NOTE — Progress Notes (Signed)
Patient name: Robin Brewer MRN: [id number] DOB: 1946/01/11 Sex: female  REASON FOR VISIT: Follow-up to discuss right first stage basilic vein fistula that has not matured  HPI: Robin Brewer is a 75 y.o. female with stage V CKD that presents for follow-up to discuss right first stage basilic vein fistula that has not matured.  Her fistula was previously placed on 10/20/2020 by myself.  On subsequent follow-up she has had a very tight stenosis in the fistula in the distal upper arm.  The fistula has not been transposed.  She is not on dialysis at this time.  States she has an appointment with Dr. Kathrene Bongo this afternoon.  She is right-handed.  The reason we placed a fistula in her right arm initially was the right basilic vein appeared to be only usable surface vein at the time of initial evaluation.  Past Medical History:  Diagnosis Date  . Acute encephalopathy 08/17/2013  . Anemia   . Anxiety   . Anxiety state, unspecified 08/17/2013  . ARF (acute renal failure) (HCC) 08/17/2013  . Arthritis   . Atrial fibrillation (HCC)   . Bronchitis    hx of   . CHF (congestive heart failure) (HCC)   . Chronic kidney disease   . Chronic pain syndrome 08/17/2013  . CKD stage 5 secondary to hypertension (HCC) 07/05/2020  . COPD (chronic obstructive pulmonary disease) (HCC)   . Depression   . Diabetes (HCC) 08/17/2013  . Diabetes mellitus without complication (HCC)    type 2  . Dysrhythmia    a-fib  . Early cataracts, bilateral   . Essential hypertension 08/17/2013  . GERD (gastroesophageal reflux disease)   . Headache   . HTN (hypertension) 08/17/2013  . Hyperlipidemia   . Hypertension   . Hypothyroidism   . OA (osteoarthritis) of hip 08/12/2013  . Paroxysmal atrial fibrillation (HCC) 04/19/2020  . Right carotid bruit   . S/P total hip arthroplasty 08/17/2013   Right. 08/12/13  . Shortness of breath    with exertion   . Unspecified hypothyroidism 08/17/2013  . Wears glasses     Past Surgical  History:  Procedure Laterality Date  . ABDOMINAL HYSTERECTOMY    . AV FISTULA PLACEMENT Right 10/20/2020   Procedure: RIGHT ARM ARTERIOVENOUS (AV) FISTULA CREATION;  Surgeon: Cephus Shelling, MD;  Location: [facility];  Service: Vascular;  Laterality: Right;  . BACK SURGERY    . CHOLECYSTECTOMY    . COLONOSCOPY    . NECK SURGERY    . TOTAL HIP ARTHROPLASTY Right 08/12/2013   Procedure: RIGHT TOTAL HIP ARTHROPLASTY ANTERIOR APPROACH;  Surgeon: Loanne Drilling, MD;  Location: [facility];  Service: Orthopedics;  Laterality: Right;    Family History  Problem Relation Age of Onset  . Transient ischemic attack Mother   . Heart disease Mother     SOCIAL HISTORY: Social History   Tobacco Use  . Smoking status: Former Smoker    Packs/day: 0.25    Types: Cigarettes  . Smokeless tobacco: Never Used  Substance Use Topics  . Alcohol use: No    Allergies  Allergen Reactions  . Aspirin     Upset stomach   . Niacin And Related Hives    Current Outpatient Medications  Medication Sig Dispense Refill  . albuterol (VENTOLIN HFA) 108 (90 Base) MCG/ACT inhaler Inhale 2 puffs into the lungs every 6 (six) hours as needed for wheezing.    Marland Kitchen amLODipine (NORVASC) 5 MG tablet Take 5 mg by  mouth daily.    . calcitRIOL (ROCALTROL) 0.25 MCG capsule Take 0.25 mcg by mouth 2 (two) times daily.    . cetirizine (ZYRTEC) 10 MG tablet Take 10 mg by mouth at bedtime.    Marland Kitchen ELIQUIS 5 MG TABS tablet Take 5 mg by mouth 2 (two) times daily.   12  . FREESTYLE LITE test strip 4 (four) times daily.    . furosemide (LASIX) 80 MG tablet Take 240 mg by mouth daily.     . insulin aspart (NOVOLOG) 100 UNIT/ML injection Inject 15 Units into the skin 3 (three) times daily before meals.     Marland Kitchen labetalol (NORMODYNE) 300 MG tablet Take 300 mg by mouth 2 (two) times daily.   12  . Lancets (FREESTYLE) lancets 1 each 2 (two) times daily.    Marland Kitchen LEVEMIR FLEXTOUCH 100 UNIT/ML FlexPen Inject 65 Units into the skin at bedtime.    Marland Kitchen  levothyroxine (SYNTHROID, LEVOTHROID) 50 MCG tablet Take 50 mcg by mouth daily before breakfast.    . mirtazapine (REMERON) 15 MG tablet Take 15 mg by mouth at bedtime.    Marland Kitchen omega-3 acid ethyl esters (LOVAZA) 1 g capsule Take 1 g by mouth 2 (two) times daily.     Marland Kitchen omeprazole (PRILOSEC) 20 MG capsule Take 20 mg by mouth daily.     Marland Kitchen oxyCODONE-acetaminophen (PERCOCET/ROXICET) 5-325 MG tablet Take 1 tablet by mouth every 6 (six) hours as needed for severe pain. 10 tablet 0  . ranolazine (RANEXA) 500 MG 12 hr tablet Take 500 mg by mouth 2 (two) times daily.     . rosuvastatin (CRESTOR) 40 MG tablet Take 40 mg by mouth every evening.     . sertraline (ZOLOFT) 100 MG tablet Take 100 mg by mouth every evening.     No current facility-administered medications for this visit.   Facility-Administered Medications Ordered in Other Visits  Medication Dose Route Frequency Provider Last Rate Last Admin  . hydrALAZINE (APRESOLINE) injection   Intravenous Anesthesia Intra-op Genelle Bal, CRNA   10 mg at 08/11/20 1400    REVIEW OF SYSTEMS:  $RemoveB'[X]'ovriBGya$  denotes positive finding, $RemoveBeforeDEI'[ ]'QZTADMgmGOWkXOTl$  denotes negative finding Cardiac  Comments:  Chest pain or chest pressure:    Shortness of breath upon exertion:    Short of breath when lying flat:    Irregular heart rhythm:        Vascular    Pain in calf, thigh, or hip brought on by ambulation:    Pain in feet at night that wakes you up from your sleep:     Blood clot in your veins:    Leg swelling:         Pulmonary    Oxygen at home:    Productive cough:     Wheezing:         Neurologic    Sudden weakness in arms or legs:     Sudden numbness in arms or legs:     Sudden onset of difficulty speaking or slurred speech:    Temporary loss of vision in one eye:     Problems with dizziness:         Gastrointestinal    Blood in stool:     Vomited blood:         Genitourinary    Burning when urinating:     Blood in urine:        Psychiatric    Major  depression:  Hematologic    Bleeding problems:    Problems with blood clotting too easily:        Skin    Rashes or ulcers:        Constitutional    Fever or chills:      PHYSICAL EXAM: Vitals:   04/18/21 1130  BP: (!) 169/71  Pulse: (!) 52  Resp: 14  Temp: (!) 97.3 F (36.3 C)  TempSrc: Temporal  SpO2: 98%  Weight: 161 lb (73 kg)  Height: $Remove'5\' 2"'MEywOWy$  (1.575 m)    GENERAL: The patient is a well-nourished female, in no acute distress. The vital signs are documented above. CARDIAC: There is a regular rate and rhythm.  VASCULAR:  Right basilic vein fistula feels pulsatile, no thrill Right radial pulse palpable   DATA:   Fistula duplex 03/29/2021 shows a high-grade stenosis in the distal upper arm where the vein only measures about 1 mm and is also small near the antecubital fossa measuring 4 mm.  Assessment/Plan:  75 year old female with stage V CKD that presents for evaluation of a right arm first stage basilic vein fistula that has failed to mature.  On exam this is pulsatile and discussed normally would recommend fistulogram for further evaluation but given she is not on dialysis I would not recommend exposure to contrast with fistulogram to risk putting her on dialysis. My feeling is that this is likely not going to work given it is severely stenotic and only measures 1 mm in the distal upper arm.  I looked at her left basilic vein with SonoSite and this does not look usable.  The left cephalic vein is borderline.  Discussed that we could attempt either left cephalic vein based fistula versus AV graft.  Would likely favor this when she is closer to needing dialysis given a high likelihood for graft placement.  She has an appointment with Dr. Moshe Cipro this afternoon and discussed that she let us know if she wants to schedule after discussion with her nephrologist.   [name], MD [facility] Office: [phone]

## 2021-04-20 DIAGNOSIS — D631 Anemia in chronic kidney disease: Secondary | ICD-10-CM | POA: Diagnosis not present

## 2021-04-20 DIAGNOSIS — N185 Chronic kidney disease, stage 5: Secondary | ICD-10-CM | POA: Diagnosis not present

## 2021-05-01 DIAGNOSIS — G894 Chronic pain syndrome: Secondary | ICD-10-CM | POA: Diagnosis not present

## 2021-05-01 DIAGNOSIS — Z9889 Other specified postprocedural states: Secondary | ICD-10-CM | POA: Diagnosis not present

## 2021-05-01 DIAGNOSIS — M419 Scoliosis, unspecified: Secondary | ICD-10-CM | POA: Diagnosis not present

## 2021-05-01 DIAGNOSIS — M47816 Spondylosis without myelopathy or radiculopathy, lumbar region: Secondary | ICD-10-CM | POA: Diagnosis not present

## 2021-05-01 DIAGNOSIS — M5136 Other intervertebral disc degeneration, lumbar region: Secondary | ICD-10-CM | POA: Diagnosis not present

## 2021-05-01 DIAGNOSIS — Z79891 Long term (current) use of opiate analgesic: Secondary | ICD-10-CM | POA: Diagnosis not present

## 2021-05-11 DIAGNOSIS — N185 Chronic kidney disease, stage 5: Secondary | ICD-10-CM | POA: Diagnosis not present

## 2021-05-11 DIAGNOSIS — D631 Anemia in chronic kidney disease: Secondary | ICD-10-CM | POA: Diagnosis not present

## 2021-05-29 DIAGNOSIS — M47816 Spondylosis without myelopathy or radiculopathy, lumbar region: Secondary | ICD-10-CM | POA: Diagnosis not present

## 2021-05-29 DIAGNOSIS — G894 Chronic pain syndrome: Secondary | ICD-10-CM | POA: Diagnosis not present

## 2021-05-29 DIAGNOSIS — Z79891 Long term (current) use of opiate analgesic: Secondary | ICD-10-CM | POA: Diagnosis not present

## 2021-05-29 DIAGNOSIS — Z9889 Other specified postprocedural states: Secondary | ICD-10-CM | POA: Diagnosis not present

## 2021-05-29 DIAGNOSIS — M5136 Other intervertebral disc degeneration, lumbar region: Secondary | ICD-10-CM | POA: Diagnosis not present

## 2021-05-29 DIAGNOSIS — M419 Scoliosis, unspecified: Secondary | ICD-10-CM | POA: Diagnosis not present

## 2021-06-01 DIAGNOSIS — D631 Anemia in chronic kidney disease: Secondary | ICD-10-CM | POA: Diagnosis not present

## 2021-06-01 DIAGNOSIS — N185 Chronic kidney disease, stage 5: Secondary | ICD-10-CM | POA: Diagnosis not present

## 2021-06-08 DIAGNOSIS — N189 Chronic kidney disease, unspecified: Secondary | ICD-10-CM | POA: Diagnosis not present

## 2021-06-08 DIAGNOSIS — D631 Anemia in chronic kidney disease: Secondary | ICD-10-CM | POA: Diagnosis not present

## 2021-06-22 DIAGNOSIS — N185 Chronic kidney disease, stage 5: Secondary | ICD-10-CM | POA: Diagnosis not present

## 2021-06-22 DIAGNOSIS — D631 Anemia in chronic kidney disease: Secondary | ICD-10-CM | POA: Diagnosis not present

## 2021-06-27 ENCOUNTER — Telehealth: Payer: Self-pay

## 2021-06-27 DIAGNOSIS — Z79891 Long term (current) use of opiate analgesic: Secondary | ICD-10-CM | POA: Diagnosis not present

## 2021-06-27 DIAGNOSIS — Z1389 Encounter for screening for other disorder: Secondary | ICD-10-CM | POA: Diagnosis not present

## 2021-06-27 DIAGNOSIS — M5136 Other intervertebral disc degeneration, lumbar region: Secondary | ICD-10-CM | POA: Diagnosis not present

## 2021-06-27 DIAGNOSIS — M47816 Spondylosis without myelopathy or radiculopathy, lumbar region: Secondary | ICD-10-CM | POA: Diagnosis not present

## 2021-06-27 DIAGNOSIS — M419 Scoliosis, unspecified: Secondary | ICD-10-CM | POA: Diagnosis not present

## 2021-06-27 DIAGNOSIS — G894 Chronic pain syndrome: Secondary | ICD-10-CM | POA: Diagnosis not present

## 2021-06-27 DIAGNOSIS — Z9889 Other specified postprocedural states: Secondary | ICD-10-CM | POA: Diagnosis not present

## 2021-06-27 NOTE — Telephone Encounter (Signed)
Contacted pt to discuss surgery scheduling for left arm AVF vs graft- stated per Dr. Moshe Cipro dialysis is not needed at this time.

## 2021-07-10 DIAGNOSIS — E039 Hypothyroidism, unspecified: Secondary | ICD-10-CM | POA: Diagnosis not present

## 2021-07-10 DIAGNOSIS — I120 Hypertensive chronic kidney disease with stage 5 chronic kidney disease or end stage renal disease: Secondary | ICD-10-CM | POA: Diagnosis not present

## 2021-07-10 DIAGNOSIS — I4891 Unspecified atrial fibrillation: Secondary | ICD-10-CM | POA: Diagnosis not present

## 2021-07-10 DIAGNOSIS — F3341 Major depressive disorder, recurrent, in partial remission: Secondary | ICD-10-CM | POA: Diagnosis not present

## 2021-07-10 DIAGNOSIS — E785 Hyperlipidemia, unspecified: Secondary | ICD-10-CM | POA: Diagnosis not present

## 2021-07-10 DIAGNOSIS — E1142 Type 2 diabetes mellitus with diabetic polyneuropathy: Secondary | ICD-10-CM | POA: Diagnosis not present

## 2021-07-10 DIAGNOSIS — N185 Chronic kidney disease, stage 5: Secondary | ICD-10-CM | POA: Diagnosis not present

## 2021-07-10 DIAGNOSIS — D638 Anemia in other chronic diseases classified elsewhere: Secondary | ICD-10-CM | POA: Diagnosis not present

## 2021-07-13 DIAGNOSIS — N185 Chronic kidney disease, stage 5: Secondary | ICD-10-CM | POA: Diagnosis not present

## 2021-07-13 DIAGNOSIS — D631 Anemia in chronic kidney disease: Secondary | ICD-10-CM | POA: Diagnosis not present

## 2021-07-25 DIAGNOSIS — N183 Chronic kidney disease, stage 3 unspecified: Secondary | ICD-10-CM | POA: Diagnosis not present

## 2021-07-25 DIAGNOSIS — I129 Hypertensive chronic kidney disease with stage 1 through stage 4 chronic kidney disease, or unspecified chronic kidney disease: Secondary | ICD-10-CM | POA: Diagnosis not present

## 2021-07-25 DIAGNOSIS — N2581 Secondary hyperparathyroidism of renal origin: Secondary | ICD-10-CM | POA: Diagnosis not present

## 2021-07-25 DIAGNOSIS — G8929 Other chronic pain: Secondary | ICD-10-CM | POA: Diagnosis not present

## 2021-07-25 DIAGNOSIS — D631 Anemia in chronic kidney disease: Secondary | ICD-10-CM | POA: Diagnosis not present

## 2021-07-28 DIAGNOSIS — Z1389 Encounter for screening for other disorder: Secondary | ICD-10-CM | POA: Diagnosis not present

## 2021-07-28 DIAGNOSIS — Z79891 Long term (current) use of opiate analgesic: Secondary | ICD-10-CM | POA: Diagnosis not present

## 2021-07-28 DIAGNOSIS — M419 Scoliosis, unspecified: Secondary | ICD-10-CM | POA: Diagnosis not present

## 2021-07-28 DIAGNOSIS — M47816 Spondylosis without myelopathy or radiculopathy, lumbar region: Secondary | ICD-10-CM | POA: Diagnosis not present

## 2021-07-28 DIAGNOSIS — M5136 Other intervertebral disc degeneration, lumbar region: Secondary | ICD-10-CM | POA: Diagnosis not present

## 2021-07-28 DIAGNOSIS — G894 Chronic pain syndrome: Secondary | ICD-10-CM | POA: Diagnosis not present

## 2021-07-28 DIAGNOSIS — Z9889 Other specified postprocedural states: Secondary | ICD-10-CM | POA: Diagnosis not present

## 2021-08-01 ENCOUNTER — Encounter: Payer: Self-pay | Admitting: Cardiology

## 2021-08-01 ENCOUNTER — Other Ambulatory Visit: Payer: Self-pay

## 2021-08-01 ENCOUNTER — Ambulatory Visit: Payer: PPO | Admitting: Cardiology

## 2021-08-01 VITALS — BP 142/76 | HR 57 | Ht 62.0 in | Wt 164.4 lb

## 2021-08-01 DIAGNOSIS — N185 Chronic kidney disease, stage 5: Secondary | ICD-10-CM

## 2021-08-01 DIAGNOSIS — E119 Type 2 diabetes mellitus without complications: Secondary | ICD-10-CM | POA: Diagnosis not present

## 2021-08-01 DIAGNOSIS — I10 Essential (primary) hypertension: Secondary | ICD-10-CM | POA: Diagnosis not present

## 2021-08-01 DIAGNOSIS — I480 Paroxysmal atrial fibrillation: Secondary | ICD-10-CM | POA: Diagnosis not present

## 2021-08-01 DIAGNOSIS — I120 Hypertensive chronic kidney disease with stage 5 chronic kidney disease or end stage renal disease: Secondary | ICD-10-CM | POA: Diagnosis not present

## 2021-08-01 DIAGNOSIS — E782 Mixed hyperlipidemia: Secondary | ICD-10-CM

## 2021-08-01 NOTE — Patient Instructions (Signed)

## 2021-08-01 NOTE — Progress Notes (Signed)
Cardiology Office Note:    Date:  08/01/2021   ID:  Robin Brewer, DOB July 10, 1946, MRN [id number]  PCP:  Nicoletta Dress, MD  Cardiologist:  Jenne Campus, MD    Referring MD: Nicoletta Dress, MD   No chief complaint on file. I am doing fine cardiac wise very rare palpitations  History of Present Illness:    Robin Brewer is a 75 y.o. female with past medical history significant for paroxysmal atrial fibrillation, she is anticoagulated, COPD, essential hypertension, diabetes, peripheral vascular disease in form of carotic arterial disease which is noncritical based on test from 2019, chronic kidney failure with creatinine now in the neighborhood of 6. She comes today to my office for follow-up overall cardiac wise doing well.  Denies have any palpitations, no chest pain tightness squeezing pressure burning chest.  She complained of the fact that she have to have very frequent visits in different doctors he required frequent Epogen and injections blood transfusion and iron infusions she is very tired of that.  Past Medical History:  Diagnosis Date   Acute encephalopathy 08/17/2013   Anemia    Anxiety    Anxiety state, unspecified 08/17/2013   ARF (acute renal failure) ([place]) 08/17/2013   Arthritis    Atrial fibrillation (HCC)    Bronchitis    hx of    CHF (congestive heart failure) ([place])    Chronic kidney disease    Chronic pain syndrome 08/17/2013   CKD stage 5 secondary to hypertension ([place]) 07/05/2020   COPD (chronic obstructive pulmonary disease) ([place])    Depression    Diabetes ([place]) 08/17/2013   Diabetes mellitus without complication (HCC)    type 2   Dysrhythmia    a-fib   Early cataracts, bilateral    Essential hypertension 08/17/2013   GERD (gastroesophageal reflux disease)    Headache    HTN (hypertension) 08/17/2013   Hyperlipidemia    Hypertension    Hypothyroidism    OA (osteoarthritis) of hip 08/12/2013   Paroxysmal atrial fibrillation ([place]) 04/19/2020   Right  carotid bruit    S/P total hip arthroplasty 08/17/2013   Right. 08/12/13   Shortness of breath    with exertion    Unspecified hypothyroidism 08/17/2013   Wears glasses     Past Surgical History:  Procedure Laterality Date   ABDOMINAL HYSTERECTOMY     AV FISTULA PLACEMENT Right 10/20/2020   Procedure: RIGHT ARM ARTERIOVENOUS (AV) FISTULA CREATION;  Surgeon: [name], MD;  Location: [facility];  Service: Vascular;  Laterality: Right;   BACK SURGERY     CHOLECYSTECTOMY     COLONOSCOPY     NECK SURGERY     TOTAL HIP ARTHROPLASTY Right 08/12/2013   Procedure: RIGHT TOTAL HIP ARTHROPLASTY ANTERIOR APPROACH;  Surgeon: [name], MD;  Location: [facility];  Service: Orthopedics;  Laterality: Right;    Current Medications: Current Meds  Medication Sig   albuterol (VENTOLIN HFA) 108 (90 Base) MCG/ACT inhaler Inhale 2 puffs into the lungs every 6 (six) hours as needed for wheezing.   amLODipine (NORVASC) 5 MG tablet Take 5 mg by mouth daily.   calcitRIOL (ROCALTROL) 0.25 MCG capsule Take 0.25 mcg by mouth 2 (two) times daily.   cetirizine (ZYRTEC) 10 MG tablet Take 10 mg by mouth at bedtime.   ELIQUIS 5 MG TABS tablet Take 5 mg by mouth 2 (two) times daily.    FREESTYLE LITE test strip 1 each by Other route as needed for  other (Glucose reading).   furosemide (LASIX) 80 MG tablet Take 240 mg by mouth daily.    insulin aspart (NOVOLOG) 100 UNIT/ML injection Inject 15 Units into the skin 3 (three) times daily before meals.    labetalol (NORMODYNE) 300 MG tablet Take 300 mg by mouth 2 (two) times daily.    Lancets (FREESTYLE) lancets 1 each by Other route as needed for other (Glucose reading).   LEVEMIR FLEXTOUCH 100 UNIT/ML FlexPen Inject 50 Units into the skin at bedtime.   levothyroxine (SYNTHROID, LEVOTHROID) 50 MCG tablet Take 50 mcg by mouth daily before breakfast.   mirtazapine (REMERON) 15 MG tablet Take 15 mg by mouth at bedtime.   omega-3 acid ethyl esters (LOVAZA) 1 g capsule  Take 1 g by mouth 2 (two) times daily.    omeprazole (PRILOSEC) 20 MG capsule Take 20 mg by mouth daily.    oxyCODONE-acetaminophen (PERCOCET/ROXICET) 5-325 MG tablet Take 1 tablet by mouth every 6 (six) hours as needed for severe pain.   ranolazine (RANEXA) 500 MG 12 hr tablet Take 500 mg by mouth 2 (two) times daily.    rosuvastatin (CRESTOR) 40 MG tablet Take 40 mg by mouth every evening.    sertraline (ZOLOFT) 100 MG tablet Take 100 mg by mouth every evening.     Allergies:   Aspirin and Niacin and related   Social History   Socioeconomic History   Marital status: Married    Spouse name: Not on file   Number of children: Not on file   Years of education: Not on file   Highest education level: Not on file  Occupational History   Not on file  Tobacco Use   Smoking status: Former    Packs/day: 0.25    Types: Cigarettes   Smokeless tobacco: Never  Vaping Use   Vaping Use: Never used  Substance and Sexual Activity   Alcohol use: No   Drug use: No   Sexual activity: Not Currently    Birth control/protection: Surgical    Comment: Hysterectomy  Other Topics Concern   Not on file  Social History Narrative   Not on file   Social Determinants of Health   Financial Resource Strain: Not on file  Food Insecurity: Not on file  Transportation Needs: Not on file  Physical Activity: Not on file  Stress: Not on file  Social Connections: Not on file     Family History: The patient's family history includes Heart disease in her mother; Transient ischemic attack in her mother. ROS:   Please see the history of present illness.    All 14 point review of systems negative except as described per history of present illness  EKGs/Labs/Other Studies Reviewed:      Recent Labs: 10/20/2020: BUN 53; Creatinine, Ser 5.30; Hemoglobin 11.6; Potassium 3.7; Sodium 141  Recent Lipid Panel No results found for: CHOL, TRIG, HDL, CHOLHDL, VLDL, LDLCALC, LDLDIRECT  Physical Exam:    VS:  BP  (!) 142/76 (BP Location: Right Arm, Patient Position: Sitting)   Pulse (!) 57   Ht $R'5\' 2"'KW$  (1.575 m)   Wt 164 lb 6.4 oz (74.6 kg)   SpO2 96%   BMI 30.07 kg/m     Wt Readings from Last 3 Encounters:  08/01/21 164 lb 6.4 oz (74.6 kg)  04/18/21 161 lb (73 kg)  03/29/21 163 lb 8 oz (74.2 kg)     GEN:  Well nourished, well developed in no acute distress HEENT: Normal NECK: No JVD; No carotid  bruits LYMPHATICS: No lymphadenopathy CARDIAC: RRR, no murmurs, no rubs, no gallops RESPIRATORY:  Clear to auscultation without rales, wheezing or rhonchi  ABDOMEN: Soft, non-tender, non-distended MUSCULOSKELETAL:  No edema; No deformity  SKIN: Warm and dry LOWER EXTREMITIES: no swelling NEUROLOGIC:  Alert and oriented x 3 PSYCHIATRIC:  Normal affect   ASSESSMENT:    1. Paroxysmal atrial fibrillation (HCC)   2. Primary hypertension   3. Diabetes mellitus without complication ([place])   4. Mixed hyperlipidemia   5. CKD stage 5 secondary to hypertension (HCC)    PLAN:    In order of problems listed above:  Paroxysmal atrial fibrillation seems to maintain sinus rhythm, we will continue present management which include anticoagulation. Essential hypertension: Blood pressure well controlled continue present management. Diabetes followed by antimedicine team.  I do have her hemoglobin A1c which was 5.6 which is good. Dyslipidemia I did review her K PN which show me LDL of 41 HDL 35 this is from 07/10/2021.  We will continue present management which include Crestor 40. Peripheral vascular disease last time checked 2019 showed only minimal luminal irregularities in the carotids we will check test within the next year or so.   Medication Adjustments/Labs and Tests Ordered: Current medicines are reviewed at length with the patient today.  Concerns regarding medicines are outlined above.  No orders of the defined types were placed in this encounter.  Medication changes: No orders of the defined types  were placed in this encounter.   Signed, Park Liter, MD, [facility] 08/01/2021 2:53 PM    [place]

## 2021-08-03 DIAGNOSIS — D631 Anemia in chronic kidney disease: Secondary | ICD-10-CM | POA: Diagnosis not present

## 2021-08-03 DIAGNOSIS — N185 Chronic kidney disease, stage 5: Secondary | ICD-10-CM | POA: Diagnosis not present

## 2021-08-24 DIAGNOSIS — N185 Chronic kidney disease, stage 5: Secondary | ICD-10-CM | POA: Diagnosis not present

## 2021-08-24 DIAGNOSIS — D631 Anemia in chronic kidney disease: Secondary | ICD-10-CM | POA: Diagnosis not present

## 2021-08-28 DIAGNOSIS — M5136 Other intervertebral disc degeneration, lumbar region: Secondary | ICD-10-CM | POA: Diagnosis not present

## 2021-08-28 DIAGNOSIS — Z79891 Long term (current) use of opiate analgesic: Secondary | ICD-10-CM | POA: Diagnosis not present

## 2021-08-28 DIAGNOSIS — Z1389 Encounter for screening for other disorder: Secondary | ICD-10-CM | POA: Diagnosis not present

## 2021-08-28 DIAGNOSIS — Z9889 Other specified postprocedural states: Secondary | ICD-10-CM | POA: Diagnosis not present

## 2021-08-28 DIAGNOSIS — M419 Scoliosis, unspecified: Secondary | ICD-10-CM | POA: Diagnosis not present

## 2021-08-28 DIAGNOSIS — G894 Chronic pain syndrome: Secondary | ICD-10-CM | POA: Diagnosis not present

## 2021-08-28 DIAGNOSIS — M47816 Spondylosis without myelopathy or radiculopathy, lumbar region: Secondary | ICD-10-CM | POA: Diagnosis not present

## 2021-09-14 DIAGNOSIS — N185 Chronic kidney disease, stage 5: Secondary | ICD-10-CM | POA: Diagnosis not present

## 2021-09-14 DIAGNOSIS — D631 Anemia in chronic kidney disease: Secondary | ICD-10-CM | POA: Diagnosis not present

## 2021-09-25 DIAGNOSIS — G894 Chronic pain syndrome: Secondary | ICD-10-CM | POA: Diagnosis not present

## 2021-09-25 DIAGNOSIS — M419 Scoliosis, unspecified: Secondary | ICD-10-CM | POA: Diagnosis not present

## 2021-09-25 DIAGNOSIS — M47816 Spondylosis without myelopathy or radiculopathy, lumbar region: Secondary | ICD-10-CM | POA: Diagnosis not present

## 2021-09-25 DIAGNOSIS — Z79891 Long term (current) use of opiate analgesic: Secondary | ICD-10-CM | POA: Diagnosis not present

## 2021-09-25 DIAGNOSIS — M5136 Other intervertebral disc degeneration, lumbar region: Secondary | ICD-10-CM | POA: Diagnosis not present

## 2021-09-25 DIAGNOSIS — Z9889 Other specified postprocedural states: Secondary | ICD-10-CM | POA: Diagnosis not present

## 2021-09-25 DIAGNOSIS — Z1389 Encounter for screening for other disorder: Secondary | ICD-10-CM | POA: Diagnosis not present

## 2021-10-05 DIAGNOSIS — D631 Anemia in chronic kidney disease: Secondary | ICD-10-CM | POA: Diagnosis not present

## 2021-10-05 DIAGNOSIS — N185 Chronic kidney disease, stage 5: Secondary | ICD-10-CM | POA: Diagnosis not present

## 2021-10-10 DIAGNOSIS — N185 Chronic kidney disease, stage 5: Secondary | ICD-10-CM | POA: Diagnosis not present

## 2021-10-10 DIAGNOSIS — I120 Hypertensive chronic kidney disease with stage 5 chronic kidney disease or end stage renal disease: Secondary | ICD-10-CM | POA: Diagnosis not present

## 2021-10-10 DIAGNOSIS — D638 Anemia in other chronic diseases classified elsewhere: Secondary | ICD-10-CM | POA: Diagnosis not present

## 2021-10-10 DIAGNOSIS — F3341 Major depressive disorder, recurrent, in partial remission: Secondary | ICD-10-CM | POA: Diagnosis not present

## 2021-10-10 DIAGNOSIS — I4891 Unspecified atrial fibrillation: Secondary | ICD-10-CM | POA: Diagnosis not present

## 2021-10-10 DIAGNOSIS — Z23 Encounter for immunization: Secondary | ICD-10-CM | POA: Diagnosis not present

## 2021-10-10 DIAGNOSIS — E1142 Type 2 diabetes mellitus with diabetic polyneuropathy: Secondary | ICD-10-CM | POA: Diagnosis not present

## 2021-10-10 DIAGNOSIS — E785 Hyperlipidemia, unspecified: Secondary | ICD-10-CM | POA: Diagnosis not present

## 2021-10-10 DIAGNOSIS — E039 Hypothyroidism, unspecified: Secondary | ICD-10-CM | POA: Diagnosis not present

## 2021-10-19 DIAGNOSIS — N185 Chronic kidney disease, stage 5: Secondary | ICD-10-CM | POA: Diagnosis not present

## 2021-10-19 DIAGNOSIS — J449 Chronic obstructive pulmonary disease, unspecified: Secondary | ICD-10-CM | POA: Diagnosis not present

## 2021-10-19 DIAGNOSIS — E785 Hyperlipidemia, unspecified: Secondary | ICD-10-CM | POA: Diagnosis not present

## 2021-10-27 DIAGNOSIS — N185 Chronic kidney disease, stage 5: Secondary | ICD-10-CM | POA: Diagnosis not present

## 2021-10-27 DIAGNOSIS — D631 Anemia in chronic kidney disease: Secondary | ICD-10-CM | POA: Diagnosis not present

## 2021-11-02 DIAGNOSIS — G894 Chronic pain syndrome: Secondary | ICD-10-CM | POA: Diagnosis not present

## 2021-11-02 DIAGNOSIS — Z9889 Other specified postprocedural states: Secondary | ICD-10-CM | POA: Diagnosis not present

## 2021-11-02 DIAGNOSIS — M419 Scoliosis, unspecified: Secondary | ICD-10-CM | POA: Diagnosis not present

## 2021-11-02 DIAGNOSIS — M47816 Spondylosis without myelopathy or radiculopathy, lumbar region: Secondary | ICD-10-CM | POA: Diagnosis not present

## 2021-11-02 DIAGNOSIS — Z1389 Encounter for screening for other disorder: Secondary | ICD-10-CM | POA: Diagnosis not present

## 2021-11-02 DIAGNOSIS — M5136 Other intervertebral disc degeneration, lumbar region: Secondary | ICD-10-CM | POA: Diagnosis not present

## 2021-11-02 DIAGNOSIS — Z79891 Long term (current) use of opiate analgesic: Secondary | ICD-10-CM | POA: Diagnosis not present

## 2021-11-17 DIAGNOSIS — N185 Chronic kidney disease, stage 5: Secondary | ICD-10-CM | POA: Diagnosis not present

## 2021-11-17 DIAGNOSIS — D631 Anemia in chronic kidney disease: Secondary | ICD-10-CM | POA: Diagnosis not present

## 2021-11-28 DIAGNOSIS — N183 Chronic kidney disease, stage 3 unspecified: Secondary | ICD-10-CM | POA: Diagnosis not present

## 2021-11-28 DIAGNOSIS — I129 Hypertensive chronic kidney disease with stage 1 through stage 4 chronic kidney disease, or unspecified chronic kidney disease: Secondary | ICD-10-CM | POA: Diagnosis not present

## 2021-11-28 DIAGNOSIS — D631 Anemia in chronic kidney disease: Secondary | ICD-10-CM | POA: Diagnosis not present

## 2021-11-28 DIAGNOSIS — N2581 Secondary hyperparathyroidism of renal origin: Secondary | ICD-10-CM | POA: Diagnosis not present

## 2021-11-28 DIAGNOSIS — G8929 Other chronic pain: Secondary | ICD-10-CM | POA: Diagnosis not present

## 2021-11-30 DIAGNOSIS — Z9889 Other specified postprocedural states: Secondary | ICD-10-CM | POA: Diagnosis not present

## 2021-11-30 DIAGNOSIS — M5136 Other intervertebral disc degeneration, lumbar region: Secondary | ICD-10-CM | POA: Diagnosis not present

## 2021-11-30 DIAGNOSIS — M47816 Spondylosis without myelopathy or radiculopathy, lumbar region: Secondary | ICD-10-CM | POA: Diagnosis not present

## 2021-11-30 DIAGNOSIS — G894 Chronic pain syndrome: Secondary | ICD-10-CM | POA: Diagnosis not present

## 2021-11-30 DIAGNOSIS — M419 Scoliosis, unspecified: Secondary | ICD-10-CM | POA: Diagnosis not present

## 2021-11-30 DIAGNOSIS — Z79891 Long term (current) use of opiate analgesic: Secondary | ICD-10-CM | POA: Diagnosis not present

## 2021-11-30 DIAGNOSIS — Z1389 Encounter for screening for other disorder: Secondary | ICD-10-CM | POA: Diagnosis not present

## 2021-12-15 DIAGNOSIS — R531 Weakness: Secondary | ICD-10-CM | POA: Diagnosis not present

## 2021-12-15 DIAGNOSIS — I6523 Occlusion and stenosis of bilateral carotid arteries: Secondary | ICD-10-CM | POA: Diagnosis not present

## 2021-12-15 DIAGNOSIS — I700 Atherosclerosis of aorta: Secondary | ICD-10-CM | POA: Diagnosis not present

## 2021-12-15 DIAGNOSIS — M47813 Spondylosis without myelopathy or radiculopathy, cervicothoracic region: Secondary | ICD-10-CM | POA: Diagnosis not present

## 2021-12-15 DIAGNOSIS — M25521 Pain in right elbow: Secondary | ICD-10-CM | POA: Diagnosis not present

## 2021-12-15 DIAGNOSIS — S5001XA Contusion of right elbow, initial encounter: Secondary | ICD-10-CM | POA: Diagnosis not present

## 2021-12-15 DIAGNOSIS — S63124A Dislocation of unspecified interphalangeal joint of right thumb, initial encounter: Secondary | ICD-10-CM | POA: Diagnosis not present

## 2021-12-15 DIAGNOSIS — S60221A Contusion of right hand, initial encounter: Secondary | ICD-10-CM | POA: Diagnosis not present

## 2021-12-15 DIAGNOSIS — Z79899 Other long term (current) drug therapy: Secondary | ICD-10-CM | POA: Diagnosis not present

## 2021-12-15 DIAGNOSIS — S0990XA Unspecified injury of head, initial encounter: Secondary | ICD-10-CM | POA: Diagnosis not present

## 2021-12-15 DIAGNOSIS — I672 Cerebral atherosclerosis: Secondary | ICD-10-CM | POA: Diagnosis not present

## 2021-12-15 DIAGNOSIS — I517 Cardiomegaly: Secondary | ICD-10-CM | POA: Diagnosis not present

## 2021-12-15 DIAGNOSIS — S63124D Dislocation of unspecified interphalangeal joint of right thumb, subsequent encounter: Secondary | ICD-10-CM | POA: Diagnosis not present

## 2021-12-15 DIAGNOSIS — S63104A Unspecified dislocation of right thumb, initial encounter: Secondary | ICD-10-CM | POA: Diagnosis not present

## 2021-12-15 DIAGNOSIS — Z20822 Contact with and (suspected) exposure to covid-19: Secondary | ICD-10-CM | POA: Diagnosis not present

## 2021-12-15 DIAGNOSIS — S199XXA Unspecified injury of neck, initial encounter: Secondary | ICD-10-CM | POA: Diagnosis not present

## 2021-12-15 DIAGNOSIS — M47812 Spondylosis without myelopathy or radiculopathy, cervical region: Secondary | ICD-10-CM | POA: Diagnosis not present

## 2021-12-15 DIAGNOSIS — U071 COVID-19: Secondary | ICD-10-CM | POA: Diagnosis not present

## 2021-12-15 DIAGNOSIS — S63114A Dislocation of metacarpophalangeal joint of right thumb, initial encounter: Secondary | ICD-10-CM | POA: Diagnosis not present

## 2021-12-23 DIAGNOSIS — I2510 Atherosclerotic heart disease of native coronary artery without angina pectoris: Secondary | ICD-10-CM | POA: Diagnosis not present

## 2021-12-23 DIAGNOSIS — E039 Hypothyroidism, unspecified: Secondary | ICD-10-CM | POA: Diagnosis not present

## 2021-12-23 DIAGNOSIS — E785 Hyperlipidemia, unspecified: Secondary | ICD-10-CM | POA: Diagnosis not present

## 2021-12-23 DIAGNOSIS — E1122 Type 2 diabetes mellitus with diabetic chronic kidney disease: Secondary | ICD-10-CM | POA: Diagnosis not present

## 2021-12-23 DIAGNOSIS — D500 Iron deficiency anemia secondary to blood loss (chronic): Secondary | ICD-10-CM | POA: Diagnosis not present

## 2021-12-23 DIAGNOSIS — Z79899 Other long term (current) drug therapy: Secondary | ICD-10-CM | POA: Diagnosis not present

## 2021-12-23 DIAGNOSIS — R531 Weakness: Secondary | ICD-10-CM | POA: Diagnosis not present

## 2021-12-23 DIAGNOSIS — I517 Cardiomegaly: Secondary | ICD-10-CM | POA: Diagnosis not present

## 2021-12-23 DIAGNOSIS — R5383 Other fatigue: Secondary | ICD-10-CM | POA: Diagnosis not present

## 2021-12-23 DIAGNOSIS — I132 Hypertensive heart and chronic kidney disease with heart failure and with stage 5 chronic kidney disease, or end stage renal disease: Secondary | ICD-10-CM | POA: Diagnosis not present

## 2021-12-23 DIAGNOSIS — F419 Anxiety disorder, unspecified: Secondary | ICD-10-CM | POA: Diagnosis not present

## 2021-12-23 DIAGNOSIS — D509 Iron deficiency anemia, unspecified: Secondary | ICD-10-CM | POA: Diagnosis not present

## 2021-12-23 DIAGNOSIS — Z888 Allergy status to other drugs, medicaments and biological substances status: Secondary | ICD-10-CM | POA: Diagnosis not present

## 2021-12-23 DIAGNOSIS — I5030 Unspecified diastolic (congestive) heart failure: Secondary | ICD-10-CM | POA: Diagnosis not present

## 2021-12-23 DIAGNOSIS — N185 Chronic kidney disease, stage 5: Secondary | ICD-10-CM | POA: Diagnosis not present

## 2021-12-23 DIAGNOSIS — Z794 Long term (current) use of insulin: Secondary | ICD-10-CM | POA: Diagnosis not present

## 2021-12-23 DIAGNOSIS — I129 Hypertensive chronic kidney disease with stage 1 through stage 4 chronic kidney disease, or unspecified chronic kidney disease: Secondary | ICD-10-CM | POA: Diagnosis not present

## 2021-12-23 DIAGNOSIS — R1084 Generalized abdominal pain: Secondary | ICD-10-CM | POA: Diagnosis not present

## 2021-12-23 DIAGNOSIS — I252 Old myocardial infarction: Secondary | ICD-10-CM | POA: Diagnosis not present

## 2021-12-23 DIAGNOSIS — I482 Chronic atrial fibrillation, unspecified: Secondary | ICD-10-CM | POA: Diagnosis not present

## 2021-12-23 DIAGNOSIS — R630 Anorexia: Secondary | ICD-10-CM | POA: Diagnosis not present

## 2021-12-23 DIAGNOSIS — M1990 Unspecified osteoarthritis, unspecified site: Secondary | ICD-10-CM | POA: Diagnosis not present

## 2021-12-23 DIAGNOSIS — N179 Acute kidney failure, unspecified: Secondary | ICD-10-CM | POA: Diagnosis not present

## 2021-12-23 DIAGNOSIS — Z7902 Long term (current) use of antithrombotics/antiplatelets: Secondary | ICD-10-CM | POA: Diagnosis not present

## 2021-12-23 DIAGNOSIS — U071 COVID-19: Secondary | ICD-10-CM | POA: Diagnosis not present

## 2021-12-23 DIAGNOSIS — K219 Gastro-esophageal reflux disease without esophagitis: Secondary | ICD-10-CM | POA: Diagnosis not present

## 2021-12-23 DIAGNOSIS — E1165 Type 2 diabetes mellitus with hyperglycemia: Secondary | ICD-10-CM | POA: Diagnosis not present

## 2021-12-23 DIAGNOSIS — J449 Chronic obstructive pulmonary disease, unspecified: Secondary | ICD-10-CM | POA: Diagnosis not present

## 2021-12-23 DIAGNOSIS — I5032 Chronic diastolic (congestive) heart failure: Secondary | ICD-10-CM | POA: Diagnosis not present

## 2021-12-23 DIAGNOSIS — E860 Dehydration: Secondary | ICD-10-CM | POA: Diagnosis not present

## 2021-12-24 DIAGNOSIS — R531 Weakness: Secondary | ICD-10-CM | POA: Diagnosis not present

## 2021-12-24 DIAGNOSIS — U071 COVID-19: Secondary | ICD-10-CM | POA: Diagnosis not present

## 2021-12-24 DIAGNOSIS — N179 Acute kidney failure, unspecified: Secondary | ICD-10-CM | POA: Diagnosis not present

## 2021-12-24 DIAGNOSIS — I5030 Unspecified diastolic (congestive) heart failure: Secondary | ICD-10-CM | POA: Diagnosis not present

## 2021-12-24 DIAGNOSIS — E1165 Type 2 diabetes mellitus with hyperglycemia: Secondary | ICD-10-CM | POA: Diagnosis not present

## 2021-12-24 DIAGNOSIS — I482 Chronic atrial fibrillation, unspecified: Secondary | ICD-10-CM | POA: Diagnosis not present

## 2021-12-24 DIAGNOSIS — R630 Anorexia: Secondary | ICD-10-CM | POA: Diagnosis not present

## 2021-12-25 DIAGNOSIS — R630 Anorexia: Secondary | ICD-10-CM | POA: Diagnosis not present

## 2021-12-25 DIAGNOSIS — I482 Chronic atrial fibrillation, unspecified: Secondary | ICD-10-CM | POA: Diagnosis not present

## 2021-12-25 DIAGNOSIS — I5030 Unspecified diastolic (congestive) heart failure: Secondary | ICD-10-CM | POA: Diagnosis not present

## 2021-12-25 DIAGNOSIS — U071 COVID-19: Secondary | ICD-10-CM | POA: Diagnosis not present

## 2021-12-25 DIAGNOSIS — R531 Weakness: Secondary | ICD-10-CM | POA: Diagnosis not present

## 2021-12-25 DIAGNOSIS — N179 Acute kidney failure, unspecified: Secondary | ICD-10-CM | POA: Diagnosis not present

## 2021-12-25 DIAGNOSIS — E1165 Type 2 diabetes mellitus with hyperglycemia: Secondary | ICD-10-CM | POA: Diagnosis not present

## 2021-12-26 DIAGNOSIS — I482 Chronic atrial fibrillation, unspecified: Secondary | ICD-10-CM | POA: Diagnosis not present

## 2021-12-26 DIAGNOSIS — M1990 Unspecified osteoarthritis, unspecified site: Secondary | ICD-10-CM | POA: Diagnosis not present

## 2021-12-26 DIAGNOSIS — Z7901 Long term (current) use of anticoagulants: Secondary | ICD-10-CM | POA: Diagnosis not present

## 2021-12-26 DIAGNOSIS — I132 Hypertensive heart and chronic kidney disease with heart failure and with stage 5 chronic kidney disease, or end stage renal disease: Secondary | ICD-10-CM | POA: Diagnosis not present

## 2021-12-26 DIAGNOSIS — N179 Acute kidney failure, unspecified: Secondary | ICD-10-CM | POA: Diagnosis not present

## 2021-12-26 DIAGNOSIS — F419 Anxiety disorder, unspecified: Secondary | ICD-10-CM | POA: Diagnosis not present

## 2021-12-26 DIAGNOSIS — I2510 Atherosclerotic heart disease of native coronary artery without angina pectoris: Secondary | ICD-10-CM | POA: Diagnosis not present

## 2021-12-26 DIAGNOSIS — N185 Chronic kidney disease, stage 5: Secondary | ICD-10-CM | POA: Diagnosis not present

## 2021-12-26 DIAGNOSIS — Z87440 Personal history of urinary (tract) infections: Secondary | ICD-10-CM | POA: Diagnosis not present

## 2021-12-26 DIAGNOSIS — E7800 Pure hypercholesterolemia, unspecified: Secondary | ICD-10-CM | POA: Diagnosis not present

## 2021-12-26 DIAGNOSIS — J449 Chronic obstructive pulmonary disease, unspecified: Secondary | ICD-10-CM | POA: Diagnosis not present

## 2021-12-26 DIAGNOSIS — D631 Anemia in chronic kidney disease: Secondary | ICD-10-CM | POA: Diagnosis not present

## 2021-12-26 DIAGNOSIS — E1165 Type 2 diabetes mellitus with hyperglycemia: Secondary | ICD-10-CM | POA: Diagnosis not present

## 2021-12-26 DIAGNOSIS — E1122 Type 2 diabetes mellitus with diabetic chronic kidney disease: Secondary | ICD-10-CM | POA: Diagnosis not present

## 2021-12-26 DIAGNOSIS — I252 Old myocardial infarction: Secondary | ICD-10-CM | POA: Diagnosis not present

## 2021-12-26 DIAGNOSIS — I5030 Unspecified diastolic (congestive) heart failure: Secondary | ICD-10-CM | POA: Diagnosis not present

## 2021-12-26 DIAGNOSIS — E039 Hypothyroidism, unspecified: Secondary | ICD-10-CM | POA: Diagnosis not present

## 2021-12-26 DIAGNOSIS — U071 COVID-19: Secondary | ICD-10-CM | POA: Diagnosis not present

## 2022-01-01 DIAGNOSIS — Z1231 Encounter for screening mammogram for malignant neoplasm of breast: Secondary | ICD-10-CM | POA: Diagnosis not present

## 2022-01-01 DIAGNOSIS — N179 Acute kidney failure, unspecified: Secondary | ICD-10-CM | POA: Diagnosis not present

## 2022-01-01 DIAGNOSIS — E1142 Type 2 diabetes mellitus with diabetic polyneuropathy: Secondary | ICD-10-CM | POA: Diagnosis not present

## 2022-01-01 DIAGNOSIS — U071 COVID-19: Secondary | ICD-10-CM | POA: Diagnosis not present

## 2022-01-01 DIAGNOSIS — E785 Hyperlipidemia, unspecified: Secondary | ICD-10-CM | POA: Diagnosis not present

## 2022-01-01 DIAGNOSIS — N189 Chronic kidney disease, unspecified: Secondary | ICD-10-CM | POA: Diagnosis not present

## 2022-01-01 DIAGNOSIS — R630 Anorexia: Secondary | ICD-10-CM | POA: Diagnosis not present

## 2022-01-01 DIAGNOSIS — R531 Weakness: Secondary | ICD-10-CM | POA: Diagnosis not present

## 2022-01-03 ENCOUNTER — Telehealth: Payer: Self-pay | Admitting: Cardiology

## 2022-01-03 NOTE — Telephone Encounter (Signed)
Afib is fluctuating 108-122.Jeani Hawking from [facility] just wanted to make you aware.

## 2022-01-04 DIAGNOSIS — M47816 Spondylosis without myelopathy or radiculopathy, lumbar region: Secondary | ICD-10-CM | POA: Diagnosis not present

## 2022-01-04 DIAGNOSIS — Z79891 Long term (current) use of opiate analgesic: Secondary | ICD-10-CM | POA: Diagnosis not present

## 2022-01-04 DIAGNOSIS — Z9889 Other specified postprocedural states: Secondary | ICD-10-CM | POA: Diagnosis not present

## 2022-01-04 DIAGNOSIS — G894 Chronic pain syndrome: Secondary | ICD-10-CM | POA: Diagnosis not present

## 2022-01-04 DIAGNOSIS — M5136 Other intervertebral disc degeneration, lumbar region: Secondary | ICD-10-CM | POA: Diagnosis not present

## 2022-01-04 DIAGNOSIS — Z1389 Encounter for screening for other disorder: Secondary | ICD-10-CM | POA: Diagnosis not present

## 2022-01-04 DIAGNOSIS — M419 Scoliosis, unspecified: Secondary | ICD-10-CM | POA: Diagnosis not present

## 2022-01-05 NOTE — Telephone Encounter (Signed)
° °  STAT if HR is under 50 or over 120 (normal HR is 60-100 beats per minute)  What is your heart rate? Home Health not with the patient now.   Do you have a log of your heart rate readings (document readings)?  Range from 108-114 at rest.  Do you have any other symptoms? No, patient was asymptomatic  Vicente Males from [facility] called. She wanted to follow up on a call from this week. Callback number provided is for Corene Cornea, Environmental manager

## 2022-01-08 DIAGNOSIS — N185 Chronic kidney disease, stage 5: Secondary | ICD-10-CM | POA: Diagnosis not present

## 2022-01-08 DIAGNOSIS — D631 Anemia in chronic kidney disease: Secondary | ICD-10-CM | POA: Diagnosis not present

## 2022-01-08 NOTE — Telephone Encounter (Signed)
Patient called and informed of Dr. Wendy Poet response. Patient agreeable to his explanation and has no further questions at this time.

## 2022-01-10 DIAGNOSIS — D649 Anemia, unspecified: Secondary | ICD-10-CM | POA: Diagnosis not present

## 2022-01-15 DIAGNOSIS — N185 Chronic kidney disease, stage 5: Secondary | ICD-10-CM | POA: Diagnosis not present

## 2022-01-15 DIAGNOSIS — D631 Anemia in chronic kidney disease: Secondary | ICD-10-CM | POA: Diagnosis not present

## 2022-01-16 DIAGNOSIS — J449 Chronic obstructive pulmonary disease, unspecified: Secondary | ICD-10-CM | POA: Diagnosis not present

## 2022-01-16 DIAGNOSIS — I120 Hypertensive chronic kidney disease with stage 5 chronic kidney disease or end stage renal disease: Secondary | ICD-10-CM | POA: Diagnosis not present

## 2022-01-16 DIAGNOSIS — E785 Hyperlipidemia, unspecified: Secondary | ICD-10-CM | POA: Diagnosis not present

## 2022-01-18 DIAGNOSIS — I129 Hypertensive chronic kidney disease with stage 1 through stage 4 chronic kidney disease, or unspecified chronic kidney disease: Secondary | ICD-10-CM | POA: Diagnosis not present

## 2022-01-18 DIAGNOSIS — G8929 Other chronic pain: Secondary | ICD-10-CM | POA: Diagnosis not present

## 2022-01-18 DIAGNOSIS — N2581 Secondary hyperparathyroidism of renal origin: Secondary | ICD-10-CM | POA: Diagnosis not present

## 2022-01-18 DIAGNOSIS — N183 Chronic kidney disease, stage 3 unspecified: Secondary | ICD-10-CM | POA: Diagnosis not present

## 2022-01-18 DIAGNOSIS — D631 Anemia in chronic kidney disease: Secondary | ICD-10-CM | POA: Diagnosis not present

## 2022-01-22 DIAGNOSIS — N185 Chronic kidney disease, stage 5: Secondary | ICD-10-CM | POA: Diagnosis not present

## 2022-01-22 DIAGNOSIS — D631 Anemia in chronic kidney disease: Secondary | ICD-10-CM | POA: Diagnosis not present

## 2022-01-25 DIAGNOSIS — E1165 Type 2 diabetes mellitus with hyperglycemia: Secondary | ICD-10-CM | POA: Diagnosis not present

## 2022-01-25 DIAGNOSIS — I252 Old myocardial infarction: Secondary | ICD-10-CM | POA: Diagnosis not present

## 2022-01-25 DIAGNOSIS — N185 Chronic kidney disease, stage 5: Secondary | ICD-10-CM | POA: Diagnosis not present

## 2022-01-25 DIAGNOSIS — I482 Chronic atrial fibrillation, unspecified: Secondary | ICD-10-CM | POA: Diagnosis not present

## 2022-01-25 DIAGNOSIS — E7800 Pure hypercholesterolemia, unspecified: Secondary | ICD-10-CM | POA: Diagnosis not present

## 2022-01-25 DIAGNOSIS — J449 Chronic obstructive pulmonary disease, unspecified: Secondary | ICD-10-CM | POA: Diagnosis not present

## 2022-01-25 DIAGNOSIS — D631 Anemia in chronic kidney disease: Secondary | ICD-10-CM | POA: Diagnosis not present

## 2022-01-25 DIAGNOSIS — I5030 Unspecified diastolic (congestive) heart failure: Secondary | ICD-10-CM | POA: Diagnosis not present

## 2022-01-25 DIAGNOSIS — F419 Anxiety disorder, unspecified: Secondary | ICD-10-CM | POA: Diagnosis not present

## 2022-01-25 DIAGNOSIS — Z7901 Long term (current) use of anticoagulants: Secondary | ICD-10-CM | POA: Diagnosis not present

## 2022-01-25 DIAGNOSIS — N179 Acute kidney failure, unspecified: Secondary | ICD-10-CM | POA: Diagnosis not present

## 2022-01-25 DIAGNOSIS — U071 COVID-19: Secondary | ICD-10-CM | POA: Diagnosis not present

## 2022-01-25 DIAGNOSIS — Z87440 Personal history of urinary (tract) infections: Secondary | ICD-10-CM | POA: Diagnosis not present

## 2022-01-25 DIAGNOSIS — I132 Hypertensive heart and chronic kidney disease with heart failure and with stage 5 chronic kidney disease, or end stage renal disease: Secondary | ICD-10-CM | POA: Diagnosis not present

## 2022-01-25 DIAGNOSIS — E039 Hypothyroidism, unspecified: Secondary | ICD-10-CM | POA: Diagnosis not present

## 2022-01-25 DIAGNOSIS — I2510 Atherosclerotic heart disease of native coronary artery without angina pectoris: Secondary | ICD-10-CM | POA: Diagnosis not present

## 2022-01-25 DIAGNOSIS — E1122 Type 2 diabetes mellitus with diabetic chronic kidney disease: Secondary | ICD-10-CM | POA: Diagnosis not present

## 2022-01-25 DIAGNOSIS — M1990 Unspecified osteoarthritis, unspecified site: Secondary | ICD-10-CM | POA: Diagnosis not present

## 2022-01-29 DIAGNOSIS — N185 Chronic kidney disease, stage 5: Secondary | ICD-10-CM | POA: Diagnosis not present

## 2022-01-29 DIAGNOSIS — D631 Anemia in chronic kidney disease: Secondary | ICD-10-CM | POA: Diagnosis not present

## 2022-01-31 DIAGNOSIS — C179 Malignant neoplasm of small intestine, unspecified: Secondary | ICD-10-CM | POA: Diagnosis not present

## 2022-01-31 DIAGNOSIS — N179 Acute kidney failure, unspecified: Secondary | ICD-10-CM | POA: Diagnosis not present

## 2022-02-01 DIAGNOSIS — Z79891 Long term (current) use of opiate analgesic: Secondary | ICD-10-CM | POA: Diagnosis not present

## 2022-02-01 DIAGNOSIS — Z79899 Other long term (current) drug therapy: Secondary | ICD-10-CM | POA: Diagnosis not present

## 2022-02-01 DIAGNOSIS — Z1389 Encounter for screening for other disorder: Secondary | ICD-10-CM | POA: Diagnosis not present

## 2022-02-01 DIAGNOSIS — Z9889 Other specified postprocedural states: Secondary | ICD-10-CM | POA: Diagnosis not present

## 2022-02-01 DIAGNOSIS — M47816 Spondylosis without myelopathy or radiculopathy, lumbar region: Secondary | ICD-10-CM | POA: Diagnosis not present

## 2022-02-01 DIAGNOSIS — G894 Chronic pain syndrome: Secondary | ICD-10-CM | POA: Diagnosis not present

## 2022-02-01 DIAGNOSIS — M419 Scoliosis, unspecified: Secondary | ICD-10-CM | POA: Diagnosis not present

## 2022-02-01 DIAGNOSIS — M5136 Other intervertebral disc degeneration, lumbar region: Secondary | ICD-10-CM | POA: Diagnosis not present

## 2022-02-05 ENCOUNTER — Other Ambulatory Visit: Payer: Self-pay

## 2022-02-05 ENCOUNTER — Ambulatory Visit (INDEPENDENT_AMBULATORY_CARE_PROVIDER_SITE_OTHER): Payer: PPO

## 2022-02-05 ENCOUNTER — Ambulatory Visit: Payer: PPO | Admitting: Cardiology

## 2022-02-05 ENCOUNTER — Encounter: Payer: Self-pay | Admitting: Cardiology

## 2022-02-05 ENCOUNTER — Telehealth: Payer: Self-pay | Admitting: Cardiology

## 2022-02-05 VITALS — BP 120/66 | HR 86 | Ht 62.0 in | Wt 153.0 lb

## 2022-02-05 DIAGNOSIS — E782 Mixed hyperlipidemia: Secondary | ICD-10-CM | POA: Diagnosis not present

## 2022-02-05 DIAGNOSIS — N185 Chronic kidney disease, stage 5: Secondary | ICD-10-CM

## 2022-02-05 DIAGNOSIS — I480 Paroxysmal atrial fibrillation: Secondary | ICD-10-CM

## 2022-02-05 DIAGNOSIS — E1129 Type 2 diabetes mellitus with other diabetic kidney complication: Secondary | ICD-10-CM

## 2022-02-05 DIAGNOSIS — Z794 Long term (current) use of insulin: Secondary | ICD-10-CM

## 2022-02-05 DIAGNOSIS — I120 Hypertensive chronic kidney disease with stage 5 chronic kidney disease or end stage renal disease: Secondary | ICD-10-CM | POA: Diagnosis not present

## 2022-02-05 DIAGNOSIS — I10 Essential (primary) hypertension: Secondary | ICD-10-CM | POA: Diagnosis not present

## 2022-02-05 DIAGNOSIS — R809 Proteinuria, unspecified: Secondary | ICD-10-CM | POA: Diagnosis not present

## 2022-02-05 NOTE — Telephone Encounter (Signed)
STAT if HR is under 50 or over 120 (normal HR is 60-100 beats per minute)  What is your heart rate? 122 on Friday   Do you have a log of your heart rate readings (document readings)? Yes   Do you have any other symptoms? No

## 2022-02-05 NOTE — Progress Notes (Signed)
Cardiology Office Note:    Date:  02/05/2022   ID:  Robin Brewer, DOB [birth year], MRN [id number]  PCP:  Nicoletta Dress, MD  Cardiologist:  Jenne Campus, MD    Referring MD: Nicoletta Dress, MD   Chief Complaint  Patient presents with   Follow-up  have fast heart rate  History of Present Illness:    Robin Brewer is a 76 y.o. female  with past medical history significant for paroxysmal atrial fibrillation, she is anticoagulated, COPD, essential hypertension, diabetes, peripheral vascular disease in form of carotic arterial disease which is noncritical based on test from 2019, chronic kidney failure with creatinine now in the neighborhood of 6. She is coming today to monitor for regular follow-up but however she said that some employee noticed that she have fast heart beats and irregular.  And on physical exam look like she is back in atrial fibrillation.  Denies have any chest pain tightness squeezing pressure burning chest.  Still not being dialyzed however fistula has been forming in her right arm that failed.  Past Medical History:  Diagnosis Date   Acute encephalopathy 08/17/2013   Anemia    Anxiety    Anxiety state, unspecified 08/17/2013   ARF (acute renal failure) ([place]) 08/17/2013   Arthritis    Atrial fibrillation (HCC)    Bronchitis    hx of    CHF (congestive heart failure) ([place])    Chronic kidney disease    Chronic pain syndrome 08/17/2013   CKD stage 5 secondary to hypertension ([place]) 07/05/2020   COPD (chronic obstructive pulmonary disease) ([place])    Depression    Diabetes ([place]) 08/17/2013   Diabetes mellitus without complication (HCC)    type 2   Dysrhythmia    a-fib   Early cataracts, bilateral    Essential hypertension 08/17/2013   GERD (gastroesophageal reflux disease)    Headache    HTN (hypertension) 08/17/2013   Hyperlipidemia    Hypertension    Hypothyroidism    OA (osteoarthritis) of hip 08/12/2013   Paroxysmal atrial fibrillation ([place]) 04/19/2020    Right carotid bruit    S/P total hip arthroplasty 08/17/2013   Right. 08/12/13   Shortness of breath    with exertion    Unspecified hypothyroidism 08/17/2013   Wears glasses     Past Surgical History:  Procedure Laterality Date   ABDOMINAL HYSTERECTOMY     AV FISTULA PLACEMENT Right 10/20/2020   Procedure: RIGHT ARM ARTERIOVENOUS (AV) FISTULA CREATION;  Surgeon: [name], MD;  Location: [facility];  Service: Vascular;  Laterality: Right;   BACK SURGERY     CHOLECYSTECTOMY     COLONOSCOPY     NECK SURGERY     TOTAL HIP ARTHROPLASTY Right 08/12/2013   Procedure: RIGHT TOTAL HIP ARTHROPLASTY ANTERIOR APPROACH;  Surgeon: [name], MD;  Location: [facility];  Service: Orthopedics;  Laterality: Right;    Current Medications: Current Meds  Medication Sig   albuterol (VENTOLIN HFA) 108 (90 Base) MCG/ACT inhaler Inhale 2 puffs into the lungs every 6 (six) hours as needed for wheezing.   calcitRIOL (ROCALTROL) 0.25 MCG capsule Take 0.25 mcg by mouth 2 (two) times daily.   cetirizine (ZYRTEC) 10 MG tablet Take 10 mg by mouth at bedtime.   ELIQUIS 5 MG TABS tablet Take 5 mg by mouth 2 (two) times daily.    furosemide (LASIX) 80 MG tablet Take 80 mg by mouth 2 (two) times daily.   labetalol (NORMODYNE) 300  MG tablet Take 300 mg by mouth 2 (two) times daily.    LEVEMIR FLEXTOUCH 100 UNIT/ML FlexPen Inject 50 Units into the skin at bedtime.   levothyroxine (SYNTHROID, LEVOTHROID) 50 MCG tablet Take 50 mcg by mouth daily before breakfast.   mirtazapine (REMERON) 15 MG tablet Take 15 mg by mouth at bedtime.   omega-3 acid ethyl esters (LOVAZA) 1 g capsule Take 1 g by mouth 2 (two) times daily.    omeprazole (PRILOSEC) 20 MG capsule Take 20 mg by mouth daily.    oxyCODONE-acetaminophen (PERCOCET/ROXICET) 5-325 MG tablet Take 1 tablet by mouth every 6 (six) hours as needed for severe pain.   ranolazine (RANEXA) 500 MG 12 hr tablet Take 500 mg by mouth 2 (two) times daily.    rosuvastatin  (CRESTOR) 40 MG tablet Take 40 mg by mouth every evening.    sertraline (ZOLOFT) 100 MG tablet Take 100 mg by mouth every evening.     Allergies:   Aspirin and Niacin and related   Social History   Socioeconomic History   Marital status: Married    Spouse name: Not on file   Number of children: Not on file   Years of education: Not on file   Highest education level: Not on file  Occupational History   Not on file  Tobacco Use   Smoking status: Former    Packs/day: 0.25    Types: Cigarettes   Smokeless tobacco: Never  Vaping Use   Vaping Use: Never used  Substance and Sexual Activity   Alcohol use: No   Drug use: No   Sexual activity: Not Currently    Birth control/protection: Surgical    Comment: Hysterectomy  Other Topics Concern   Not on file  Social History Narrative   Not on file   Social Determinants of Health   Financial Resource Strain: Not on file  Food Insecurity: Not on file  Transportation Needs: Not on file  Physical Activity: Not on file  Stress: Not on file  Social Connections: Not on file     Family History: The patient's family history includes Heart disease in her mother; Transient ischemic attack in her mother. ROS:   Please see the history of present illness.    All 14 point review of systems negative except as described per history of present illness  EKGs/Labs/Other Studies Reviewed:      Recent Labs: No results found for requested labs within last 8760 hours.  Recent Lipid Panel No results found for: CHOL, TRIG, HDL, CHOLHDL, VLDL, LDLCALC, LDLDIRECT  Physical Exam:    VS:  BP 120/66 (BP Location: Left Arm, Patient Position: Sitting, Cuff Size: Normal)    Pulse 86    Ht 5\' 2"  (1.575 m)    Wt 153 lb (69.4 kg)    SpO2 99%    BMI 27.98 kg/m     Wt Readings from Last 3 Encounters:  02/05/22 153 lb (69.4 kg)  08/01/21 164 lb 6.4 oz (74.6 kg)  04/18/21 161 lb (73 kg)     GEN:  Well nourished, well developed in no acute  distress HEENT: Normal NECK: No JVD; No carotid bruits LYMPHATICS: No lymphadenopathy CARDIAC: Tachycardic and irregular, no murmurs, no rubs, no gallops RESPIRATORY:  Clear to auscultation without rales, wheezing or rhonchi  ABDOMEN: Soft, non-tender, non-distended MUSCULOSKELETAL:  No edema; No deformity  SKIN: Warm and dry LOWER EXTREMITIES: no swelling NEUROLOGIC:  Alert and oriented x 3 PSYCHIATRIC:  Normal affect   ASSESSMENT:  1. Paroxysmal atrial fibrillation ([place])   2. Primary hypertension   3. Essential hypertension   4. Type 2 diabetes mellitus with microalbuminuria, with long-term current use of insulin (HCC)   5. Mixed hyperlipidemia   6. CKD stage 5 secondary to hypertension (HCC)    PLAN:    In order of problems listed above:  Paroxysmal atrial fibrillation EKG will be done today I suspect she is in atrial fibrillation today.  She is being anticoagulated with Eliquis which I will continue.  I will also ask her to wear Zio patch to see if she could have paroxysmal atrial fibrillation. Essential hypertension blood pressure well controlled continue present management. History of carotid arterial disease time to repeat carotic ultrasounds which we will do. Mixed dyslipidemia, she is on Crestor 40 which is high intense statin which I will continue.  I did review K PN which show me her LDL of 36 HDL 38 from January 01, 2022. Chronic kidney failure she is not dialyzed yet.  I did review K PN which show me her creatinine 5.3.   Medication Adjustments/Labs and Tests Ordered: Current medicines are reviewed at length with the patient today.  Concerns regarding medicines are outlined above.  No orders of the defined types were placed in this encounter.  Medication changes: No orders of the defined types were placed in this encounter.   Signed, Park Liter, MD, [facility] 02/05/2022 3:40 PM    [place]

## 2022-02-05 NOTE — Telephone Encounter (Signed)
Spoke to the patient just now and she let me know that her heart rate was 122 bpm on Friday while she was working with PT.   This morning it was 100 bpm but this was before she took her medications. This was also when she was up walking around.   The patient has a UTI at this time but otherwise does not report any symptoms.  She has a 6 month follow up appointment with Dr. Agustin Cree this afternoon already scheduled and will keep that appointment.

## 2022-02-05 NOTE — Addendum Note (Signed)
Addended by: Edwyna Shell I on: 02/05/2022 04:16 PM   Modules accepted: Orders

## 2022-02-05 NOTE — Patient Instructions (Signed)
Medication Instructions:  Your physician recommends that you continue on your current medications as directed. Please refer to the Current Medication list given to you today.  *If you need a refill on your cardiac medications before your next appointment, please call your pharmacy*   Lab Work: None If you have labs (blood work) drawn today and your tests are completely normal, you will receive your results only by: [facility] (if you have MyChart) OR A paper copy in the mail If you have any lab test that is abnormal or we need to change your treatment, we will call you to review the results.   Testing/Procedures: A zio monitor was ordered today. It will remain on for 14 days. You will then return monitor and event diary in provided box. It takes 1-2 weeks for report to be downloaded and returned to [place]. We will call you with the results. If monitor falls off or has orange flashing light, please call Zio for further instructions.     Follow-Up: At [facility], you and your health needs are our priority.  As part of our continuing mission to provide you with exceptional heart care, we have created designated Provider Care Teams.  These Care Teams include your primary Cardiologist (physician) and Advanced Practice Providers (APPs -  Physician Assistants and Nurse Practitioners) who all work together to provide you with the care you need, when you need it.  We recommend signing up for the patient portal called "MyChart".  Sign up information is provided on this After Visit Summary.  MyChart is used to connect with patients for Virtual Visits (Telemedicine).  Patients are able to view lab/test results, encounter notes, upcoming appointments, etc.  Non-urgent messages can be sent to your provider as well.   To learn more about what you can do with MyChart, go to [email or web address].    Your next appointment:   1 month(s)  The format for your next appointment:   In  Person  Provider:   Jenne Campus, MD    Other:

## 2022-02-06 DIAGNOSIS — D631 Anemia in chronic kidney disease: Secondary | ICD-10-CM | POA: Diagnosis not present

## 2022-02-06 DIAGNOSIS — N185 Chronic kidney disease, stage 5: Secondary | ICD-10-CM | POA: Diagnosis not present

## 2022-02-26 DIAGNOSIS — I480 Paroxysmal atrial fibrillation: Secondary | ICD-10-CM | POA: Diagnosis not present

## 2022-02-26 DIAGNOSIS — I10 Essential (primary) hypertension: Secondary | ICD-10-CM | POA: Diagnosis not present

## 2022-02-27 DIAGNOSIS — D631 Anemia in chronic kidney disease: Secondary | ICD-10-CM | POA: Diagnosis not present

## 2022-02-27 DIAGNOSIS — N185 Chronic kidney disease, stage 5: Secondary | ICD-10-CM | POA: Diagnosis not present

## 2022-02-28 DIAGNOSIS — N2581 Secondary hyperparathyroidism of renal origin: Secondary | ICD-10-CM | POA: Diagnosis not present

## 2022-02-28 DIAGNOSIS — N189 Chronic kidney disease, unspecified: Secondary | ICD-10-CM | POA: Diagnosis not present

## 2022-02-28 DIAGNOSIS — I120 Hypertensive chronic kidney disease with stage 5 chronic kidney disease or end stage renal disease: Secondary | ICD-10-CM | POA: Diagnosis not present

## 2022-02-28 DIAGNOSIS — N185 Chronic kidney disease, stage 5: Secondary | ICD-10-CM | POA: Diagnosis not present

## 2022-02-28 DIAGNOSIS — D631 Anemia in chronic kidney disease: Secondary | ICD-10-CM | POA: Diagnosis not present

## 2022-03-05 DIAGNOSIS — G894 Chronic pain syndrome: Secondary | ICD-10-CM | POA: Diagnosis not present

## 2022-03-05 DIAGNOSIS — Z1389 Encounter for screening for other disorder: Secondary | ICD-10-CM | POA: Diagnosis not present

## 2022-03-05 DIAGNOSIS — M47816 Spondylosis without myelopathy or radiculopathy, lumbar region: Secondary | ICD-10-CM | POA: Diagnosis not present

## 2022-03-05 DIAGNOSIS — M419 Scoliosis, unspecified: Secondary | ICD-10-CM | POA: Diagnosis not present

## 2022-03-05 DIAGNOSIS — Z9889 Other specified postprocedural states: Secondary | ICD-10-CM | POA: Diagnosis not present

## 2022-03-05 DIAGNOSIS — M5136 Other intervertebral disc degeneration, lumbar region: Secondary | ICD-10-CM | POA: Diagnosis not present

## 2022-03-05 DIAGNOSIS — Z79891 Long term (current) use of opiate analgesic: Secondary | ICD-10-CM | POA: Diagnosis not present

## 2022-03-09 DIAGNOSIS — N189 Chronic kidney disease, unspecified: Secondary | ICD-10-CM | POA: Diagnosis not present

## 2022-03-09 DIAGNOSIS — D631 Anemia in chronic kidney disease: Secondary | ICD-10-CM | POA: Diagnosis not present

## 2022-03-14 ENCOUNTER — Ambulatory Visit: Payer: PPO | Admitting: Cardiology

## 2022-03-14 ENCOUNTER — Encounter: Payer: Self-pay | Admitting: Cardiology

## 2022-03-14 VITALS — BP 108/50 | HR 96 | Ht 62.0 in | Wt 155.2 lb

## 2022-03-14 DIAGNOSIS — E1129 Type 2 diabetes mellitus with other diabetic kidney complication: Secondary | ICD-10-CM | POA: Diagnosis not present

## 2022-03-14 DIAGNOSIS — I4819 Other persistent atrial fibrillation: Secondary | ICD-10-CM

## 2022-03-14 DIAGNOSIS — R809 Proteinuria, unspecified: Secondary | ICD-10-CM | POA: Diagnosis not present

## 2022-03-14 DIAGNOSIS — R0602 Shortness of breath: Secondary | ICD-10-CM

## 2022-03-14 DIAGNOSIS — Z794 Long term (current) use of insulin: Secondary | ICD-10-CM | POA: Diagnosis not present

## 2022-03-14 DIAGNOSIS — I10 Essential (primary) hypertension: Secondary | ICD-10-CM

## 2022-03-14 MED ORDER — AMIODARONE HCL 200 MG PO TABS: 200.0000 mg | ORAL_TABLET | Freq: Two times a day (BID) | ORAL | 3 refills | Status: DC

## 2022-03-14 NOTE — Patient Instructions (Addendum)
Medication Instructions:  ?Your physician has recommended you make the following change in your medication:  ? ?START: Amiodarone 200 mg twice daily  ? ?*If you need a refill on your cardiac medications before your next appointment, please call your pharmacy* ? ? ?Lab Work: ?None ?If you have labs (blood work) drawn today and your tests are completely normal, you will receive your results only by: ?MyChart Message (if you have MyChart) OR ?A paper copy in the mail ?If you have any lab test that is abnormal or we need to change your treatment, we will call you to review the results. ? ? ?Testing/Procedures: ?Your physician has requested that you have an echocardiogram. Echocardiography is a painless test that uses sound waves to create images of your heart. It provides your doctor with information about the size and shape of your heart and how well your heart?s chambers and valves are working. This procedure takes approximately one hour. There are no restrictions for this procedure. ? ? ? ?Follow-Up: ?At [facility], you and your health needs are our priority.  As part of our continuing mission to provide you with exceptional heart care, we have created designated Provider Care Teams.  These Care Teams include your primary Cardiologist (physician) and Advanced Practice Providers (APPs -  Physician Assistants and Nurse Practitioners) who all work together to provide you with the care you need, when you need it. ? ?We recommend signing up for the patient portal called "MyChart".  Sign up information is provided on this After Visit Summary.  MyChart is used to connect with patients for Virtual Visits (Telemedicine).  Patients are able to view lab/test results, encounter notes, upcoming appointments, etc.  Non-urgent messages can be sent to your provider as well.   ?To learn more about what you can do with MyChart, go to [email or web address].   ? ?Your next appointment:   ?6 week(s) ? ?The format for your next  appointment:   ?In Person ? ?Provider:   ?Jenne Campus, MD  ? ? ?Other Instructions ?None ? ?

## 2022-03-14 NOTE — Progress Notes (Signed)
?Cardiology Office Note:   ? ?Date:  03/14/2022  ? ?ID:  Robin Brewer, DOB 11-28-1946, MRN [id number] ? ?PCP:  Nicoletta Dress, MD  ?Cardiologist:  Jenne Campus, MD   ? ?Referring MD: Nicoletta Dress, MD  ? ?Chief Complaint  ?Patient presents with  ? Results  ?  zio  ? ? ?History of Present Illness:   ? ?Robin Brewer is a 76 y.o. female past medical history significant for paroxysmal atrial fibrillation now persistent, she is anticoagulated, COPD, essential hypertension, diabetes, vascular disease with carotid arterial disease also chronic kidney failure with a creatinine in the neighborhood of 6. ?She comes today to my office discuss results of her test she wore a Zio patch which showed persistent atrial fibrillation.  Rate average is 104.  Slightly high.  We discussed option for this situation she feels palpitations but does not bother her much.  I still think there will be option for cardioversion.  I will initiate amiodarone 200 twice daily bring her back my office in 6 weeks to see if she is back to normal rhythm in the meantime we will schedule her to have echocardiogram ? ?Past Medical History:  ?Diagnosis Date  ? Acute encephalopathy 08/17/2013  ? Anemia   ? Anxiety   ? Anxiety state, unspecified 08/17/2013  ? ARF (acute renal failure) ([place]) 08/17/2013  ? Arthritis   ? Atrial fibrillation ([place])   ? Bronchitis   ? hx of   ? CHF (congestive heart failure) ([place])   ? Chronic kidney disease   ? Chronic pain syndrome 08/17/2013  ? CKD stage 5 secondary to hypertension ([place]) 07/05/2020  ? COPD (chronic obstructive pulmonary disease) ([place])   ? Depression   ? Diabetes ([place]) 08/17/2013  ? Diabetes mellitus without complication ([place])   ? type 2  ? Dysrhythmia   ? a-fib  ? Early cataracts, bilateral   ? Essential hypertension 08/17/2013  ? GERD (gastroesophageal reflux disease)   ? Headache   ? HTN (hypertension) 08/17/2013  ? Hyperlipidemia   ? Hypertension   ? Hypothyroidism   ? OA (osteoarthritis) of hip 08/12/2013  ?  Paroxysmal atrial fibrillation ([place]) 04/19/2020  ? Right carotid bruit   ? S/P total hip arthroplasty 08/17/2013  ? Right. 08/12/13  ? Shortness of breath   ? with exertion   ? Unspecified hypothyroidism 08/17/2013  ? Wears glasses   ? ? ?Past Surgical History:  ?Procedure Laterality Date  ? ABDOMINAL HYSTERECTOMY    ? AV FISTULA PLACEMENT Right 10/20/2020  ? Procedure: RIGHT ARM ARTERIOVENOUS (AV) FISTULA CREATION;  Surgeon: [name], MD;  Location: [place];  Service: Vascular;  Laterality: Right;  ? BACK SURGERY    ? CHOLECYSTECTOMY    ? COLONOSCOPY    ? NECK SURGERY    ? TOTAL HIP ARTHROPLASTY Right 08/12/2013  ? Procedure: RIGHT TOTAL HIP ARTHROPLASTY ANTERIOR APPROACH;  Surgeon: [name], MD;  Location: [facility];  Service: Orthopedics;  Laterality: Right;  ? ? ?Current Medications: ?Current Meds  ?Medication Sig  ? albuterol (VENTOLIN HFA) 108 (90 Base) MCG/ACT inhaler Inhale 2 puffs into the lungs every 6 (six) hours as needed for wheezing.  ? amiodarone (PACERONE) 200 MG tablet Take 1 tablet (200 mg total) by mouth 2 (two) times daily.  ? calcitRIOL (ROCALTROL) 0.25 MCG capsule Take 0.25 mcg by mouth 2 (two) times daily.  ? cetirizine (ZYRTEC) 10 MG tablet Take 10 mg by mouth at bedtime.  ? ELIQUIS  5 MG TABS tablet Take 5 mg by mouth 2 (two) times daily.   ? furosemide (LASIX) 80 MG tablet Take 80 mg by mouth 2 (two) times daily.  ? labetalol (NORMODYNE) 300 MG tablet Take 300 mg by mouth 2 (two) times daily.   ? LEVEMIR FLEXTOUCH 100 UNIT/ML FlexPen Inject 50 Units into the skin at bedtime.  ? levothyroxine (SYNTHROID, LEVOTHROID) 50 MCG tablet Take 50 mcg by mouth daily before breakfast.  ? mirtazapine (REMERON) 15 MG tablet Take 15 mg by mouth at bedtime.  ? omega-3 acid ethyl esters (LOVAZA) 1 g capsule Take 1 g by mouth 2 (two) times daily.   ? omeprazole (PRILOSEC) 20 MG capsule Take 20 mg by mouth daily.   ? oxyCODONE-acetaminophen (PERCOCET/ROXICET) 5-325 MG tablet Take 1 tablet by mouth every 6  (six) hours as needed for severe pain.  ? ranolazine (RANEXA) 500 MG 12 hr tablet Take 500 mg by mouth 2 (two) times daily.   ? rosuvastatin (CRESTOR) 40 MG tablet Take 40 mg by mouth every evening.   ? sertraline (ZOLOFT) 100 MG tablet Take 100 mg by mouth every evening.  ?  ? ?Allergies:   Aspirin and Niacin and related  ? ?Social History  ? ?Socioeconomic History  ? Marital status: Married  ?  Spouse name: Not on file  ? Number of children: Not on file  ? Years of education: Not on file  ? Highest education level: Not on file  ?Occupational History  ? Not on file  ?Tobacco Use  ? Smoking status: Former  ?  Packs/day: 0.25  ?  Types: Cigarettes  ? Smokeless tobacco: Never  ?Vaping Use  ? Vaping Use: Never used  ?Substance and Sexual Activity  ? Alcohol use: No  ? Drug use: No  ? Sexual activity: Not Currently  ?  Birth control/protection: Surgical  ?  Comment: Hysterectomy  ?Other Topics Concern  ? Not on file  ?Social History Narrative  ? Not on file  ? ?Social Determinants of Health  ? ?Financial Resource Strain: Not on file  ?Food Insecurity: Not on file  ?Transportation Needs: Not on file  ?Physical Activity: Not on file  ?Stress: Not on file  ?Social Connections: Not on file  ?  ? ?Family History: ?The patient's family history includes Heart disease in her mother; Transient ischemic attack in her mother. ?ROS:   ?Please see the history of present illness.    ?All 14 point review of systems negative except as described per history of present illness ? ?EKGs/Labs/Other Studies Reviewed:   ? ? ? ?Recent Labs: ?No results found for requested labs within last 8760 hours.  ?Recent Lipid Panel ?No results found for: CHOL, TRIG, HDL, CHOLHDL, VLDL, LDLCALC, LDLDIRECT ? ?Physical Exam:   ? ?VS:  BP (!) 108/50 (BP Location: Right Arm, Patient Position: Sitting)   Pulse 96   Ht $R'5\' 2"'Bs$  (1.575 m)   Wt 155 lb 3.2 oz (70.4 kg)   SpO2 98%   BMI 28.39 kg/m?    ? ?Wt Readings from Last 3 Encounters:  ?03/14/22 155 lb 3.2  oz (70.4 kg)  ?02/05/22 153 lb (69.4 kg)  ?08/01/21 164 lb 6.4 oz (74.6 kg)  ?  ? ?GEN:  Well nourished, well developed in no acute distress ?HEENT: Normal ?NECK: No JVD; No carotid bruits ?LYMPHATICS: No lymphadenopathy ?CARDIAC: Irregularly irregular, no murmurs, no rubs, no gallops ?RESPIRATORY:  Clear to auscultation without rales, wheezing or rhonchi  ?ABDOMEN: Soft, non-tender,  non-distended ?MUSCULOSKELETAL:  No edema; No deformity  ?SKIN: Warm and dry ?LOWER EXTREMITIES: no swelling ?NEUROLOGIC:  Alert and oriented x 3 ?PSYCHIATRIC:  Normal affect  ? ?ASSESSMENT:   ? ?1. Persistent atrial fibrillation ([place])   ?2. Primary hypertension   ?3. Type 2 diabetes mellitus with microalbuminuria, with long-term current use of insulin ([place])   ?4. Shortness of breath   ? ?PLAN:   ? ?In order of problems listed above: ? ?Persistent atrial fibrillation: Plan as outlined above amiodarone will be initiated, TSH was normal, will continue anticoagulation we will bring her back in 6 weeks to see if we need to cardiovert her.  In the meantime echocardiogram will be done to assess atrial size ?Essential hypertension blood pressure well controlled continue present management. ?Type 2 diabetes that being followed by internal medicine team.  I do see hemoglobin A1c which is 6.0 in January ?Shortness of breath denies having any seems to be doing well otherwise. ?Chronic kidney failure.  Noted.  Last creatinine I see is from January 16 which is 5.3. ? ? ?Medication Adjustments/Labs and Tests Ordered: ?Current medicines are reviewed at length with the patient today.  Concerns regarding medicines are outlined above.  ?Orders Placed This Encounter  ?Procedures  ? ECHOCARDIOGRAM COMPLETE  ? ?Medication changes:  ?Meds ordered this encounter  ?Medications  ? amiodarone (PACERONE) 200 MG tablet  ?  Sig: Take 1 tablet (200 mg total) by mouth 2 (two) times daily.  ?  Dispense:  180 tablet  ?  Refill:  3  ? ? ?Signed, ?Park Liter,  MD, [facility] ?03/14/2022 1:13 PM    ?[place] ?

## 2022-03-15 ENCOUNTER — Telehealth: Payer: Self-pay

## 2022-03-15 NOTE — Telephone Encounter (Signed)
Patient notified of results and recommendation and agreed with plan. ?

## 2022-03-15 NOTE — Telephone Encounter (Signed)
-----   Message from Park Liter, MD sent at 03/05/2022 10:01 AM EDT ----- ?Monitor showed persistent atrial fibrillation altered recording.  We will talk about what to do with the situation ? ?During appointment ?

## 2022-03-16 DIAGNOSIS — I120 Hypertensive chronic kidney disease with stage 5 chronic kidney disease or end stage renal disease: Secondary | ICD-10-CM | POA: Diagnosis not present

## 2022-03-16 DIAGNOSIS — N185 Chronic kidney disease, stage 5: Secondary | ICD-10-CM | POA: Diagnosis not present

## 2022-03-16 DIAGNOSIS — J449 Chronic obstructive pulmonary disease, unspecified: Secondary | ICD-10-CM | POA: Diagnosis not present

## 2022-03-19 ENCOUNTER — Ambulatory Visit (INDEPENDENT_AMBULATORY_CARE_PROVIDER_SITE_OTHER): Payer: PPO

## 2022-03-19 DIAGNOSIS — R809 Proteinuria, unspecified: Secondary | ICD-10-CM | POA: Diagnosis not present

## 2022-03-19 DIAGNOSIS — I10 Essential (primary) hypertension: Secondary | ICD-10-CM | POA: Diagnosis not present

## 2022-03-19 DIAGNOSIS — I4819 Other persistent atrial fibrillation: Secondary | ICD-10-CM | POA: Diagnosis not present

## 2022-03-19 DIAGNOSIS — Z794 Long term (current) use of insulin: Secondary | ICD-10-CM

## 2022-03-19 DIAGNOSIS — E1129 Type 2 diabetes mellitus with other diabetic kidney complication: Secondary | ICD-10-CM | POA: Diagnosis not present

## 2022-03-19 LAB — ECHOCARDIOGRAM COMPLETE
Area-P 1/2: 4.15 cm2
MV M vel: 5.26 m/s
MV Peak grad: 110.7 mmHg
Radius: 0.5 cm
S' Lateral: 3.3 cm

## 2022-03-20 DIAGNOSIS — D631 Anemia in chronic kidney disease: Secondary | ICD-10-CM | POA: Diagnosis not present

## 2022-03-20 DIAGNOSIS — N185 Chronic kidney disease, stage 5: Secondary | ICD-10-CM | POA: Diagnosis not present

## 2022-04-02 DIAGNOSIS — E039 Hypothyroidism, unspecified: Secondary | ICD-10-CM | POA: Diagnosis not present

## 2022-04-02 DIAGNOSIS — N185 Chronic kidney disease, stage 5: Secondary | ICD-10-CM | POA: Diagnosis not present

## 2022-04-02 DIAGNOSIS — D638 Anemia in other chronic diseases classified elsewhere: Secondary | ICD-10-CM | POA: Diagnosis not present

## 2022-04-02 DIAGNOSIS — E1142 Type 2 diabetes mellitus with diabetic polyneuropathy: Secondary | ICD-10-CM | POA: Diagnosis not present

## 2022-04-02 DIAGNOSIS — J309 Allergic rhinitis, unspecified: Secondary | ICD-10-CM | POA: Diagnosis not present

## 2022-04-02 DIAGNOSIS — I4891 Unspecified atrial fibrillation: Secondary | ICD-10-CM | POA: Diagnosis not present

## 2022-04-02 DIAGNOSIS — F3341 Major depressive disorder, recurrent, in partial remission: Secondary | ICD-10-CM | POA: Diagnosis not present

## 2022-04-02 DIAGNOSIS — E785 Hyperlipidemia, unspecified: Secondary | ICD-10-CM | POA: Diagnosis not present

## 2022-04-02 DIAGNOSIS — I120 Hypertensive chronic kidney disease with stage 5 chronic kidney disease or end stage renal disease: Secondary | ICD-10-CM | POA: Diagnosis not present

## 2022-04-03 ENCOUNTER — Encounter: Payer: Self-pay | Admitting: Cardiology

## 2022-04-03 DIAGNOSIS — Z9889 Other specified postprocedural states: Secondary | ICD-10-CM | POA: Diagnosis not present

## 2022-04-03 DIAGNOSIS — G894 Chronic pain syndrome: Secondary | ICD-10-CM | POA: Diagnosis not present

## 2022-04-03 DIAGNOSIS — M419 Scoliosis, unspecified: Secondary | ICD-10-CM | POA: Diagnosis not present

## 2022-04-03 DIAGNOSIS — M47816 Spondylosis without myelopathy or radiculopathy, lumbar region: Secondary | ICD-10-CM | POA: Diagnosis not present

## 2022-04-03 DIAGNOSIS — Z79891 Long term (current) use of opiate analgesic: Secondary | ICD-10-CM | POA: Diagnosis not present

## 2022-04-03 DIAGNOSIS — M5136 Other intervertebral disc degeneration, lumbar region: Secondary | ICD-10-CM | POA: Diagnosis not present

## 2022-04-03 DIAGNOSIS — Z1389 Encounter for screening for other disorder: Secondary | ICD-10-CM | POA: Diagnosis not present

## 2022-04-09 DIAGNOSIS — I120 Hypertensive chronic kidney disease with stage 5 chronic kidney disease or end stage renal disease: Secondary | ICD-10-CM | POA: Diagnosis not present

## 2022-04-09 DIAGNOSIS — N2581 Secondary hyperparathyroidism of renal origin: Secondary | ICD-10-CM | POA: Diagnosis not present

## 2022-04-09 DIAGNOSIS — D631 Anemia in chronic kidney disease: Secondary | ICD-10-CM | POA: Diagnosis not present

## 2022-04-09 DIAGNOSIS — N185 Chronic kidney disease, stage 5: Secondary | ICD-10-CM | POA: Diagnosis not present

## 2022-04-10 DIAGNOSIS — D631 Anemia in chronic kidney disease: Secondary | ICD-10-CM | POA: Diagnosis not present

## 2022-04-10 DIAGNOSIS — N185 Chronic kidney disease, stage 5: Secondary | ICD-10-CM | POA: Diagnosis not present

## 2022-04-15 DIAGNOSIS — N185 Chronic kidney disease, stage 5: Secondary | ICD-10-CM | POA: Diagnosis not present

## 2022-04-15 DIAGNOSIS — I120 Hypertensive chronic kidney disease with stage 5 chronic kidney disease or end stage renal disease: Secondary | ICD-10-CM | POA: Diagnosis not present

## 2022-04-15 DIAGNOSIS — J449 Chronic obstructive pulmonary disease, unspecified: Secondary | ICD-10-CM | POA: Diagnosis not present

## 2022-04-26 ENCOUNTER — Ambulatory Visit: Payer: PPO | Admitting: Cardiology

## 2022-04-26 ENCOUNTER — Encounter: Payer: Self-pay | Admitting: Cardiology

## 2022-04-26 VITALS — BP 122/78 | HR 107 | Ht 62.0 in | Wt 151.8 lb

## 2022-04-26 DIAGNOSIS — R809 Proteinuria, unspecified: Secondary | ICD-10-CM | POA: Diagnosis not present

## 2022-04-26 DIAGNOSIS — Z794 Long term (current) use of insulin: Secondary | ICD-10-CM

## 2022-04-26 DIAGNOSIS — I4821 Permanent atrial fibrillation: Secondary | ICD-10-CM

## 2022-04-26 DIAGNOSIS — I480 Paroxysmal atrial fibrillation: Secondary | ICD-10-CM

## 2022-04-26 DIAGNOSIS — E782 Mixed hyperlipidemia: Secondary | ICD-10-CM | POA: Diagnosis not present

## 2022-04-26 DIAGNOSIS — N184 Chronic kidney disease, stage 4 (severe): Secondary | ICD-10-CM

## 2022-04-26 DIAGNOSIS — E1129 Type 2 diabetes mellitus with other diabetic kidney complication: Secondary | ICD-10-CM | POA: Diagnosis not present

## 2022-04-26 DIAGNOSIS — I10 Essential (primary) hypertension: Secondary | ICD-10-CM

## 2022-04-26 NOTE — Patient Instructions (Signed)
Medication Instructions:  ?STOP: Amiodarone ? ? ?Lab Work: ?None Ordered ?If you have labs (blood work) drawn today and your tests are completely normal, you will receive your results only by: ?MyChart Message (if you have MyChart) OR ?A paper copy in the mail ?If you have any lab test that is abnormal or we need to change your treatment, we will call you to review the results. ? ? ?Testing/Procedures: ?None Ordered ? ? ?Follow-Up: ?At [facility], you and your health needs are our priority.  As part of our continuing mission to provide you with exceptional heart care, we have created designated Provider Care Teams.  These Care Teams include your primary Cardiologist (physician) and Advanced Practice Providers (APPs -  Physician Assistants and Nurse Practitioners) who all work together to provide you with the care you need, when you need it. ? ?We recommend signing up for the patient portal called "MyChart".  Sign up information is provided on this After Visit Summary.  MyChart is used to connect with patients for Virtual Visits (Telemedicine).  Patients are able to view lab/test results, encounter notes, upcoming appointments, etc.  Non-urgent messages can be sent to your provider as well.   ?To learn more about what you can do with MyChart, go to [email or web address].   ? ?Your next appointment:   ?1 month(s) ? ?The format for your next appointment:   ?In Person ? ?Provider:   ?Jenne Campus, MD  ? ? ?Other Instructions ?NA  ?

## 2022-04-26 NOTE — Progress Notes (Signed)
?Cardiology Office Note:   ? ?Date:  04/26/2022  ? ?ID:  Robin Brewer, DOB 1946/08/18, MRN [id number] ? ?PCP:  Nicoletta Dress, MD  ?Cardiologist:  Jenne Campus, MD   ? ?Referring MD: Nicoletta Dress, MD  ? ?Chief Complaint  ?Patient presents with  ? Medication Management  ? ? ?History of Present Illness:   ? ?Robin Brewer is a 76 y.o. female with past medical history significant for now which appears to be permanent atrial fibrillation she is anticoagulated, COPD, essential hypertension, diabetes, carotic arterial disease, chronic kidney failure with creatinine Nebido of 6.5 now.  Last time we met we will talk about potentially using a rhythm control strategy for her atrial fibrillation I increased dose of her amiodarone however she did not feel well she became dizzy and simply intolerant to this medication.  It has been discontinued.  She comes today to talk about it.  We talked in length about the advantages disadvantages of the 2 strategies rate control strategy and rhythm control strategy.  Since she does have quite enlarged atrium I think chance of keeping her successful long-term sinus rhythm will be rather small.  Therefore we elected to proceed with just rate control strategy.  Amiodarone has been discontinued I bring him back to my office in about a month to see what her rate control is like.  Obviously we will continue anticoagulation.  Otherwise she is doing well she denies have any chest pain tightness squeezing pressure burning chest. ? ?Past Medical History:  ?Diagnosis Date  ? Acute encephalopathy 08/17/2013  ? Anemia   ? Anxiety   ? Anxiety state, unspecified 08/17/2013  ? ARF (acute renal failure) ([place]) 08/17/2013  ? Arthritis   ? Atrial fibrillation ([place])   ? Bronchitis   ? hx of   ? CHF (congestive heart failure) ([place])   ? Chronic kidney disease   ? Chronic pain syndrome 08/17/2013  ? CKD stage 5 secondary to hypertension ([place]) 07/05/2020  ? COPD (chronic obstructive pulmonary disease) ([place])    ? Depression   ? Diabetes ([place]) 08/17/2013  ? Diabetes mellitus without complication ([place])   ? type 2  ? Dysrhythmia   ? a-fib  ? Early cataracts, bilateral   ? Essential hypertension 08/17/2013  ? GERD (gastroesophageal reflux disease)   ? Headache   ? HTN (hypertension) 08/17/2013  ? Hyperlipidemia   ? Hypertension   ? Hypothyroidism   ? OA (osteoarthritis) of hip 08/12/2013  ? Paroxysmal atrial fibrillation ([place]) 04/19/2020  ? Right carotid bruit   ? S/P total hip arthroplasty 08/17/2013  ? Right. 08/12/13  ? Shortness of breath   ? with exertion   ? Unspecified hypothyroidism 08/17/2013  ? Wears glasses   ? ? ?Past Surgical History:  ?Procedure Laterality Date  ? ABDOMINAL HYSTERECTOMY    ? AV FISTULA PLACEMENT Right 10/20/2020  ? Procedure: RIGHT ARM ARTERIOVENOUS (AV) FISTULA CREATION;  Surgeon: [name], MD;  Location: [place];  Service: Vascular;  Laterality: Right;  ? BACK SURGERY    ? CHOLECYSTECTOMY    ? COLONOSCOPY    ? NECK SURGERY    ? TOTAL HIP ARTHROPLASTY Right 08/12/2013  ? Procedure: RIGHT TOTAL HIP ARTHROPLASTY ANTERIOR APPROACH;  Surgeon: [name], MD;  Location: [facility];  Service: Orthopedics;  Laterality: Right;  ? ? ?Current Medications: ?Current Meds  ?Medication Sig  ? albuterol (VENTOLIN HFA) 108 (90 Base) MCG/ACT inhaler Inhale 2 puffs into the lungs every 6 (  six) hours as needed for wheezing.  ? amiodarone (PACERONE) 200 MG tablet Take 1 tablet (200 mg total) by mouth 2 (two) times daily.  ? calcitRIOL (ROCALTROL) 0.25 MCG capsule Take 0.25 mcg by mouth 2 (two) times daily.  ? cetirizine (ZYRTEC) 10 MG tablet Take 10 mg by mouth at bedtime.  ? ELIQUIS 5 MG TABS tablet Take 5 mg by mouth 2 (two) times daily.   ? furosemide (LASIX) 80 MG tablet Take 80 mg by mouth 2 (two) times daily.  ? labetalol (NORMODYNE) 300 MG tablet Take 300 mg by mouth 2 (two) times daily.   ? LEVEMIR FLEXTOUCH 100 UNIT/ML FlexPen Inject 50 Units into the skin at bedtime.  ? levothyroxine (SYNTHROID, LEVOTHROID)  50 MCG tablet Take 50 mcg by mouth daily before breakfast.  ? mirtazapine (REMERON) 15 MG tablet Take 15 mg by mouth at bedtime.  ? omega-3 acid ethyl esters (LOVAZA) 1 g capsule Take 1 g by mouth 2 (two) times daily.   ? omeprazole (PRILOSEC) 20 MG capsule Take 20 mg by mouth daily.   ? oxyCODONE-acetaminophen (PERCOCET/ROXICET) 5-325 MG tablet Take 1 tablet by mouth every 6 (six) hours as needed for severe pain.  ? ranolazine (RANEXA) 500 MG 12 hr tablet Take 500 mg by mouth 2 (two) times daily.   ? rosuvastatin (CRESTOR) 40 MG tablet Take 40 mg by mouth every evening.   ? sertraline (ZOLOFT) 100 MG tablet Take 100 mg by mouth every evening.  ?  ? ?Allergies:   Aspirin and Niacin and related  ? ?Social History  ? ?Socioeconomic History  ? Marital status: Married  ?  Spouse name: Not on file  ? Number of children: Not on file  ? Years of education: Not on file  ? Highest education level: Not on file  ?Occupational History  ? Not on file  ?Tobacco Use  ? Smoking status: Former  ?  Packs/day: 0.25  ?  Types: Cigarettes  ? Smokeless tobacco: Never  ?Vaping Use  ? Vaping Use: Never used  ?Substance and Sexual Activity  ? Alcohol use: No  ? Drug use: No  ? Sexual activity: Not Currently  ?  Birth control/protection: Surgical  ?  Comment: Hysterectomy  ?Other Topics Concern  ? Not on file  ?Social History Narrative  ? Not on file  ? ?Social Determinants of Health  ? ?Financial Resource Strain: Not on file  ?Food Insecurity: Not on file  ?Transportation Needs: Not on file  ?Physical Activity: Not on file  ?Stress: Not on file  ?Social Connections: Not on file  ?  ? ?Family History: ?The patient's family history includes Heart disease in her mother; Transient ischemic attack in her mother. ?ROS:   ?Please see the history of present illness.    ?All 14 point review of systems negative except as described per history of present illness ? ?EKGs/Labs/Other Studies Reviewed:   ? ? ? ?Recent Labs: ?No results found for requested  labs within last 8760 hours.  ?Recent Lipid Panel ?No results found for: CHOL, TRIG, HDL, CHOLHDL, VLDL, LDLCALC, LDLDIRECT ? ?Physical Exam:   ? ?VS:  BP 122/78 (BP Location: Left Arm, Patient Position: Sitting)   Pulse (!) 107   Ht 5' 2" (1.575 m)   Wt 151 lb 12.8 oz (68.9 kg)   SpO2 99%   BMI 27.76 kg/m?    ? ?Wt Readings from Last 3 Encounters:  ?04/26/22 151 lb 12.8 oz (68.9 kg)  ?03/14/22 155 lb  3.2 oz (70.4 kg)  ?02/05/22 153 lb (69.4 kg)  ?  ? ?GEN:  Well nourished, well developed in no acute distress ?HEENT: Normal ?NECK: No JVD; No carotid bruits ?LYMPHATICS: No lymphadenopathy ?CARDIAC: Irregularly irregular, no murmurs, no rubs, no gallops ?RESPIRATORY:  Clear to auscultation without rales, wheezing or rhonchi  ?ABDOMEN: Soft, non-tender, non-distended ?MUSCULOSKELETAL:  No edema; No deformity  ?SKIN: Warm and dry ?LOWER EXTREMITIES: no swelling ?NEUROLOGIC:  Alert and oriented x 3 ?PSYCHIATRIC:  Normal affect  ? ?ASSESSMENT:   ? ?1. Paroxysmal atrial fibrillation (HCC)   ?2. Permanent atrial fibrillation ([place])   ?3. Primary hypertension   ?4. Type 2 diabetes mellitus with microalbuminuria, with long-term current use of insulin ([place])   ?5. Mixed hyperlipidemia   ?6. Stage 4 chronic kidney disease ([place])   ? ?PLAN:   ? ?In order of problems listed above: ? ?Paroxysmal atrial fibrillation now permanent atrial fibrillation please look at my discussion above.  They do not want to try any additional antiarrhythmic therapy we decided to go with rate control strategy which seems to be working quite well right now overall she feels well. ?Essential hypertension blood pressure seems to well controlled continue present management. ?Chronic kidney failure that being followed by internal medicine team and nephrology.  Creatinine level of 6.5.  Some conversation on dialysis however I am not sure if patient is keen on that. ?Mixed dyslipidemia I did review her K PN which show me data from April of this year with  HDL of 44 LDL 48.  We will continue present management ? ? ?Medication Adjustments/Labs and Tests Ordered: ?Current medicines are reviewed at length with the patient today.  Concerns regarding medicines a

## 2022-04-26 NOTE — Addendum Note (Signed)
Addended by: Jacobo Forest D on: 04/26/2022 03:35 PM ? ? Modules accepted: Orders ? ?

## 2022-05-01 DIAGNOSIS — D631 Anemia in chronic kidney disease: Secondary | ICD-10-CM | POA: Diagnosis not present

## 2022-05-01 DIAGNOSIS — N185 Chronic kidney disease, stage 5: Secondary | ICD-10-CM | POA: Diagnosis not present

## 2022-05-11 DIAGNOSIS — L03032 Cellulitis of left toe: Secondary | ICD-10-CM | POA: Diagnosis not present

## 2022-05-12 DIAGNOSIS — L03032 Cellulitis of left toe: Secondary | ICD-10-CM | POA: Diagnosis not present

## 2022-05-12 DIAGNOSIS — M7989 Other specified soft tissue disorders: Secondary | ICD-10-CM | POA: Diagnosis not present

## 2022-05-12 DIAGNOSIS — E119 Type 2 diabetes mellitus without complications: Secondary | ICD-10-CM | POA: Diagnosis not present

## 2022-05-12 DIAGNOSIS — M779 Enthesopathy, unspecified: Secondary | ICD-10-CM | POA: Diagnosis not present

## 2022-05-17 DIAGNOSIS — Z794 Long term (current) use of insulin: Secondary | ICD-10-CM | POA: Diagnosis not present

## 2022-05-17 DIAGNOSIS — E1151 Type 2 diabetes mellitus with diabetic peripheral angiopathy without gangrene: Secondary | ICD-10-CM | POA: Diagnosis not present

## 2022-05-17 DIAGNOSIS — L03032 Cellulitis of left toe: Secondary | ICD-10-CM | POA: Diagnosis not present

## 2022-05-22 DIAGNOSIS — N185 Chronic kidney disease, stage 5: Secondary | ICD-10-CM | POA: Diagnosis not present

## 2022-05-22 DIAGNOSIS — D631 Anemia in chronic kidney disease: Secondary | ICD-10-CM | POA: Diagnosis not present

## 2022-05-25 DIAGNOSIS — M5136 Other intervertebral disc degeneration, lumbar region: Secondary | ICD-10-CM | POA: Diagnosis not present

## 2022-05-25 DIAGNOSIS — Z9889 Other specified postprocedural states: Secondary | ICD-10-CM | POA: Diagnosis not present

## 2022-05-25 DIAGNOSIS — M47816 Spondylosis without myelopathy or radiculopathy, lumbar region: Secondary | ICD-10-CM | POA: Diagnosis not present

## 2022-05-25 DIAGNOSIS — Z1389 Encounter for screening for other disorder: Secondary | ICD-10-CM | POA: Diagnosis not present

## 2022-05-25 DIAGNOSIS — G894 Chronic pain syndrome: Secondary | ICD-10-CM | POA: Diagnosis not present

## 2022-05-25 DIAGNOSIS — Z79891 Long term (current) use of opiate analgesic: Secondary | ICD-10-CM | POA: Diagnosis not present

## 2022-05-25 DIAGNOSIS — M419 Scoliosis, unspecified: Secondary | ICD-10-CM | POA: Diagnosis not present

## 2022-05-28 ENCOUNTER — Other Ambulatory Visit: Payer: Self-pay

## 2022-05-28 DIAGNOSIS — I739 Peripheral vascular disease, unspecified: Secondary | ICD-10-CM

## 2022-05-28 NOTE — Progress Notes (Signed)
[facility] [place]  ASSESSMENT / PLAN: Robin Brewer is a 76 y.o. female with atherosclerosis of native arteries of left lower extremity causing ulceration.  Patient counseled patients with chronic limb threatening ischemia have an annual risk of cardiovascular mortality of 25% and a high risk of amputation.   WIfI score: 1 / 3 / 1. Clinical stage III. Moderate amputation risk. High benefit from revascularization.   Recommend the following which can slow the progression of atherosclerosis and reduce the risk of major adverse cardiac / limb events:  Complete cessation from all tobacco products. Blood glucose control with goal A1c < 7%. Blood pressure control with goal blood pressure < 140/90 mmHg. Lipid reduction therapy with goal LDL-C <100 mg/dL (<70 if symptomatic from PAD).  Aspirin $Remove'81mg'sAllVNC$  PO QD.  Atorvastatin 40-$RemoveBeforeDE'80mg'sOOOkIjBKhrFRIU$  PO QD (or other "high intensity" statin therapy).  Plan right lower extremity angiogram with possible intervention via left common femoral approach in cath lab 05/31/22.   Plan to use CO2 predominantly. Will use minimal dye. Counseled she may become dialysis dependent after angiogram. Given limb risk, I think this is a risk we have to accept.   CHIEF COMPLAINT: left great toe ulcer  HISTORY OF PRESENT ILLNESS: Robin Brewer is a 76 y.o. female referred to clinic for evaluation of left great toe ulcer.  Patient was seen by a podiatrist in the [facility], who recommended vascular evaluation. The patient has a known history of chronic kidney disease, and has been seen in our office for creation of dialysis access. She has not yet started dialysis. She reports no antecedant claudication or ischemic rest pain prior to development of ischemic ulceration several weeks ago.  VASCULAR SURGICAL HISTORY: Right first stage basilic vein transposition 10/20/2020.  VASCULAR RISK FACTORS: Negative history of stroke / transient ischemic attack. Negative history of  coronary artery disease.  Positive history of diabetes mellitus. Last A1c 8.7. Positive history of smoking. Not actively smoking. Positive history of hypertension.  Positive history of chronic kidney disease.  Last GFR 18 (3 years ago).  Positive history of chronic obstructive pulmonary disease.  FUNCTIONAL STATUS: ECOG performance status: (3) Capable of limited self-care, confined to bed or chair > 50% of waking hours Ambulatory status: Minimally ambulatory (e.g. about the home only)  Past Medical History:  Diagnosis Date   Acute encephalopathy 08/17/2013   Anemia    Anxiety    Anxiety state, unspecified 08/17/2013   ARF (acute renal failure) ([place]) 08/17/2013   Arthritis    Atrial fibrillation (HCC)    Bronchitis    hx of    CHF (congestive heart failure) ([place])    Chronic kidney disease    Chronic pain syndrome 08/17/2013   CKD stage 5 secondary to hypertension ([place]) 07/05/2020   COPD (chronic obstructive pulmonary disease) ([place])    Depression    Diabetes ([place]) 08/17/2013   Diabetes mellitus without complication (HCC)    type 2   Dysrhythmia    a-fib   Early cataracts, bilateral    Essential hypertension 08/17/2013   GERD (gastroesophageal reflux disease)    Headache    HTN (hypertension) 08/17/2013   Hyperlipidemia    Hypertension    Hypothyroidism    OA (osteoarthritis) of hip 08/12/2013   Paroxysmal atrial fibrillation ([place]) 04/19/2020   Right carotid bruit    S/P total hip arthroplasty 08/17/2013   Right. 08/12/13   Shortness of breath    with exertion    Unspecified hypothyroidism 08/17/2013  Wears glasses     Past Surgical History:  Procedure Laterality Date   ABDOMINAL HYSTERECTOMY     AV FISTULA PLACEMENT Right 10/20/2020   Procedure: RIGHT ARM ARTERIOVENOUS (AV) FISTULA CREATION;  Surgeon: [name], MD;  Location: [place];  Service: Vascular;  Laterality: Right;   BACK SURGERY     CHOLECYSTECTOMY     COLONOSCOPY     NECK SURGERY     TOTAL HIP ARTHROPLASTY  Right 08/12/2013   Procedure: RIGHT TOTAL HIP ARTHROPLASTY ANTERIOR APPROACH;  Surgeon: [name], MD;  Location: [facility];  Service: Orthopedics;  Laterality: Right;    Family History  Problem Relation Age of Onset   Transient ischemic attack Mother    Heart disease Mother     Social History   Socioeconomic History   Marital status: Married    Spouse name: Not on file   Number of children: Not on file   Years of education: Not on file   Highest education level: Not on file  Occupational History   Not on file  Tobacco Use   Smoking status: Former    Packs/day: 0.25    Types: Cigarettes   Smokeless tobacco: Never  Vaping Use   Vaping Use: Never used  Substance and Sexual Activity   Alcohol use: No   Drug use: No   Sexual activity: Not Currently    Birth control/protection: Surgical    Comment: Hysterectomy  Other Topics Concern   Not on file  Social History Narrative   Not on file   Social Determinants of Health   Financial Resource Strain: Not on file  Food Insecurity: Not on file  Transportation Needs: Not on file  Physical Activity: Not on file  Stress: Not on file  Social Connections: Not on file  Intimate Partner Violence: Not on file    Allergies  Allergen Reactions   Aspirin     Upset stomach    Niacin And Related Hives    Current Outpatient Medications  Medication Sig Dispense Refill   albuterol (VENTOLIN HFA) 108 (90 Base) MCG/ACT inhaler Inhale 2 puffs into the lungs every 6 (six) hours as needed for wheezing.     apixaban (ELIQUIS) 5 MG TABS tablet Take 5 mg by mouth 2 (two) times daily.   12   calcitRIOL (ROCALTROL) 0.25 MCG capsule Take 0.25 mcg by mouth 2 (two) times daily.     furosemide (LASIX) 80 MG tablet Take 80 mg by mouth daily.     labetalol (NORMODYNE) 300 MG tablet Take 150 mg by mouth 2 (two) times daily.  12   LEVEMIR FLEXTOUCH 100 UNIT/ML FlexPen Inject 50 Units into the skin at bedtime.     levothyroxine (SYNTHROID,  LEVOTHROID) 50 MCG tablet Take 50 mcg by mouth daily before breakfast.     mirtazapine (REMERON) 15 MG tablet Take 7.5 mg by mouth at bedtime.     omega-3 acid ethyl esters (LOVAZA) 1 g capsule Take 1 g by mouth 2 (two) times daily.      omeprazole (PRILOSEC) 20 MG capsule Take 20 mg by mouth daily.      oxyCODONE-acetaminophen (PERCOCET/ROXICET) 5-325 MG tablet Take 1 tablet by mouth every 6 (six) hours as needed for severe pain. (Patient taking differently: Take 1 tablet by mouth every 8 (eight) hours as needed for severe pain.) 10 tablet 0   ranolazine (RANEXA) 500 MG 12 hr tablet Take 500 mg by mouth 2 (two) times daily.  rosuvastatin (CRESTOR) 40 MG tablet Take 40 mg by mouth every evening.      sertraline (ZOLOFT) 100 MG tablet Take 100 mg by mouth every evening.     insulin aspart (NOVOLOG FLEXPEN) 100 UNIT/ML FlexPen Inject 10-12 Units into the skin 3 (three) times daily as needed for high blood sugar.     levocetirizine (XYZAL) 5 MG tablet Take 5 mg by mouth every evening.     mupirocin ointment (BACTROBAN) 2 % Apply 1 application  topically daily.     No current facility-administered medications for this visit.   Facility-Administered Medications Ordered in Other Visits  Medication Dose Route Frequency Provider Last Rate Last Admin   hydrALAZINE (APRESOLINE) injection   Intravenous Anesthesia Intra-op Genelle Bal, CRNA   10 mg at 08/11/20 1400    PHYSICAL EXAM Vitals:   05/29/22 1110  BP: 135/81  Pulse: 89  Resp: 20  Temp: (!) 97.3 F (36.3 C)  SpO2: 99%  Weight: 151 lb (68.5 kg)  Height: $Remove'5\' 2"'zcPtzoP$  (1.575 m)    Constitutional: Chronically ill-appearing.  Cardiac: Regular rate and rhythm.  Respiratory:  unlabored. Abdominal:  soft, non-tender, non-distended.  Peripheral vascular: No palpable pedal pulses.  Left great toe ulcerated about the nailbed.  Small ulcer about the left second toe.  PERTINENT LABORATORY AND RADIOLOGIC DATA  Most recent CBC    Latest  Ref Rng & Units 10/20/2020   10:41 AM 08/11/2020    2:27 PM 12/16/2018    3:08 PM  CBC  WBC 3.4 - 10.8 x10E3/uL   8.6   Hemoglobin 12.0 - 15.0 g/dL 11.6  10.5  11.4   Hematocrit 36.0 - 46.0 % 34.0  31.0  33.5   Platelets 150 - 450 x10E3/uL   174      Most recent CMP    Latest Ref Rng & Units 10/20/2020   10:41 AM 08/11/2020    2:27 PM 01/05/2019    1:33 PM  CMP  Glucose 70 - 99 mg/dL 142  190  349   BUN 8 - 23 mg/dL 53  43  31   Creatinine 0.44 - 1.00 mg/dL 5.30  4.20  2.59   Sodium 135 - 145 mmol/L 141  143  140   Potassium 3.5 - 5.1 mmol/L 3.7  4.3  4.5   Chloride 98 - 111 mmol/L 105  106  98   CO2 20 - 29 mmol/L   21   Calcium 8.7 - 10.3 mg/dL   7.5     Renal function CrCl cannot be calculated (Patient's most recent lab result is older than the maximum 21 days allowed.).  Hgb A1c MFr Bld (%)  Date Value  08/16/2013 8.7 (H)   Vascular Imaging:  +-------+-----------+-----------+------------+------------+  ABI/TBIToday's ABIToday's TBIPrevious ABIPrevious TBI  +-------+-----------+-----------+------------+------------+  Right  0.42       absent                               +-------+-----------+-----------+------------+------------+  Left   0.37       bandage                              +-------+-----------+-----------+------------+------------+   Yevonne Aline. Stanford Breed, MD [facility] Phone Number: [phone] 05/29/2022 4:40 PM  Total time spent on preparing this encounter including chart review, data review, collecting history, examining the  patient, coordinating care for this new patient, 60 minutes.  Portions of this report may have been transcribed using voice recognition software.  Every effort has been made to ensure accuracy; however, inadvertent computerized transcription errors may still be present.

## 2022-05-29 ENCOUNTER — Ambulatory Visit: Payer: PPO | Admitting: Vascular Surgery

## 2022-05-29 ENCOUNTER — Other Ambulatory Visit: Payer: Self-pay

## 2022-05-29 ENCOUNTER — Encounter: Payer: Self-pay | Admitting: Vascular Surgery

## 2022-05-29 ENCOUNTER — Ambulatory Visit (HOSPITAL_COMMUNITY)
Admission: RE | Admit: 2022-05-29 | Discharge: 2022-05-29 | Disposition: A | Discharge status: Home / Self Care | Payer: PPO | Source: Ambulatory Visit | Attending: Vascular Surgery | Admitting: Vascular Surgery

## 2022-05-29 VITALS — BP 135/81 | HR 89 | Temp 97.3°F | Resp 20 | Ht 62.0 in | Wt 151.0 lb

## 2022-05-29 DIAGNOSIS — I739 Peripheral vascular disease, unspecified: Secondary | ICD-10-CM | POA: Diagnosis not present

## 2022-05-29 DIAGNOSIS — I70245 Atherosclerosis of native arteries of left leg with ulceration of other part of foot: Secondary | ICD-10-CM | POA: Diagnosis not present

## 2022-05-29 DIAGNOSIS — I120 Hypertensive chronic kidney disease with stage 5 chronic kidney disease or end stage renal disease: Secondary | ICD-10-CM | POA: Diagnosis not present

## 2022-05-29 DIAGNOSIS — I7025 Atherosclerosis of native arteries of other extremities with ulceration: Secondary | ICD-10-CM

## 2022-05-29 DIAGNOSIS — N185 Chronic kidney disease, stage 5: Secondary | ICD-10-CM | POA: Diagnosis not present

## 2022-05-29 DIAGNOSIS — D631 Anemia in chronic kidney disease: Secondary | ICD-10-CM | POA: Diagnosis not present

## 2022-05-29 DIAGNOSIS — N2581 Secondary hyperparathyroidism of renal origin: Secondary | ICD-10-CM | POA: Diagnosis not present

## 2022-05-31 ENCOUNTER — Observation Stay (HOSPITAL_COMMUNITY)
Admission: RE | Admit: 2022-05-31 | Discharge: 2022-06-01 | Disposition: A | Discharge status: Home / Self Care | Payer: PPO | Attending: Vascular Surgery | Admitting: Vascular Surgery

## 2022-05-31 ENCOUNTER — Other Ambulatory Visit: Payer: Self-pay

## 2022-05-31 ENCOUNTER — Encounter (HOSPITAL_COMMUNITY)
Admission: RE | Disposition: A | Discharge status: Home / Self Care | Payer: Self-pay | Source: Home / Self Care | Attending: Vascular Surgery

## 2022-05-31 DIAGNOSIS — I509 Heart failure, unspecified: Secondary | ICD-10-CM | POA: Insufficient documentation

## 2022-05-31 DIAGNOSIS — I480 Paroxysmal atrial fibrillation: Secondary | ICD-10-CM | POA: Insufficient documentation

## 2022-05-31 DIAGNOSIS — L97529 Non-pressure chronic ulcer of other part of left foot with unspecified severity: Secondary | ICD-10-CM | POA: Diagnosis not present

## 2022-05-31 DIAGNOSIS — E039 Hypothyroidism, unspecified: Secondary | ICD-10-CM | POA: Diagnosis not present

## 2022-05-31 DIAGNOSIS — E1151 Type 2 diabetes mellitus with diabetic peripheral angiopathy without gangrene: Secondary | ICD-10-CM | POA: Diagnosis not present

## 2022-05-31 DIAGNOSIS — E11621 Type 2 diabetes mellitus with foot ulcer: Secondary | ICD-10-CM | POA: Insufficient documentation

## 2022-05-31 DIAGNOSIS — I70245 Atherosclerosis of native arteries of left leg with ulceration of other part of foot: Secondary | ICD-10-CM | POA: Diagnosis not present

## 2022-05-31 DIAGNOSIS — Z87891 Personal history of nicotine dependence: Secondary | ICD-10-CM | POA: Diagnosis not present

## 2022-05-31 DIAGNOSIS — Z7982 Long term (current) use of aspirin: Secondary | ICD-10-CM | POA: Insufficient documentation

## 2022-05-31 DIAGNOSIS — I70222 Atherosclerosis of native arteries of extremities with rest pain, left leg: Secondary | ICD-10-CM

## 2022-05-31 DIAGNOSIS — E1122 Type 2 diabetes mellitus with diabetic chronic kidney disease: Secondary | ICD-10-CM | POA: Diagnosis not present

## 2022-05-31 DIAGNOSIS — Z79899 Other long term (current) drug therapy: Secondary | ICD-10-CM | POA: Diagnosis not present

## 2022-05-31 DIAGNOSIS — J449 Chronic obstructive pulmonary disease, unspecified: Secondary | ICD-10-CM | POA: Diagnosis not present

## 2022-05-31 DIAGNOSIS — N185 Chronic kidney disease, stage 5: Secondary | ICD-10-CM | POA: Insufficient documentation

## 2022-05-31 DIAGNOSIS — I7025 Atherosclerosis of native arteries of other extremities with ulceration: Secondary | ICD-10-CM

## 2022-05-31 DIAGNOSIS — I132 Hypertensive heart and chronic kidney disease with heart failure and with stage 5 chronic kidney disease, or end stage renal disease: Secondary | ICD-10-CM | POA: Insufficient documentation

## 2022-05-31 HISTORY — PX: ABDOMINAL AORTOGRAM W/LOWER EXTREMITY: CATH118223

## 2022-05-31 LAB — GLUCOSE, CAPILLARY
Glucose-Capillary: 124 mg/dL — ABNORMAL HIGH (ref 70–99)
Glucose-Capillary: 123 mg/dL — ABNORMAL HIGH (ref 70–99)
Glucose-Capillary: 182 mg/dL — ABNORMAL HIGH (ref 70–99)

## 2022-05-31 LAB — CBC
HCT: 20.2 % — ABNORMAL LOW (ref 36.0–46.0)
Hemoglobin: 6.4 g/dL — CL (ref 12.0–15.0)
MCH: 29.9 pg (ref 26.0–34.0)
MCHC: 31.7 g/dL (ref 30.0–36.0)
MCV: 94.4 fL (ref 80.0–100.0)
Platelets: 134 10*3/uL — ABNORMAL LOW (ref 150–400)
RBC: 2.14 MIL/uL — ABNORMAL LOW (ref 3.87–5.11)
RDW: 13.8 % (ref 11.5–15.5)
WBC: 8.2 10*3/uL (ref 4.0–10.5)
nRBC: 0 % (ref 0.0–0.2)

## 2022-05-31 LAB — POCT I-STAT, CHEM 8
BUN: 86 mg/dL — ABNORMAL HIGH (ref 8–23)
Calcium, Ion: 1.14 mmol/L — ABNORMAL LOW (ref 1.15–1.40)
Chloride: 104 mmol/L (ref 98–111)
Creatinine, Ser: 7.3 mg/dL — ABNORMAL HIGH (ref 0.44–1.00)
Glucose, Bld: 126 mg/dL — ABNORMAL HIGH (ref 70–99)
HCT: 25 % — ABNORMAL LOW (ref 36.0–46.0)
Hemoglobin: 8.5 g/dL — ABNORMAL LOW (ref 12.0–15.0)
Potassium: 4 mmol/L (ref 3.5–5.1)
Sodium: 139 mmol/L (ref 135–145)
TCO2: 21 mmol/L — ABNORMAL LOW (ref 22–32)

## 2022-05-31 LAB — POCT ACTIVATED CLOTTING TIME
Activated Clotting Time: 269 seconds
Activated Clotting Time: 179 seconds

## 2022-05-31 LAB — PREPARE RBC (CROSSMATCH)

## 2022-05-31 SURGERY — ABDOMINAL AORTOGRAM W/LOWER EXTREMITY
Anesthesia: LOCAL

## 2022-05-31 MED ORDER — MIDAZOLAM HCL 2 MG/2ML IJ SOLN
INTRAMUSCULAR | Status: AC
  Filled 2022-05-31: qty 2

## 2022-05-31 MED ORDER — LIDOCAINE HCL (PF) 1 % IJ SOLN
INTRAMUSCULAR | Status: DC | PRN
  Administered 2022-05-31: 20 mL via INTRADERMAL

## 2022-05-31 MED ORDER — FENTANYL CITRATE (PF) 100 MCG/2ML IJ SOLN
INTRAMUSCULAR | Status: AC
  Filled 2022-05-31: qty 2

## 2022-05-31 MED ORDER — FENTANYL CITRATE (PF) 100 MCG/2ML IJ SOLN
INTRAMUSCULAR | Status: DC | PRN
  Administered 2022-05-31: 50 ug via INTRAVENOUS
  Administered 2022-05-31: 25 ug via INTRAVENOUS

## 2022-05-31 MED ORDER — SODIUM CHLORIDE 0.9 % WEIGHT BASED INFUSION
1.0000 mL/kg/h | INTRAVENOUS | Status: DC
  Administered 2022-05-31: 1 mL/kg/h via INTRAVENOUS

## 2022-05-31 MED ORDER — SODIUM CHLORIDE 0.9% FLUSH: 3.0000 mL | Freq: Two times a day (BID) | INTRAVENOUS | Status: DC

## 2022-05-31 MED ORDER — SODIUM CHLORIDE 0.9 % IV SOLN: 250.0000 mL | INTRAVENOUS | Status: DC | PRN

## 2022-05-31 MED ORDER — HEPARIN (PORCINE) IN NACL 1000-0.9 UT/500ML-% IV SOLN
INTRAVENOUS | Status: DC | PRN
  Administered 2022-05-31 (×2): 500 mL

## 2022-05-31 MED ORDER — ACETAMINOPHEN 325 MG PO TABS
650.0000 mg | ORAL_TABLET | ORAL | Status: DC | PRN
  Administered 2022-05-31 – 2022-06-01 (×2): 650 mg via ORAL
  Filled 2022-05-31 (×2): qty 2

## 2022-05-31 MED ORDER — LABETALOL HCL 5 MG/ML IV SOLN: 10.0000 mg | INTRAVENOUS | Status: DC | PRN

## 2022-05-31 MED ORDER — IODIXANOL 320 MG/ML IV SOLN
INTRAVENOUS | Status: DC | PRN
  Administered 2022-05-31: 12 mL via INTRA_ARTERIAL

## 2022-05-31 MED ORDER — HEPARIN (PORCINE) IN NACL 1000-0.9 UT/500ML-% IV SOLN
INTRAVENOUS | Status: AC
Start: 2022-05-31 — End: ?
  Filled 2022-05-31: qty 1000

## 2022-05-31 MED ORDER — SODIUM CHLORIDE 0.9% IV SOLUTION: Freq: Once | INTRAVENOUS | Status: AC

## 2022-05-31 MED ORDER — LIDOCAINE HCL (PF) 1 % IJ SOLN
INTRAMUSCULAR | Status: AC
  Filled 2022-05-31: qty 30

## 2022-05-31 MED ORDER — SODIUM CHLORIDE 0.9 % IV BOLUS
500.0000 mL | Freq: Once | INTRAVENOUS | Status: AC
Start: 2022-05-31 — End: 2022-06-01
  Administered 2022-05-31: 500 mL via INTRAVENOUS

## 2022-05-31 MED ORDER — HYDRALAZINE HCL 20 MG/ML IJ SOLN: 5.0000 mg | INTRAMUSCULAR | Status: DC | PRN

## 2022-05-31 MED ORDER — SODIUM CHLORIDE 0.9 % IV SOLN: INTRAVENOUS | Status: DC

## 2022-05-31 MED ORDER — FENTANYL CITRATE (PF) 100 MCG/2ML IJ SOLN
25.0000 ug | INTRAMUSCULAR | Status: DC | PRN
  Administered 2022-05-31 (×3): 25 ug via INTRAVENOUS
  Filled 2022-05-31: qty 2

## 2022-05-31 MED ORDER — HEPARIN SODIUM (PORCINE) 1000 UNIT/ML IJ SOLN
INTRAMUSCULAR | Status: DC | PRN
  Administered 2022-05-31: 6000 [IU] via INTRAVENOUS

## 2022-05-31 MED ORDER — ONDANSETRON HCL 4 MG/2ML IJ SOLN: 4.0000 mg | Freq: Four times a day (QID) | INTRAMUSCULAR | Status: DC | PRN

## 2022-05-31 MED ORDER — MIDAZOLAM HCL 2 MG/2ML IJ SOLN
INTRAMUSCULAR | Status: DC | PRN
  Administered 2022-05-31: 1 mg via INTRAVENOUS

## 2022-05-31 MED ORDER — SODIUM CHLORIDE 0.9% FLUSH: 3.0000 mL | INTRAVENOUS | Status: DC | PRN

## 2022-05-31 SURGICAL SUPPLY — 15 items
CATH OMNI FLUSH 5F 65CM (CATHETERS) ×1 IMPLANT
CATH QUICKCROSS .035X135CM (MICROCATHETER) ×1 IMPLANT
DEVICE CLOSURE MYNXGRIP 6/7F (Vascular Products) ×1 IMPLANT
DEVICE CONTINUOUS FLUSH (MISCELLANEOUS) ×1 IMPLANT
GLIDEWIRE ADV .035X260CM (WIRE) ×1 IMPLANT
KIT ANGIASSIST CO2 SYSTEM (KITS) ×1 IMPLANT
KIT MICROPUNCTURE NIT STIFF (SHEATH) ×1 IMPLANT
KIT PV (KITS) ×2 IMPLANT
SHEATH PINNACLE 5F 10CM (SHEATH) ×1 IMPLANT
SHEATH PINNACLE 6F 10CM (SHEATH) ×1 IMPLANT
SHEATH PINNACLE MP 6F 45CM (SHEATH) ×1 IMPLANT
SYR MEDRAD MARK V 150ML (SYRINGE) ×1 IMPLANT
TRANSDUCER W/STOPCOCK (MISCELLANEOUS) ×2 IMPLANT
TRAY PV CATH (CUSTOM PROCEDURE TRAY) ×2 IMPLANT
WIRE BENTSON .035X145CM (WIRE) ×1 IMPLANT

## 2022-05-31 NOTE — Op Note (Signed)
    Patient name: Robin Brewer MRN: [id number] DOB: [birth year] Sex: female  05/31/2022 Pre-operative Diagnosis: Left lower extremity Rutherford 5 critical limb ischemia Post-operative diagnosis:  Same Surgeon:  [name], MD Procedure Performed: 1.  Ultrasound-guided micropuncture access of the right common femoral artery 2.  Aortogram 3.  Third-order cannulation, left lower extremity angiogram 4.  Manual pressure closure 5.  Moderate sedation time 41 minutes 6.  Total contrast volume 12 mL  Indications: Patient is a 76 year old female originally seen by my partner Dr. Stanford Breed with left lower extremity Rutherford 5 critical limb ischemia.  Comorbidities include diabetes and CKD 5.  After discussing the risks and benefits of left lower extremity angiogram in an effort to define and improve distal perfusion for wound healing, [name] elected to proceed.   Findings:  No flow-limiting stenosis within the aortoiliac segment bilaterally On the right: Patent common femoral artery, patent profunda, severely diseased superficial femoral artery with multiple, tandem flow-limiting stenoses.  The popliteal artery was patent without significant stenosis.  Distally, there was no outflow to the foot.  The anterior tibial artery occluded after 3 cm giving off several collaterals.  The peroneal artery occluded in the mid tibia and the posterior tibial artery was not appreciated.  Minimal opacification in the foot.    Procedure:  The patient was identified in the holding area and taken to room 8.  The patient was then placed supine on the table and prepped and draped in the usual sterile fashion.  A time out was called.  Ultrasound was used to evaluate the right common femoral artery.  It was patent .  A digital ultrasound image was acquired.  A micropuncture needle was used to access the right common femoral artery under ultrasound guidance.  An 018 wire was advanced without resistance and a  micropuncture sheath was placed.  The 018 wire was removed and a benson wire was placed.  The micropuncture sheath was exchanged for a 5 french sheath.  An omniflush catheter was advanced over the wire to the level of L-1.  An abdominal angiogram was obtained using CO2 angiography.  Next, using the omniflush catheter and a benson wire, the aortic bifurcation was crossed and the catheter was placed into the left external iliac artery and left runoff was obtained with CO2 angiography.  In an effort to assess outflow, a 6 x 45 cm sheath was parked in the left common femoral artery.  A series of wires and catheters were used to navigate the superficial femoral artery and a quick cross catheter was parked in the P2 segment of the popliteal artery.  At this point, 12 cc of contrast were utilized to assess for possible distal targets.  There were none appreciated.  Please see findings above.  Impression: Severe atherosclerotic disease in the left lower extremity with no named vessels in the calf and no outflow to the foot. I had a long conversation with both the patient and her family.  Should her rest pain or tissue loss worsen, the only therapeutic option would be above-knee amputation.   Cassandria Santee, MD [facility] [place] Office: [phone]

## 2022-05-31 NOTE — Progress Notes (Signed)
Per Virl Cagey, MD, patient can restart Eliquis tomorrow. Patient and family notified and verbalized understanding.

## 2022-05-31 NOTE — Progress Notes (Signed)
Dr. Virl Cagey to bedside to see pt, no new orders at this time

## 2022-05-31 NOTE — Progress Notes (Signed)
500 cc NS bolus given. Patient reports feeling better and blood pressure is stable.

## 2022-05-31 NOTE — Progress Notes (Addendum)
SITE AREA: right groin  SITE PRIOR TO REMOVAL:  LEVEL 0  PRESSURE APPLIED FOR: approximately 40 minutes   MANUAL: yes  PATIENT STATUS DURING PULL: stable, + pain, see MAR and flowsheets  POST PULL SITE:  LEVEL 0  POST PULL INSTRUCTIONS GIVEN: yes  POST PULL PULSES PRESENT: LLE absent, RLE dopplerable, see previous assessment, no change  DRESSING APPLIED: gauze with tegaderm  BEDREST BEGINS @ 1630  COMMENTS:  pure wick placed by Beverely Risen., RN during sheath removal, level 1 hematoma noted to right groin area during initial removal of sheath, expressed, Dr. Virl Cagey notified, small pressure drsg placed to right groin after manual pressure completed by Beverely Risen., RN at [phone], S. Yogesh Cominsky and Suella Broad, RN's both held manual pressure to right groin

## 2022-05-31 NOTE — Progress Notes (Signed)
Once bedrest was up at 2030, patient ambulated to bedside commode. Approximately one minute later patient complained of dizziness, lightheadedness, and diaphoresis. B/P checked and it was 71/55. Patient assisted back to bed and placed in trendelenburg. B/P came up to 135/65. Patient reported feeling much better. Virl Cagey, MD notified. MD gave verbal order for 500 cc NS bolus and a CBC. Patient will be admitted. MD to place orders.

## 2022-05-31 NOTE — H&P (Signed)
[facility] [place]  Patient seen and examined in preop holding.  No complaints. No changes to medication history or physical exam since last seen in clinic. After discussing the risks and benefits of left lower extremity angiogram for Rutherford 5 critical limb ischemia, Robin Brewer elected to proceed.   [name] MD   ASSESSMENT / PLAN: Robin Brewer is a 76 y.o. female with atherosclerosis of native arteries of left lower extremity causing ulceration.  Patient counseled patients with chronic limb threatening ischemia have an annual risk of cardiovascular mortality of 25% and a high risk of amputation.   WIfI score: 1 / 3 / 1. Clinical stage III. Moderate amputation risk. High benefit from revascularization.   Recommend the following which can slow the progression of atherosclerosis and reduce the risk of major adverse cardiac / limb events:  Complete cessation from all tobacco products. Blood glucose control with goal A1c < 7%. Blood pressure control with goal blood pressure < 140/90 mmHg. Lipid reduction therapy with goal LDL-C <100 mg/dL (<70 if symptomatic from PAD).  Aspirin $Remove'81mg'MUYOTUQ$  PO QD.  Atorvastatin 40-$RemoveBeforeDE'80mg'KCGkcECPUcoipdq$  PO QD (or other "high intensity" statin therapy).  Plan right lower extremity angiogram with possible intervention via left common femoral approach in cath lab 05/31/22.   Plan to use CO2 predominantly. Will use minimal dye. Counseled she may become dialysis dependent after angiogram. Given limb risk, I think this is a risk we have to accept.   CHIEF COMPLAINT: left great toe ulcer  HISTORY OF PRESENT ILLNESS: Robin Brewer is a 76 y.o. female referred to clinic for evaluation of left great toe ulcer.  Patient was seen by a podiatrist in the [facility], who recommended vascular evaluation. The patient has a known history of chronic kidney disease, and has been seen in our office for creation of dialysis access. She has not yet started  dialysis. She reports no antecedant claudication or ischemic rest pain prior to development of ischemic ulceration several weeks ago.  VASCULAR SURGICAL HISTORY: Right first stage basilic vein transposition 10/20/2020.  VASCULAR RISK FACTORS: Negative history of stroke / transient ischemic attack. Negative history of coronary artery disease.  Positive history of diabetes mellitus. Last A1c 8.7. Positive history of smoking. Not actively smoking. Positive history of hypertension.  Positive history of chronic kidney disease.  Last GFR 18 (3 years ago).  Positive history of chronic obstructive pulmonary disease.  FUNCTIONAL STATUS: ECOG performance status: (3) Capable of limited self-care, confined to bed or chair > 50% of waking hours Ambulatory status: Minimally ambulatory (e.g. about the home only)  Past Medical History:  Diagnosis Date   Acute encephalopathy 08/17/2013   Anemia    Anxiety    Anxiety state, unspecified 08/17/2013   ARF (acute renal failure) ([place]) 08/17/2013   Arthritis    Atrial fibrillation (HCC)    Bronchitis    hx of    CHF (congestive heart failure) ([place])    Chronic kidney disease    Chronic pain syndrome 08/17/2013   CKD stage 5 secondary to hypertension ([place]) 07/05/2020   COPD (chronic obstructive pulmonary disease) ([place])    Depression    Diabetes ([place]) 08/17/2013   Diabetes mellitus without complication (HCC)    type 2   Dysrhythmia    a-fib   Early cataracts, bilateral    Essential hypertension 08/17/2013   GERD (gastroesophageal reflux disease)    Headache    HTN (hypertension) 08/17/2013   Hyperlipidemia    Hypertension  Hypothyroidism    OA (osteoarthritis) of hip 08/12/2013   Paroxysmal atrial fibrillation ([place]) 04/19/2020   Right carotid bruit    S/P total hip arthroplasty 08/17/2013   Right. 08/12/13   Shortness of breath    with exertion    Unspecified hypothyroidism 08/17/2013   Wears glasses     Past Surgical History:  Procedure Laterality Date    ABDOMINAL HYSTERECTOMY     AV FISTULA PLACEMENT Right 10/20/2020   Procedure: RIGHT ARM ARTERIOVENOUS (AV) FISTULA CREATION;  Surgeon: [name], MD;  Location: [place];  Service: Vascular;  Laterality: Right;   BACK SURGERY     CHOLECYSTECTOMY     COLONOSCOPY     NECK SURGERY     TOTAL HIP ARTHROPLASTY Right 08/12/2013   Procedure: RIGHT TOTAL HIP ARTHROPLASTY ANTERIOR APPROACH;  Surgeon: [name], MD;  Location: [facility];  Service: Orthopedics;  Laterality: Right;    Family History  Problem Relation Age of Onset   Transient ischemic attack Mother    Heart disease Mother     Social History   Socioeconomic History   Marital status: Married    Spouse name: Not on file   Number of children: Not on file   Years of education: Not on file   Highest education level: Not on file  Occupational History   Not on file  Tobacco Use   Smoking status: Former    Packs/day: 0.25    Types: Cigarettes   Smokeless tobacco: Never  Vaping Use   Vaping Use: Never used  Substance and Sexual Activity   Alcohol use: No   Drug use: No   Sexual activity: Not Currently    Birth control/protection: Surgical    Comment: Hysterectomy  Other Topics Concern   Not on file  Social History Narrative   Not on file   Social Determinants of Health   Financial Resource Strain: Not on file  Food Insecurity: Not on file  Transportation Needs: Not on file  Physical Activity: Not on file  Stress: Not on file  Social Connections: Not on file  Intimate Partner Violence: Not on file    Allergies  Allergen Reactions   Aspirin     Upset stomach    Niacin And Related Hives    Current Facility-Administered Medications  Medication Dose Route Frequency Provider Last Rate Last Admin   0.9 %  sodium chloride infusion   Intravenous Continuous Cherre Domenico Achord, MD 100 mL/hr at 05/31/22 0945 New Bag at 05/31/22 0945   [START ON 06/01/2022] 0.9 %  sodium chloride infusion  250 mL Intravenous PRN  [name], MD       0.9% sodium chloride infusion  1 mL/kg/hr Intravenous Continuous [name], MD 68 mL/hr at 05/31/22 1416 1 mL/kg/hr at 05/31/22 1416   acetaminophen (TYLENOL) tablet 650 mg  650 mg Oral Q4H PRN [name], MD   650 mg at 05/31/22 1801   fentaNYL (SUBLIMAZE) injection 25 mcg  25 mcg Intravenous Q2H PRN [name], MD   25 mcg at 05/31/22 1557   fentaNYL (SUBLIMAZE) injection    PRN [name], MD   25 mcg at 05/31/22 1304   Heparin (Porcine) in NaCl 1000-0.9 UT/500ML-% SOLN    PRN [name], MD   500 mL at 05/31/22 1223   heparin sodium (porcine) injection    PRN [name], MD   6,000 Units at 05/31/22 1240   hydrALAZINE (APRESOLINE) injection  5 mg  5 mg Intravenous Q20 Min PRN [name], MD       iodixanol (VISIPAQUE) 320 MG/ML injection    PRN [name], MD   12 mL at 05/31/22 1305   labetalol (NORMODYNE) injection 10 mg  10 mg Intravenous Q10 min PRN [name], MD       lidocaine (PF) (XYLOCAINE) 1 % injection    PRN [name], MD   20 mL at 05/31/22 1218   midazolam (VERSED) injection    PRN [name], MD   1 mg at 05/31/22 1218   ondansetron (ZOFRAN) injection 4 mg  4 mg Intravenous Q6H PRN [name], MD       sodium chloride 0.9 % bolus 500 mL  500 mL Intravenous Once [name], MD       [START ON 06/01/2022] sodium chloride flush (NS) 0.9 % injection 3 mL  3 mL Intravenous Q12H [name], MD       [START ON 06/01/2022] sodium chloride flush (NS) 0.9 % injection 3 mL  3 mL Intravenous PRN [name], MD       Facility-Administered Medications Ordered in Other Encounters  Medication Dose Route Frequency Provider Last Rate Last Admin   hydrALAZINE (APRESOLINE) injection   Intravenous Anesthesia Intra-op Genelle Bal, CRNA   10 mg at 08/11/20 1400    PHYSICAL EXAM Vitals:   05/31/22 2043 05/31/22 2045 05/31/22 2049 05/31/22 2050  BP: (!) 71/42 119/66 136/61  (!) 147/77  Pulse:  (!) 123 (!) 111 (!) 122  Resp:      Temp:      TempSrc:      SpO2:  100% 100% 100%  Weight:      Height:        Constitutional: Chronically ill-appearing.  Cardiac: Regular rate and rhythm.  Respiratory:  unlabored. Abdominal:  soft, non-tender, non-distended.  Peripheral vascular: No palpable pedal pulses.  Left great toe ulcerated about the nailbed.  Small ulcer about the left second toe.  PERTINENT LABORATORY AND RADIOLOGIC DATA  Most recent CBC    Latest Ref Rng & Units 05/31/2022    9:46 AM 10/20/2020   10:41 AM 08/11/2020    2:27 PM  CBC  Hemoglobin 12.0 - 15.0 g/dL 8.5  11.6  10.5   Hematocrit 36.0 - 46.0 % 25.0  34.0  31.0      Most recent CMP    Latest Ref Rng & Units 05/31/2022    9:46 AM 10/20/2020   10:41 AM 08/11/2020    2:27 PM  CMP  Glucose 70 - 99 mg/dL 126  142  190   BUN 8 - 23 mg/dL 86  53  43   Creatinine 0.44 - 1.00 mg/dL 7.30  5.30  4.20   Sodium 135 - 145 mmol/L 139  141  143   Potassium 3.5 - 5.1 mmol/L 4.0  3.7  4.3   Chloride 98 - 111 mmol/L 104  105  106     Renal function Estimated Creatinine Clearance: 6 mL/min (A) (by C-G formula based on SCr of 7.3 mg/dL (H)).  Hgb A1c MFr Bld (%)  Date Value  08/16/2013 8.7 (H)   Vascular Imaging:  +-------+-----------+-----------+------------+------------+  ABI/TBIToday's ABIToday's TBIPrevious ABIPrevious TBI  +-------+-----------+-----------+------------+------------+  Right  0.42       absent                               +-------+-----------+-----------+------------+------------+  Left   0.37       bandage                              +-------+-----------+-----------+------------+------------+   Yevonne Aline. Stanford Breed, MD [facility] Phone Number: [phone] 05/31/2022 9:16 PM  Total time spent on preparing this encounter including chart review, data review, collecting history, examining the patient, coordinating  care for this new patient, 60 minutes.  Portions of this report may have been transcribed using voice recognition software.  Every effort has been made to ensure accuracy; however, inadvertent computerized transcription errors may still be present.

## 2022-05-31 NOTE — Progress Notes (Signed)
Orthostatic hypotension.  No access site hematoma.  Pt's family uncomfortable with discharge. Will observe overnight, CBC, 570ml NS Telemetry   [name] MD

## 2022-05-31 NOTE — Progress Notes (Signed)
[facility] OF [place]  Patient seen and examined in preop holding.  No complaints. No changes to medication history or physical exam since last seen in clinic. After discussing the risks and benefits of left lower extremity angiogram for Rutherford 5 critical limb ischemia, Robin Brewer elected to proceed.   [name] MD   ASSESSMENT / PLAN: Robin Brewer is a 76 y.o. female with atherosclerosis of native arteries of left lower extremity causing ulceration.  Patient counseled patients with chronic limb threatening ischemia have an annual risk of cardiovascular mortality of 25% and a high risk of amputation.   WIfI score: 1 / 3 / 1. Clinical stage III. Moderate amputation risk. High benefit from revascularization.   Recommend the following which can slow the progression of atherosclerosis and reduce the risk of major adverse cardiac / limb events:  Complete cessation from all tobacco products. Blood glucose control with goal A1c < 7%. Blood pressure control with goal blood pressure < 140/90 mmHg. Lipid reduction therapy with goal LDL-C <100 mg/dL (<70 if symptomatic from PAD).  Aspirin $Remove'81mg'ntzDUce$  PO QD.  Atorvastatin 40-$RemoveBeforeDE'80mg'FBltZEkZCFeCZKI$  PO QD (or other "high intensity" statin therapy).  Plan right lower extremity angiogram with possible intervention via left common femoral approach in cath lab 05/31/22.   Plan to use CO2 predominantly. Will use minimal dye. Counseled she may become dialysis dependent after angiogram. Given limb risk, I think this is a risk we have to accept.   CHIEF COMPLAINT: left great toe ulcer  HISTORY OF PRESENT ILLNESS: Robin Brewer is a 76 y.o. female referred to clinic for evaluation of left great toe ulcer.  Patient was seen by a podiatrist in the [facility], who recommended vascular evaluation. The patient has a known history of chronic kidney disease, and has been seen in our office for creation of dialysis access. She has not yet started  dialysis. She reports no antecedant claudication or ischemic rest pain prior to development of ischemic ulceration several weeks ago.  VASCULAR SURGICAL HISTORY: Right first stage basilic vein transposition 10/20/2020.  VASCULAR RISK FACTORS: Negative history of stroke / transient ischemic attack. Negative history of coronary artery disease.  Positive history of diabetes mellitus. Last A1c 8.7. Positive history of smoking. Not actively smoking. Positive history of hypertension.  Positive history of chronic kidney disease.  Last GFR 18 (3 years ago).  Positive history of chronic obstructive pulmonary disease.  FUNCTIONAL STATUS: ECOG performance status: (3) Capable of limited self-care, confined to bed or chair > 50% of waking hours Ambulatory status: Minimally ambulatory (e.g. about the home only)  Past Medical History:  Diagnosis Date   Acute encephalopathy 08/17/2013   Anemia    Anxiety    Anxiety state, unspecified 08/17/2013   ARF (acute renal failure) ([place]) 08/17/2013   Arthritis    Atrial fibrillation (HCC)    Bronchitis    hx of    CHF (congestive heart failure) ([place])    Chronic kidney disease    Chronic pain syndrome 08/17/2013   CKD stage 5 secondary to hypertension ([place]) 07/05/2020   COPD (chronic obstructive pulmonary disease) ([place])    Depression    Diabetes ([place]) 08/17/2013   Diabetes mellitus without complication (HCC)    type 2   Dysrhythmia    a-fib   Early cataracts, bilateral    Essential hypertension 08/17/2013   GERD (gastroesophageal reflux disease)    Headache    HTN (hypertension) 08/17/2013   Hyperlipidemia    Hypertension  Hypothyroidism    OA (osteoarthritis) of hip 08/12/2013   Paroxysmal atrial fibrillation ([place]) 04/19/2020   Right carotid bruit    S/P total hip arthroplasty 08/17/2013   Right. 08/12/13   Shortness of breath    with exertion    Unspecified hypothyroidism 08/17/2013   Wears glasses     Past Surgical History:  Procedure Laterality Date    ABDOMINAL HYSTERECTOMY     AV FISTULA PLACEMENT Right 10/20/2020   Procedure: RIGHT ARM ARTERIOVENOUS (AV) FISTULA CREATION;  Surgeon: [name], MD;  Location: [place];  Service: Vascular;  Laterality: Right;   BACK SURGERY     CHOLECYSTECTOMY     COLONOSCOPY     NECK SURGERY     TOTAL HIP ARTHROPLASTY Right 08/12/2013   Procedure: RIGHT TOTAL HIP ARTHROPLASTY ANTERIOR APPROACH;  Surgeon: [name], MD;  Location: [facility];  Service: Orthopedics;  Laterality: Right;    Family History  Problem Relation Age of Onset   Transient ischemic attack Mother    Heart disease Mother     Social History   Socioeconomic History   Marital status: Married    Spouse name: Not on file   Number of children: Not on file   Years of education: Not on file   Highest education level: Not on file  Occupational History   Not on file  Tobacco Use   Smoking status: Former    Packs/day: 0.25    Types: Cigarettes   Smokeless tobacco: Never  Vaping Use   Vaping Use: Never used  Substance and Sexual Activity   Alcohol use: No   Drug use: No   Sexual activity: Not Currently    Birth control/protection: Surgical    Comment: Hysterectomy  Other Topics Concern   Not on file  Social History Narrative   Not on file   Social Determinants of Health   Financial Resource Strain: Not on file  Food Insecurity: Not on file  Transportation Needs: Not on file  Physical Activity: Not on file  Stress: Not on file  Social Connections: Not on file  Intimate Partner Violence: Not on file    Allergies  Allergen Reactions   Aspirin     Upset stomach    Niacin And Related Hives    Current Facility-Administered Medications  Medication Dose Route Frequency Provider Last Rate Last Admin   0.9 %  sodium chloride infusion   Intravenous Continuous Cherre Geraldy Akridge, MD 100 mL/hr at 05/31/22 0945 New Bag at 05/31/22 0945   0.9% sodium chloride infusion  1 mL/kg/hr Intravenous Continuous [name], MD 68 mL/hr at 05/31/22 1416 1 mL/kg/hr at 05/31/22 1416   acetaminophen (TYLENOL) tablet 650 mg  650 mg Oral Q4H PRN [name], MD       fentaNYL (SUBLIMAZE) injection 25 mcg  25 mcg Intravenous Q2H PRN [name], MD   25 mcg at 05/31/22 1557   fentaNYL (SUBLIMAZE) injection    PRN [name], MD   25 mcg at 05/31/22 1304   Heparin (Porcine) in NaCl 1000-0.9 UT/500ML-% SOLN    PRN [name], MD   500 mL at 05/31/22 1223   heparin sodium (porcine) injection    PRN [name], MD   6,000 Units at 05/31/22 1240   hydrALAZINE (APRESOLINE) injection 5 mg  5 mg Intravenous Q20 Min PRN [name], MD       iodixanol (VISIPAQUE) 320 MG/ML injection  PRN [name], MD   12 mL at 05/31/22 1305   labetalol (NORMODYNE) injection 10 mg  10 mg Intravenous Q10 min PRN [name], MD       lidocaine (PF) (XYLOCAINE) 1 % injection    PRN [name], MD   20 mL at 05/31/22 1218   midazolam (VERSED) injection    PRN [name], MD   1 mg at 05/31/22 1218   ondansetron (ZOFRAN) injection 4 mg  4 mg Intravenous Q6H PRN [name], MD       Facility-Administered Medications Ordered in Other Encounters  Medication Dose Route Frequency Provider Last Rate Last Admin   hydrALAZINE (APRESOLINE) injection   Intravenous Anesthesia Intra-op Genelle Bal, CRNA   10 mg at 08/11/20 1400    PHYSICAL EXAM Vitals:   05/31/22 1625 05/31/22 1630 05/31/22 1635 05/31/22 1640  BP: (!) 137/57 118/73 (!) 119/53 124/65  Pulse: (!) 108 95 (!) 101 (!) 103  Resp: $Remo'14 17 13 15  'FAoiU$ Temp:      TempSrc:      SpO2: 100% 100% 100% 100%  Weight:      Height:        Constitutional: Chronically ill-appearing.  Cardiac: Regular rate and rhythm.  Respiratory:  unlabored. Abdominal:  soft, non-tender, non-distended.  Peripheral vascular: No palpable pedal pulses.  Left great toe ulcerated about the nailbed.  Small ulcer about the left second toe.  PERTINENT  LABORATORY AND RADIOLOGIC DATA  Most recent CBC    Latest Ref Rng & Units 05/31/2022    9:46 AM 10/20/2020   10:41 AM 08/11/2020    2:27 PM  CBC  Hemoglobin 12.0 - 15.0 g/dL 8.5  11.6  10.5   Hematocrit 36.0 - 46.0 % 25.0  34.0  31.0      Most recent CMP    Latest Ref Rng & Units 05/31/2022    9:46 AM 10/20/2020   10:41 AM 08/11/2020    2:27 PM  CMP  Glucose 70 - 99 mg/dL 126  142  190   BUN 8 - 23 mg/dL 86  53  43   Creatinine 0.44 - 1.00 mg/dL 7.30  5.30  4.20   Sodium 135 - 145 mmol/L 139  141  143   Potassium 3.5 - 5.1 mmol/L 4.0  3.7  4.3   Chloride 98 - 111 mmol/L 104  105  106     Renal function Estimated Creatinine Clearance: 6 mL/min (A) (by C-G formula based on SCr of 7.3 mg/dL (H)).  Hgb A1c MFr Bld (%)  Date Value  08/16/2013 8.7 (H)   Vascular Imaging:  +-------+-----------+-----------+------------+------------+  ABI/TBIToday's ABIToday's TBIPrevious ABIPrevious TBI  +-------+-----------+-----------+------------+------------+  Right  0.42       absent                               +-------+-----------+-----------+------------+------------+  Left   0.37       bandage                              +-------+-----------+-----------+------------+------------+   Yevonne Aline. Stanford Breed, MD [facility] Phone Number: [phone] 05/31/2022 5:02 PM  Total time spent on preparing this encounter including chart review, data review, collecting history, examining the patient, coordinating care for this new patient, 60 minutes.  Portions of this  report may have been transcribed using voice recognition software.  Every effort has been made to ensure accuracy; however, inadvertent computerized transcription errors may still be present.

## 2022-05-31 NOTE — Progress Notes (Signed)
Report called and given to [facility] RN. Family and patient notified.

## 2022-05-31 NOTE — Progress Notes (Signed)
CRITICAL RESULT PROVIDER NOTIFICATION  Test performed and critical result: Hgb: 6.4  Date and time result received: 05/31/22 2150  Provider name/title: Virl Cagey, MD  Date and time provider notified: 05/31/22 0953  Date and time provider responded: 05/31/22 0953  Provider response: Verbal order to transfuse one unit of PRBC's. Orders placed. Type and screen collected and sent to blood bank.    Patient is asymptomatic.

## 2022-06-01 ENCOUNTER — Encounter (HOSPITAL_COMMUNITY): Payer: Self-pay | Admitting: Vascular Surgery

## 2022-06-01 ENCOUNTER — Ambulatory Visit: Payer: PPO | Admitting: Cardiology

## 2022-06-01 DIAGNOSIS — E1151 Type 2 diabetes mellitus with diabetic peripheral angiopathy without gangrene: Secondary | ICD-10-CM | POA: Diagnosis not present

## 2022-06-01 LAB — HEMOGLOBIN AND HEMATOCRIT, BLOOD
HCT: 28.2 % — ABNORMAL LOW (ref 36.0–46.0)
Hemoglobin: 9.1 g/dL — ABNORMAL LOW (ref 12.0–15.0)

## 2022-06-01 LAB — GLUCOSE, CAPILLARY: Glucose-Capillary: 201 mg/dL — ABNORMAL HIGH (ref 70–99)

## 2022-06-01 LAB — HEMOGLOBIN A1C
Hgb A1c MFr Bld: 6.5 % — ABNORMAL HIGH (ref 4.8–5.6)
Mean Plasma Glucose: 139.85 mg/dL

## 2022-06-01 MED ORDER — INSULIN ASPART 100 UNIT/ML IJ SOLN
0.0000 [IU] | Freq: Three times a day (TID) | INTRAMUSCULAR | Status: DC
  Administered 2022-06-01: 3 [IU] via SUBCUTANEOUS

## 2022-06-01 MED ORDER — CALCITRIOL 0.25 MCG PO CAPS
0.2500 ug | ORAL_CAPSULE | Freq: Two times a day (BID) | ORAL | Status: DC
  Administered 2022-06-01: 0.25 ug via ORAL
  Filled 2022-06-01: qty 1

## 2022-06-01 MED ORDER — CETIRIZINE HCL 10 MG PO TABS
10.0000 mg | ORAL_TABLET | Freq: Every evening | ORAL | Status: DC
Start: 2022-06-01 — End: 2022-06-01
  Filled 2022-06-01: qty 1

## 2022-06-01 MED ORDER — INSULIN DETEMIR 100 UNIT/ML ~~LOC~~ SOLN
50.0000 [IU] | Freq: Every day | SUBCUTANEOUS | Status: DC
Start: 2022-06-01 — End: 2022-06-01
  Filled 2022-06-01 (×2): qty 0.5

## 2022-06-01 MED ORDER — FUROSEMIDE 40 MG PO TABS
80.0000 mg | ORAL_TABLET | Freq: Every day | ORAL | Status: DC
  Administered 2022-06-01: 80 mg via ORAL
  Filled 2022-06-01: qty 2

## 2022-06-01 MED ORDER — LABETALOL HCL 300 MG PO TABS
150.0000 mg | ORAL_TABLET | Freq: Two times a day (BID) | ORAL | Status: DC
  Administered 2022-06-01: 150 mg via ORAL
  Filled 2022-06-01 (×2): qty 0.5

## 2022-06-01 MED ORDER — SERTRALINE HCL 100 MG PO TABS: 100.0000 mg | ORAL_TABLET | Freq: Every evening | ORAL | Status: DC

## 2022-06-01 MED ORDER — ROSUVASTATIN CALCIUM 20 MG PO TABS: 40.0000 mg | ORAL_TABLET | Freq: Every evening | ORAL | Status: DC

## 2022-06-01 MED ORDER — INSULIN ASPART 100 UNIT/ML FLEXPEN: 10.0000 [IU] | PEN_INJECTOR | Freq: Three times a day (TID) | SUBCUTANEOUS | Status: DC | PRN

## 2022-06-01 MED ORDER — ALBUTEROL SULFATE (2.5 MG/3ML) 0.083% IN NEBU: 2.5000 mg | INHALATION_SOLUTION | Freq: Four times a day (QID) | RESPIRATORY_TRACT | Status: DC | PRN

## 2022-06-01 MED ORDER — RANOLAZINE ER 500 MG PO TB12
500.0000 mg | ORAL_TABLET | Freq: Two times a day (BID) | ORAL | Status: DC
  Administered 2022-06-01 (×2): 500 mg via ORAL
  Filled 2022-06-01 (×2): qty 1

## 2022-06-01 MED ORDER — LEVOTHYROXINE SODIUM 50 MCG PO TABS
50.0000 ug | ORAL_TABLET | Freq: Every day | ORAL | Status: DC
  Administered 2022-06-01: 50 ug via ORAL
  Filled 2022-06-01: qty 1

## 2022-06-01 MED ORDER — ALBUTEROL SULFATE HFA 108 (90 BASE) MCG/ACT IN AERS: 2.0000 | INHALATION_SPRAY | Freq: Four times a day (QID) | RESPIRATORY_TRACT | Status: DC | PRN

## 2022-06-01 MED ORDER — MIRTAZAPINE 7.5 MG PO TABS
7.5000 mg | ORAL_TABLET | Freq: Every day | ORAL | Status: DC
  Administered 2022-06-01: 7.5 mg via ORAL
  Filled 2022-06-01: qty 1

## 2022-06-01 MED ORDER — OXYCODONE-ACETAMINOPHEN 5-325 MG PO TABS
1.0000 | ORAL_TABLET | Freq: Four times a day (QID) | ORAL | Status: DC | PRN
  Administered 2022-06-01 (×2): 1 via ORAL
  Filled 2022-06-01 (×2): qty 1

## 2022-06-01 NOTE — Discharge Summary (Incomplete)
Discharge Summary  Patient ID: Robin Brewer [id number] 76 y.o. [birth year]  Admit date: 05/31/2022  Discharge date and time: No discharge date for patient encounter.   Admitting Physician: [name], MD   Discharge Physician: Macie Burows, MD  Admission Diagnoses: Critical limb ischemia of left lower extremity [facility]) [I70.222]  Discharge Diagnoses: Critical limb ischemia  Admission Condition: poor  Discharged Condition: fair  Indication for Admission: Patient is a 76 year old female originally seen by my partner Dr. Stanford Breed with left lower extremity [place] 5 critical limb ischemia.  Comorbidities include diabetes and CKD 5.  After discussing the risks and benefits of left lower extremity angiogram in an effort to define and improve distal perfusion for wound healing, Robin Brewer elected to proceed.  Hospital Course: Robin Brewer was admitted on 05/31/22 and underwent Aortogram, left lower extremity arteriogram by Dr. Virl Cagey.  She was found to have No flow-limiting stenosis within the aortoiliac segment bilaterally. 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  The popliteal artery was patent without significant stenosis.  Distally, there was no outflow to the foot.  The anterior tibial artery occluded after 3 cm giving off several collaterals.  The peroneal artery occluded in the mid tibia and the posterior tibial artery was not appreciated.  Minimal opacification in the foot. She tolerated the procedure well and was taken to the recovery room instable condition. Dr. Virl Cagey had long discussion with patient and her family the findings and unfortunately her only option will be amputation if her symptoms become unbearable.   By POD#1, she remained stable for discharge home. She will continue all home medications as prescribed. She is a patient at pain management so they will  continue her outpatient pain regimen. She will follow up with Dr. Stanford Breed as needed if she has worsening rest pain or tissue loss to further discuss proceeding with amputation.   Consults: None  Treatments: analgesia: {:18252}, anticoagulation: {:18260}, and surgery: ***   Disposition: Discharge disposition: 01-Home or Self Care       Patient Instructions:  Allergies as of 06/01/2022       Reactions   Aspirin    Upset stomach    Niacin And Related Hives        Medication List     TAKE these medications    albuterol 108 (90 Base) MCG/ACT inhaler Commonly known as: VENTOLIN HFA Inhale 2 puffs into the lungs every 6 (six) hours as needed for wheezing.   apixaban 5 MG Tabs tablet Commonly known as: ELIQUIS Take 5 mg by mouth 2 (two) times daily.   calcitRIOL 0.25 MCG capsule Commonly known as: ROCALTROL Take 0.25 mcg by mouth 2 (two) times daily.   furosemide 80 MG tablet Commonly known as: LASIX Take 80 mg by mouth daily.   labetalol 300 MG tablet Commonly known as: NORMODYNE Take 150 mg by mouth 2 (two) times daily.   Levemir FlexTouch 100 UNIT/ML FlexPen Generic drug: insulin detemir Inject 50 Units into the skin at bedtime.   levocetirizine 5 MG tablet Commonly known as: XYZAL Take 5 mg by mouth every evening.   levothyroxine 50 MCG tablet Commonly known as: SYNTHROID Take 50 mcg by mouth daily before breakfast.   mirtazapine 15 MG tablet Commonly known as: REMERON Take 7.5 mg by mouth at bedtime.   mupirocin ointment 2 % Commonly known as: BACTROBAN Apply 1 application  topically daily.   NovoLOG FlexPen 100 UNIT/ML  FlexPen Generic drug: insulin aspart Inject 10-12 Units into the skin 3 (three) times daily as needed for high blood sugar.   omega-3 acid ethyl esters 1 g capsule Commonly known as: LOVAZA Take 1 g by mouth 2 (two) times daily.   omeprazole 20 MG capsule Commonly known as: PRILOSEC Take 20 mg by mouth daily.    oxyCODONE-acetaminophen 5-325 MG tablet Commonly known as: PERCOCET/ROXICET Take 1 tablet by mouth every 6 (six) hours as needed for severe pain. What changed: when to take this   ranolazine 500 MG 12 hr tablet Commonly known as: RANEXA Take 500 mg by mouth 2 (two) times daily.   rosuvastatin 40 MG tablet Commonly known as: CRESTOR Take 40 mg by mouth every evening.   sertraline 100 MG tablet Commonly known as: ZOLOFT Take 100 mg by mouth every evening.               Discharge Care Instructions  (From admission, onward)           Start     Ordered   06/01/22 0000  Discharge wound care:       Comments: You can remove right groin dressings in the evening on 06/01/22. Wash area with mild soap and water, pat dry. Do not soak in bathtub   06/01/22 0813           Activity: activity as tolerated, no driving while on analgesics, and no heavy lifting for 1 weeks Diet: diabetic diet and low fat, low cholesterol diet Wound Care:  remove dressings pod#1 in the evening. Clean with mild soap and water, pat dry. Do not soak in bathtub  Follow-up with with Dr. Stanford Breed as needed if progression of rest pain or left foot wounds.   SignedKaroline Caldwell, PA-C 06/01/2022 8:13 AM [facility] Office: [phone]

## 2022-06-01 NOTE — Plan of Care (Signed)
  Problem: Cardiovascular: Goal: Vascular access site(s) Level 0-1 will be maintained Outcome: Progressing   Problem: Clinical Measurements: Goal: Respiratory complications will improve Outcome: Progressing   Problem: Coping: Goal: Level of anxiety will decrease Outcome: Progressing   Problem: Pain Managment: Goal: General experience of comfort will improve Outcome: Progressing   Problem: Safety: Goal: Ability to remain free from injury will improve Outcome: Progressing

## 2022-06-01 NOTE — Progress Notes (Addendum)
  Progress Note    06/01/2022 7:22 AM 1 Day Post-Op  Subjective:  no major complaints. Sitting up eating breakfast. Left foot sore but unchanged   Vitals:   06/01/22 0415 06/01/22 0721  BP: (!) 122/101 (!) 143/66  Pulse:  (!) 115  Resp: 15 15  Temp: 98.7 F (37.1 C) 98.7 F (37.1 C)  SpO2: 100% 100%   Physical Exam: Cardiac: tachy Lungs:  non labored Extremities:  right groin access site c/d/I without swelling or hematoma.Left femoral pulse 2+ no palpable distal pulses in left leg. Left foot warm. Left GT dressed. Toes dusky. Motor and sensation intact Abdomen:  soft Neurologic: alert and oriented  CBC    Component Value Date/Time   WBC 8.2 05/31/2022 2109   RBC 2.14 (L) 05/31/2022 2109   HGB 9.1 (L) 06/01/2022 0556   HGB 11.4 12/16/2018 1508   HCT 28.2 (L) 06/01/2022 0556   HCT 33.5 (L) 12/16/2018 1508   PLT 134 (L) 05/31/2022 2109   PLT 174 12/16/2018 1508   MCV 94.4 05/31/2022 2109   MCV 82 12/16/2018 1508   MCH 29.9 05/31/2022 2109   MCHC 31.7 05/31/2022 2109   RDW 13.8 05/31/2022 2109   RDW 13.2 12/16/2018 1508   LYMPHSABS 1.0 08/17/2013 0432   MONOABS 0.6 08/17/2013 0432   EOSABS 0.3 08/17/2013 0432   BASOSABS 0.0 08/17/2013 0432    BMET    Component Value Date/Time   NA 139 05/31/2022 0946   NA 140 01/05/2019 1333   K 4.0 05/31/2022 0946   CL 104 05/31/2022 0946   CO2 21 01/05/2019 1333   GLUCOSE 126 (H) 05/31/2022 0946   BUN 86 (H) 05/31/2022 0946   BUN 31 (H) 01/05/2019 1333   CREATININE 7.30 (H) 05/31/2022 0946   CALCIUM 7.5 (L) 01/05/2019 1333   GFRNONAA 18 (L) 01/05/2019 1333   GFRAA 21 (L) 01/05/2019 1333    INR    Component Value Date/Time   INR 1.15 08/16/2013 0725     Intake/Output Summary (Last 24 hours) at 06/01/2022 0722 Last data filed at 06/01/2022 0422 Gross per 24 hour  Intake 841.33 ml  Output 250 ml  Net 591.33 ml     Assessment/Plan:  76 y.o. female is s/p Angiogram LLE 1 Day Post-Op   No revascularization  options. No outflow into left foot Continues to have manageable rest pain and wounds on left 1st and 2nd toes. Stable wounds Right groin access site c/d/I without swelling or hematoma Patient and her husband understand that should her rest pain become unmanageable or worsening of her wounds she will unfortunately require amputation She currently is patient at pain management so they will continue her pain regimen She will continue statin therapy. Unable to tolerate Diona Fanti She is stable for discharge home today Will arrange close outpatient follow up in 1-2 weeks   Karoline Caldwell, [facility] [phone] 06/01/2022 7:22 AM  I had along discussion with family yesterday and this morning.  Pt can follow up PRN with Dr. Stanford Breed once she chooses to pursue above knee amputation  Groin soft.  Hgb normal, 6.3 reading likely spurius yesterday.    Home.  [name]

## 2022-06-01 NOTE — Progress Notes (Signed)
   06/01/22 0056  Assess: MEWS Score  Temp 98.8 F (37.1 C)  BP 132/80  ECG Heart Rate (!) 113  Resp 14  SpO2 100 %  O2 Device Room Air  Assess: MEWS Score  MEWS Temp 0  MEWS Systolic 0  MEWS Pulse 2  MEWS RR 0  MEWS LOC 0  MEWS Score 2  MEWS Score Color Yellow  Assess: if the MEWS score is Yellow or Red  Were vital signs taken at a resting state? Yes  Focused Assessment No change from prior assessment  Does the patient meet 2 or more of the SIRS criteria? No  MEWS guidelines implemented *See Row Information* Yes  Treat  MEWS Interventions Other (Comment);Administered scheduled meds/treatments (new admit- normal rate for pt per spouse)  Pain Scale 0-10  Pain Score 8  Pain Type Chronic pain  Pain Location Foot  Pain Orientation Left  Pain Intervention(s) Medication (See eMAR)  Take Vital Signs  Increase Vital Sign Frequency  Yellow: Q 2hr X 2 then Q 4hr X 2, if remains yellow, continue Q 4hrs  Escalate  MEWS: Escalate Yellow: discuss with charge nurse/RN and consider discussing with provider and RRT  Notify: Charge Nurse/RN  Name of Charge Nurse/RN Notified Tanya, RN  Date Charge Nurse/RN Notified 06/01/22  Time Charge Nurse/RN Notified 0100  Document  Patient Outcome Other (Comment)  Progress note created (see row info) Yes  Assess: SIRS CRITERIA  SIRS Temperature  0  SIRS Pulse 1  SIRS Respirations  0  SIRS WBC 0  SIRS Score Sum  1   Patient from short stay-patient HR ranging from 110-120 since admission.  Patient in afib.

## 2022-06-01 NOTE — TOC Transition Note (Signed)
Transition of Care [facility]) - CM/SW Discharge Note   Patient Details  Name: Robin Brewer MRN: [id number] Date of Birth: 02-19-1946  Transition of Care [facility]) CM/SW Contact:  Angelita Ingles, RN Phone Number:[phone]  06/01/2022, 10:03 AM   Clinical Narrative:    Patient with discharge orders. No TOC needs.          Patient Goals and CMS Choice        Discharge Placement                       Discharge Plan and Services                                     Social Determinants of Health (SDOH) Interventions     Readmission Risk Interventions     No data to display

## 2022-06-01 NOTE — Discharge Instructions (Signed)
  [facility]  Discharge Instructions  Lower Extremity Angiogram; Angioplasty/Stenting  Please refer to the following instructions for your post-procedure care. Your surgeon or physician assistant will discuss any changes with you.  Activity  Avoid lifting more than 8 pounds (1 gallons of milk) for 5 days after your procedure. You may walk as much as you can tolerate. It's OK to drive after 72 hours.  Bathing/Showering  You may shower the day after your procedure. If you have a bandage, you may remove it at 24- 48 hours. Clean your incision site with mild soap and water. Pat the area dry with a clean towel.  Diet  Resume your pre-procedure diet. There are no special food restrictions following this procedure. All patients with peripheral vascular disease should follow a low fat/low cholesterol diet. In order to heal from your surgery, it is CRITICAL to get adequate nutrition. Your body requires vitamins, minerals, and protein. Vegetables are the best source of vitamins and minerals. Vegetables also provide the perfect balance of protein. Processed food has little nutritional value, so try to avoid this.  Medications  Resume taking all of your medications unless your doctor tells you not to. If your incision is causing pain, you may take over-the-counter pain relievers such as acetaminophen (Tylenol)  Follow Up  Follow up will be arranged at the time of your procedure. You may have an office visit scheduled or may be scheduled for surgery. Ask your surgeon if you have any questions.  Please call us immediately for any of the following conditions: Severe or worsening pain your legs or feet at rest or with walking. Increased pain, redness, drainage at your groin puncture site. Fever of 101 degrees or higher. If you have any mild or slow bleeding from your puncture site: lie down, apply firm constant pressure over the area with a piece of gauze or a clean  wash cloth for 30 minutes- no peeking!, call 911 right away if you are still bleeding after 30 minutes, or if the bleeding is heavy and unmanageable.  Reduce your risk factors of vascular disease:  Stop smoking. If you would like help call [facility] at 1-800-QUIT-NOW (225) 134-1380) or [place] at [phone]. Manage your cholesterol Maintain a desired weight Control your diabetes Keep your blood pressure down  If you have any questions, please call the office at [phone]

## 2022-06-02 LAB — TYPE AND SCREEN
ABO/RH(D): O POS
Antibody Screen: NEGATIVE
Unit division: 0
Unit division: 0

## 2022-06-02 LAB — BPAM RBC
Blood Product Expiration Date: 202307182359
Blood Product Expiration Date: 202307182359
ISSUE DATE / TIME: 202306160051
Unit Type and Rh: 5100
Unit Type and Rh: 5100

## 2022-06-06 ENCOUNTER — Encounter (HOSPITAL_COMMUNITY): Payer: Self-pay | Admitting: Vascular Surgery

## 2022-06-08 ENCOUNTER — Encounter (HOSPITAL_COMMUNITY): Payer: Self-pay

## 2022-06-08 DIAGNOSIS — L97528 Non-pressure chronic ulcer of other part of left foot with other specified severity: Secondary | ICD-10-CM | POA: Diagnosis not present

## 2022-06-08 DIAGNOSIS — I771 Stricture of artery: Secondary | ICD-10-CM | POA: Diagnosis not present

## 2022-07-17 DEATH — deceased
